# Patient Record
Sex: Female | Born: 1985 | Race: Asian | Hispanic: No | Marital: Married | Smoking: Never smoker
Health system: Southern US, Community
[De-identification: ages and names within clinical notes are randomized; demographics above are authoritative.]

## PROBLEM LIST (undated history)

## (undated) ENCOUNTER — Inpatient Hospital Stay (HOSPITAL_COMMUNITY): Payer: Self-pay

## (undated) DIAGNOSIS — H04123 Dry eye syndrome of bilateral lacrimal glands: Secondary | ICD-10-CM

## (undated) DIAGNOSIS — R002 Palpitations: Secondary | ICD-10-CM

## (undated) DIAGNOSIS — F419 Anxiety disorder, unspecified: Secondary | ICD-10-CM

## (undated) DIAGNOSIS — K824 Cholesterolosis of gallbladder: Secondary | ICD-10-CM

## (undated) DIAGNOSIS — K219 Gastro-esophageal reflux disease without esophagitis: Secondary | ICD-10-CM

## (undated) DIAGNOSIS — T7840XA Allergy, unspecified, initial encounter: Secondary | ICD-10-CM

## (undated) DIAGNOSIS — R519 Headache, unspecified: Secondary | ICD-10-CM

## (undated) DIAGNOSIS — Z889 Allergy status to unspecified drugs, medicaments and biological substances status: Secondary | ICD-10-CM

## (undated) DIAGNOSIS — R51 Headache: Secondary | ICD-10-CM

## (undated) HISTORY — DX: Dry eye syndrome of bilateral lacrimal glands: H04.123

## (undated) HISTORY — DX: Palpitations: R00.2

## (undated) HISTORY — DX: Cholesterolosis of gallbladder: K82.4

## (undated) HISTORY — DX: Allergy, unspecified, initial encounter: T78.40XA

## (undated) HISTORY — DX: Gastro-esophageal reflux disease without esophagitis: K21.9

## (undated) HISTORY — DX: Anxiety disorder, unspecified: F41.9

## (undated) HISTORY — PX: NO PAST SURGERIES: SHX2092

## (undated) HISTORY — DX: Headache, unspecified: R51.9

## (undated) HISTORY — DX: Headache: R51

## (undated) HISTORY — PX: UPPER GASTROINTESTINAL ENDOSCOPY: SHX188

---

## 2012-03-20 NOTE — L&D Delivery Note (Signed)
Delivery Note  After pt rcv'd epidural, continued to c/o some pain and pressure, FHR mild early variables noted, cat 2, cervix complete and vtx +2, pt pushed well for about 15 min  At 10:53 PM a viable female was delivered via Vaginal, Spontaneous Delivery (Presentation: ; Occiput Anterior).  Delivered through loose nuchal cord, APGAR: 8, 9; weight 7 lb 1.6 oz (3221 g).   Placenta status: Intact, Spontaneous.  Cord: 3 vessels with the following complications: None.  Cord pH: n/a   Anesthesia: Epidural  Episiotomy: None Lacerations: 2nd degree Suture Repair: 3.0 vicryl rapide 4-0 monocryl  Est. Blood Loss (mL): 300  Mom to postpartum.  Baby to Couplet care / Skin to Skin. Pt plans to breast and bottle feed Declines circumcision at this time Routine PP orders   Vallory Oetken M 02/27/2013, 1:45 AM

## 2012-07-24 ENCOUNTER — Encounter (HOSPITAL_COMMUNITY): Payer: Self-pay | Admitting: *Deleted

## 2012-07-24 ENCOUNTER — Inpatient Hospital Stay (HOSPITAL_COMMUNITY)
Admission: AD | Admit: 2012-07-24 | Discharge: 2012-07-24 | Disposition: A | Discharge status: Home / Self Care | Payer: BC Managed Care – PPO | Source: Ambulatory Visit | Attending: Obstetrics & Gynecology | Admitting: Obstetrics & Gynecology

## 2012-07-24 ENCOUNTER — Inpatient Hospital Stay (HOSPITAL_COMMUNITY): Payer: BC Managed Care – PPO

## 2012-07-24 DIAGNOSIS — R109 Unspecified abdominal pain: Secondary | ICD-10-CM | POA: Insufficient documentation

## 2012-07-24 DIAGNOSIS — O209 Hemorrhage in early pregnancy, unspecified: Secondary | ICD-10-CM | POA: Insufficient documentation

## 2012-07-24 DIAGNOSIS — O418X10 Other specified disorders of amniotic fluid and membranes, first trimester, not applicable or unspecified: Secondary | ICD-10-CM

## 2012-07-24 DIAGNOSIS — O26859 Spotting complicating pregnancy, unspecified trimester: Secondary | ICD-10-CM

## 2012-07-24 DIAGNOSIS — R1084 Generalized abdominal pain: Secondary | ICD-10-CM

## 2012-07-24 DIAGNOSIS — Z3490 Encounter for supervision of normal pregnancy, unspecified, unspecified trimester: Secondary | ICD-10-CM

## 2012-07-24 HISTORY — DX: Allergy status to unspecified drugs, medicaments and biological substances: Z88.9

## 2012-07-24 LAB — URINALYSIS, ROUTINE W REFLEX MICROSCOPIC
Bilirubin Urine: NEGATIVE
Nitrite: NEGATIVE
Protein, ur: NEGATIVE mg/dL
Specific Gravity, Urine: 1.02 (ref 1.005–1.030)
Urobilinogen, UA: 1 mg/dL (ref 0.0–1.0)

## 2012-07-24 LAB — CBC
Hemoglobin: 12.7 g/dL (ref 12.0–15.0)
MCH: 29.4 pg (ref 26.0–34.0)
Platelets: 266 10*3/uL (ref 150–400)
RBC: 4.32 MIL/uL (ref 3.87–5.11)
WBC: 9.7 10*3/uL (ref 4.0–10.5)

## 2012-07-24 LAB — WET PREP, GENITAL: Yeast Wet Prep HPF POC: NONE SEEN

## 2012-07-24 LAB — HCG, QUANTITATIVE, PREGNANCY: hCG, Beta Chain, Quant, S: 100868 m[IU]/mL — ABNORMAL HIGH (ref <5)

## 2012-07-24 LAB — ABO/RH: ABO/RH(D): AB POS

## 2012-07-24 NOTE — MAU Note (Signed)
No period for 2 months. Past 3-4 days, having lower abdominal cramping and very light bleeding.

## 2012-07-24 NOTE — MAU Provider Note (Signed)
History     CSN: [id number]  Arrival date and time: 07/24/12 7829   First Provider Initiated Contact with Patient 07/24/12 [phone]      Chief Complaint  Patient presents with  . Abdominal Cramping  . Vaginal Bleeding   HPI [name] is a 27 y.o. G2P0010 at [redacted]w[redacted]d who presents to MAU today with complaint of vaginal bleeding and lower abdominal cramping. The patient states that she has been having spotting and lower abdominal cramping x 3-4 days. She is also having a lot of thin, white discharge. She denies any other problems.   OB History   Grav Para Term Preterm Abortions TAB SAB Ect Mult Living   2    1  1    0      Past Medical History  Diagnosis Date  . H/O seasonal allergies     History reviewed. No pertinent past surgical history.  History reviewed. No pertinent family history.  History  Substance Use Topics  . Smoking status: Never Smoker   . Smokeless tobacco: Never Used  . Alcohol Use: Yes     Comment: occas    Allergies: Allergies not on file  No prescriptions prior to admission    Review of Systems  Constitutional: Negative for fever.  Gastrointestinal: Positive for abdominal pain. Negative for nausea and vomiting.  Genitourinary:       + vaginal bleeding, discharge   Physical Exam   Blood pressure 109/59, pulse 82, temperature 97.6 F (36.4 C), temperature source Oral, resp. rate 20, height 5' (1.524 m), weight 136 lb (61.689 kg), last menstrual period 05/21/2012.  Physical Exam  Constitutional: She is oriented to person, place, and time. She appears well-developed and well-nourished. No distress.  HENT:  Head: Normocephalic and atraumatic.  Cardiovascular: Normal rate.   Respiratory: Effort normal.  GI: Soft. She exhibits no distension and no mass. There is no tenderness. There is no rebound and no guarding.  Genitourinary: There is no rash or tenderness on the right labia. There is no rash or tenderness on the left labia. Uterus is  enlarged (appropriate for GA). Uterus is not tender. Cervix exhibits discharge (thin, white discharge noted at the cervical os). Cervix exhibits no motion tenderness and no friability. Right adnexum displays no mass and no tenderness. Left adnexum displays no mass and no tenderness. No bleeding around the vagina. Vaginal discharge (moderate amount of thin, white discharge noted in the vaginal vault) found.  Neurological: She is alert and oriented to person, place, and time.  Skin: Skin is warm and dry. No erythema.  Psychiatric: She has a normal mood and affect.   Results for orders placed during the hospital encounter of 07/24/12 (from the past 24 hour(s))  URINALYSIS, ROUTINE W REFLEX MICROSCOPIC     Status: None   Collection Time    07/24/12  8:46 AM      Result Value Range   Color, Urine YELLOW  YELLOW   APPearance CLEAR  CLEAR   Specific Gravity, Urine 1.020  1.005 - 1.030   pH 8.0  5.0 - 8.0   Glucose, UA NEGATIVE  NEGATIVE mg/dL   Hgb urine dipstick NEGATIVE  NEGATIVE   Bilirubin Urine NEGATIVE  NEGATIVE   Ketones, ur NEGATIVE  NEGATIVE mg/dL   Protein, ur NEGATIVE  NEGATIVE mg/dL   Urobilinogen, UA 1.0  0.0 - 1.0 mg/dL   Nitrite NEGATIVE  NEGATIVE   Leukocytes, UA NEGATIVE  NEGATIVE  POCT PREGNANCY, URINE  Status: Abnormal   Collection Time    07/24/12  8:53 AM      Result Value Range   Preg Test, Ur POSITIVE (*) NEGATIVE  WET PREP, GENITAL     Status: Abnormal   Collection Time    07/24/12  9:15 AM      Result Value Range   Yeast Wet Prep HPF POC NONE SEEN  NONE SEEN   Trich, Wet Prep NONE SEEN  NONE SEEN   Clue Cells Wet Prep HPF POC NONE SEEN  NONE SEEN   WBC, Wet Prep HPF POC FEW (*) NONE SEEN  CBC     Status: None   Collection Time    07/24/12  9:15 AM      Result Value Range   WBC 9.7  4.0 - 10.5 K/uL   RBC 4.32  3.87 - 5.11 MIL/uL   Hemoglobin 12.7  12.0 - 15.0 g/dL   HCT 45.4  09.8 - 11.9 %   MCV 86.3  78.0 - 100.0 fL   MCH 29.4  26.0 - 34.0 pg   MCHC  34.0  30.0 - 36.0 g/dL   RDW 14.7  82.9 - 56.2 %   Platelets 266  150 - 400 K/uL  ABO/RH     Status: None   Collection Time    07/24/12  9:15 AM      Result Value Range   ABO/RH(D) AB POS    HCG, QUANTITATIVE, PREGNANCY     Status: Abnormal   Collection Time    07/24/12  9:15 AM      Result Value Range   hCG, Beta Chain, Quant, S 130865 (*) <5 mIU/mL    MAU Course  Procedures None  MDM Wet prep, GC/Chlamydia, CBC, ABO/Rh, quant hCG and [place] today  Assessment and Plan  A: IUP at 9w 1 d Small subchorionic hemorrhage  P: Discharge home Advised patient to start taking prenatal vitamins Patient plans to start prenatal care with [facility] soon. Will call and make an appointment today Patient may return to MAU as needed or if her condition were to change or worsen  Freddi Starr, PA-C  07/24/2012, 10:20 AM

## 2012-07-25 LAB — GC/CHLAMYDIA PROBE AMP: CT Probe RNA: NEGATIVE

## 2012-08-05 LAB — OB RESULTS CONSOLE ABO/RH: RH Type: POSITIVE

## 2012-08-05 LAB — OB RESULTS CONSOLE RPR: RPR: NONREACTIVE

## 2012-08-05 LAB — OB RESULTS CONSOLE HIV ANTIBODY (ROUTINE TESTING): HIV: NONREACTIVE

## 2012-08-05 LAB — OB RESULTS CONSOLE GC/CHLAMYDIA
Chlamydia: NEGATIVE
Gonorrhea: NEGATIVE

## 2013-02-03 LAB — OB RESULTS CONSOLE GBS: GBS: NEGATIVE

## 2013-02-26 ENCOUNTER — Inpatient Hospital Stay (HOSPITAL_COMMUNITY)
Admission: AD | Admit: 2013-02-26 | Discharge: 2013-02-28 | DRG: 775 | Disposition: A | Discharge status: Home / Self Care | Payer: BC Managed Care – PPO | Source: Ambulatory Visit | Attending: Obstetrics and Gynecology | Admitting: Obstetrics and Gynecology

## 2013-02-26 ENCOUNTER — Inpatient Hospital Stay (HOSPITAL_COMMUNITY): Payer: BC Managed Care – PPO

## 2013-02-26 ENCOUNTER — Inpatient Hospital Stay (HOSPITAL_COMMUNITY): Payer: BC Managed Care – PPO | Admitting: Anesthesiology

## 2013-02-26 ENCOUNTER — Encounter (HOSPITAL_COMMUNITY): Payer: Self-pay | Admitting: *Deleted

## 2013-02-26 ENCOUNTER — Encounter (HOSPITAL_COMMUNITY): Payer: BC Managed Care – PPO | Admitting: Anesthesiology

## 2013-02-26 LAB — CBC
HCT: 37.2 % (ref 36.0–46.0)
Hemoglobin: 12.5 g/dL (ref 12.0–15.0)
MCV: 85.7 fL (ref 78.0–100.0)
Platelets: 242 10*3/uL (ref 150–400)
RDW: 13.4 % (ref 11.5–15.5)
WBC: 13.9 10*3/uL — ABNORMAL HIGH (ref 4.0–10.5)

## 2013-02-26 LAB — AMNISURE RUPTURE OF MEMBRANE (ROM) NOT AT ARMC: Amnisure ROM: POSITIVE

## 2013-02-26 LAB — TYPE AND SCREEN: ABO/RH(D): AB POS

## 2013-02-26 MED ORDER — OXYCODONE-ACETAMINOPHEN 5-325 MG PO TABS: 1.0000 | ORAL_TABLET | ORAL | Status: DC | PRN

## 2013-02-26 MED ORDER — EPHEDRINE 5 MG/ML INJ
10.0000 mg | INTRAVENOUS | Status: DC | PRN
  Filled 2013-02-26: qty 2

## 2013-02-26 MED ORDER — OXYTOCIN BOLUS FROM INFUSION
500.0000 mL | INTRAVENOUS | Status: DC
  Administered 2013-02-26: 500 mL via INTRAVENOUS

## 2013-02-26 MED ORDER — LACTATED RINGERS IV SOLN
INTRAVENOUS | Status: DC
  Administered 2013-02-26 (×2): via INTRAVENOUS

## 2013-02-26 MED ORDER — LACTATED RINGERS IV SOLN: 500.0000 mL | INTRAVENOUS | Status: DC | PRN

## 2013-02-26 MED ORDER — OXYTOCIN 40 UNITS IN LACTATED RINGERS INFUSION - SIMPLE MED: 62.5000 mL/h | INTRAVENOUS | Status: DC

## 2013-02-26 MED ORDER — EPHEDRINE 5 MG/ML INJ
10.0000 mg | INTRAVENOUS | Status: DC | PRN
  Filled 2013-02-26: qty 4
  Filled 2013-02-26: qty 2

## 2013-02-26 MED ORDER — OXYTOCIN 40 UNITS IN LACTATED RINGERS INFUSION - SIMPLE MED
1.0000 m[IU]/min | INTRAVENOUS | Status: DC
  Administered 2013-02-26: 9 m[IU]/min via INTRAVENOUS
  Administered 2013-02-26: 1 m[IU]/min via INTRAVENOUS
  Administered 2013-02-26: 7 m[IU]/min via INTRAVENOUS
  Filled 2013-02-26: qty 1000

## 2013-02-26 MED ORDER — PHENYLEPHRINE 40 MCG/ML (10ML) SYRINGE FOR IV PUSH (FOR BLOOD PRESSURE SUPPORT)
80.0000 ug | PREFILLED_SYRINGE | INTRAVENOUS | Status: DC | PRN
  Filled 2013-02-26: qty 10
  Filled 2013-02-26: qty 2

## 2013-02-26 MED ORDER — CITRIC ACID-SODIUM CITRATE 334-500 MG/5ML PO SOLN: 30.0000 mL | ORAL | Status: DC | PRN

## 2013-02-26 MED ORDER — BUTORPHANOL TARTRATE 1 MG/ML IJ SOLN
1.0000 mg | INTRAMUSCULAR | Status: DC | PRN
  Administered 2013-02-26: 1 mg via INTRAVENOUS
  Filled 2013-02-26: qty 1

## 2013-02-26 MED ORDER — ONDANSETRON HCL 4 MG/2ML IJ SOLN: 4.0000 mg | Freq: Four times a day (QID) | INTRAMUSCULAR | Status: DC | PRN

## 2013-02-26 MED ORDER — ACETAMINOPHEN 325 MG PO TABS: 650.0000 mg | ORAL_TABLET | ORAL | Status: DC | PRN

## 2013-02-26 MED ORDER — LIDOCAINE HCL (PF) 1 % IJ SOLN
INTRAMUSCULAR | Status: DC | PRN
  Administered 2013-02-26 (×4): 4 mL

## 2013-02-26 MED ORDER — PHENYLEPHRINE 40 MCG/ML (10ML) SYRINGE FOR IV PUSH (FOR BLOOD PRESSURE SUPPORT)
80.0000 ug | PREFILLED_SYRINGE | INTRAVENOUS | Status: DC | PRN
  Filled 2013-02-26: qty 2

## 2013-02-26 MED ORDER — LACTATED RINGERS IV SOLN
500.0000 mL | Freq: Once | INTRAVENOUS | Status: AC
  Administered 2013-02-26: 500 mL via INTRAVENOUS

## 2013-02-26 MED ORDER — LIDOCAINE HCL (PF) 1 % IJ SOLN
30.0000 mL | INTRAMUSCULAR | Status: DC | PRN
  Administered 2013-02-26: 30 mL via SUBCUTANEOUS
  Filled 2013-02-26 (×2): qty 30

## 2013-02-26 MED ORDER — IBUPROFEN 600 MG PO TABS: 600.0000 mg | ORAL_TABLET | Freq: Four times a day (QID) | ORAL | Status: DC | PRN

## 2013-02-26 MED ORDER — DIPHENHYDRAMINE HCL 50 MG/ML IJ SOLN: 12.5000 mg | INTRAMUSCULAR | Status: DC | PRN

## 2013-02-26 MED ORDER — FENTANYL 2.5 MCG/ML BUPIVACAINE 1/10 % EPIDURAL INFUSION (WH - ANES)
14.0000 mL/h | INTRAMUSCULAR | Status: DC | PRN
  Administered 2013-02-26: 14 mL/h via EPIDURAL
  Filled 2013-02-26: qty 125

## 2013-02-26 MED ORDER — TERBUTALINE SULFATE 1 MG/ML IJ SOLN: 0.2500 mg | Freq: Once | INTRAMUSCULAR | Status: AC | PRN

## 2013-02-26 NOTE — H&P (Signed)
Monica Steele is a 27 y.o. female presenting for bleeding and contractions. History   HPI Pt presents c/o bleeding and  Some contractions.  She was 1-2cm when checked by RN in MAU.  Pt was sent walking for 2 hrs and to be rechecked.  Upon her return she thought she had been leaking fluid.  Good FM reported.  OB History   Grav Para Term Preterm Abortions TAB SAB Ect Mult Living   2    1  1    0      Past Medical History  Diagnosis Date  . H/O seasonal allergies     History reviewed. No pertinent past surgical history.  History reviewed. No pertinent family history.  History  Substance Use Topics  . Smoking status: Never Smoker   . Smokeless tobacco: Never Used  . Alcohol Use: Yes     Comment: occas    Allergies: No Known Allergies  Prescriptions prior to admission  Medication Sig Dispense Refill  . Prenatal Vit-Fe Fumarate-FA (PRENATAL MULTIVITAMIN) TABS tablet Take 1 tablet by mouth daily at 12 noon.        OB History   Grav Para Term Preterm Abortions TAB SAB Ect Mult Living   2    1  1    0     Past Medical History  Diagnosis Date  . H/O seasonal allergies    History reviewed. No pertinent past surgical history. Family History: family history is not on file. Social History:  reports that she has never smoked. She has never used smokeless tobacco. She reports that she drinks alcohol. She reports that she does not use illicit drugs.   Prenatal Transfer Tool  Maternal Diabetes: No Genetic Screening: Normal Maternal Ultrasounds/Referrals: Normal Fetal Ultrasounds or other Referrals:  None Maternal Substance Abuse:  No Significant Maternal Medications:  None Significant Maternal Lab Results:  Lab values include: Group B Strep negative Other Comments:  None  ROS Non-contributory  Dilation: 1.5 Effacement (%): 60 Station: -3 Exam by:: Dr. Su Hilt Blood pressure 118/69, pulse 75, temperature 98.2 F (36.8 C), temperature source Oral, resp. rate 18, height  5' 0.5" (1.537 m), weight 72.576 kg (160 lb), last menstrual period 05/21/2012. Exam Physical Exam   Lungs CTA CV RRR Ext no calf tenderness Amnisure positive  Prenatal labs: ABO, Rh: --/--/AB POS (05/07 0915) Antibody:   Rubella:  immune RPR:   NR HBsAg:   neg HIV:   neg GBS:   neg  Assessment/Plan: P0 at 40 1/7wks with SROM and early labor.  Will admit with routine orders. Fetal status is reassuring.  U/S still pending.   Monica Steele Y 02/26/2013, 1:34 PM

## 2013-02-26 NOTE — Anesthesia Procedure Notes (Signed)
Epidural Patient location during procedure: OB Start time: 02/26/2013 9:44 PM  Staffing Performed by: anesthesiologist   Preanesthetic Checklist Completed: patient identified, site marked, surgical consent, pre-op evaluation, timeout performed, IV checked, risks and benefits discussed and monitors and equipment checked  Epidural Patient position: sitting Prep: site prepped and draped and DuraPrep Patient monitoring: continuous pulse ox and blood pressure Approach: midline Injection technique: LOR air  Needle:  Needle type: Tuohy  Needle gauge: 17 G Needle length: 9 cm and 9 Needle insertion depth: 5 cm cm Catheter type: closed end flexible Catheter size: 19 Gauge Catheter at skin depth: 10 cm Test dose: negative  Assessment Events: blood not aspirated, injection not painful, no injection resistance, negative IV test and no paresthesia  Additional Notes Discussed risk of headache, infection, bleeding, nerve injury and failed or incomplete block.  Patient voices understanding and wishes to proceed.  Epidural placed easily on first attempt.  No paresthesia.  Patient tolerated procedure well with no apparent complications.  Jasmine December, MDReason for block:procedure for pain

## 2013-02-26 NOTE — MAU Note (Addendum)
States noted some bleeding this AM.States was like a period. Denies recent exam or intercourse.

## 2013-02-26 NOTE — Progress Notes (Signed)
Monica Steele is a 27 y.o. G2P0010 at [redacted]w[redacted]d admitted for rupture of membranes in early labor  Subjective: Pt fairly comfortable in appearance.  Slight language barrier but FOB speaks english.  Objective: BP 118/69  Pulse 75  Temp(Src) 98.4 F (36.9 C) (Oral)  Resp 18  Ht 5' 0.5" (1.537 m)  Wt 72.576 kg (160 lb)  BMI 30.72 kg/m2  LMP 05/21/2012      FHT:  FHR: 130s-140s bpm, variability: moderate,  accelerations:  Present,  decelerations:  Absent UC:   regular, every 5-6 minutes SVE:   Dilation: 2 Effacement (%): 80 Station: -2 Exam by:: Dr.Maeley Matton  Labs: Lab Results  Component Value Date   WBC 13.9* 02/26/2013   HGB 12.5 02/26/2013   HCT 37.2 02/26/2013   MCV 85.7 02/26/2013   PLT 242 02/26/2013    Assessment / Plan: SROM in early labor  Labor: Augment with pitocin.  Discussed with patient and she is agreeable. Preeclampsia:  no signs or symptoms of toxicity Fetal Wellbeing:  Category I Pain Control:  Labor support without medications.  Pain med upon request. I/D:  GBS neg, no signs sxs of infection. Anticipated MOD:  NSVD  Aleighya Mcanelly Y 02/26/2013, 3:50 PM

## 2013-02-26 NOTE — MAU Provider Note (Signed)
  History     CSN: [id number]  Arrival date and time: 02/26/13 4098   None     Chief Complaint  Patient presents with  . Vaginal Bleeding  . Abdominal Pain   HPI Pt presents c/o bleeding and  Some contractions.  She was 1-2cm when checked by RN in MAU.  Pt was sent walking for 2 hrs and to be rechecked.  Upon her return she thought she had been leaking fluid.  Good FM reported.  OB History   Grav Para Term Preterm Abortions TAB SAB Ect Mult Living   2    1  1    0      Past Medical History  Diagnosis Date  . H/O seasonal allergies     History reviewed. No pertinent past surgical history.  History reviewed. No pertinent family history.  History  Substance Use Topics  . Smoking status: Never Smoker   . Smokeless tobacco: Never Used  . Alcohol Use: Yes     Comment: occas    Allergies: No Known Allergies  Prescriptions prior to admission  Medication Sig Dispense Refill  . Prenatal Vit-Fe Fumarate-FA (PRENATAL MULTIVITAMIN) TABS tablet Take 1 tablet by mouth daily at 12 noon.        ROS Physical Exam   Blood pressure 118/69, pulse 75, temperature 98.2 F (36.8 C), temperature source Oral, resp. rate 18, height 5' 0.5" (1.537 m), weight 72.576 kg (160 lb), last menstrual period 05/21/2012.  Physical Exam  ve 1-2/50/-3/vtx FHT 130s-140s cat 1 Toco q5-58min Amnisure done  MAU Course  Procedures Amnisure positive U/S pending   Assessment and Plan  P0 at 40 1/7wks with SROM and early labor.  Will admit with routine orders.   Letrell Attwood Y 02/26/2013, 1:27 PM

## 2013-02-26 NOTE — Anesthesia Preprocedure Evaluation (Signed)

## 2013-02-27 ENCOUNTER — Encounter (HOSPITAL_COMMUNITY): Payer: Self-pay | Admitting: *Deleted

## 2013-02-27 LAB — CBC
HCT: 35 % — ABNORMAL LOW (ref 36.0–46.0)
Hemoglobin: 11.7 g/dL — ABNORMAL LOW (ref 12.0–15.0)
MCHC: 33.4 g/dL (ref 30.0–36.0)
Platelets: 230 10*3/uL (ref 150–400)
RDW: 13.4 % (ref 11.5–15.5)

## 2013-02-27 MED ORDER — BISACODYL 10 MG RE SUPP: 10.0000 mg | Freq: Every day | RECTAL | Status: DC | PRN

## 2013-02-27 MED ORDER — IBUPROFEN 600 MG PO TABS
600.0000 mg | ORAL_TABLET | Freq: Four times a day (QID) | ORAL | Status: DC
  Administered 2013-02-27 – 2013-02-28 (×6): 600 mg via ORAL
  Filled 2013-02-27 (×6): qty 1

## 2013-02-27 MED ORDER — ZOLPIDEM TARTRATE 5 MG PO TABS: 5.0000 mg | ORAL_TABLET | Freq: Every evening | ORAL | Status: DC | PRN

## 2013-02-27 MED ORDER — FLEET ENEMA 7-19 GM/118ML RE ENEM: 1.0000 | ENEMA | Freq: Every day | RECTAL | Status: DC | PRN

## 2013-02-27 MED ORDER — LANOLIN HYDROUS EX OINT: TOPICAL_OINTMENT | CUTANEOUS | Status: DC | PRN

## 2013-02-27 MED ORDER — SENNOSIDES-DOCUSATE SODIUM 8.6-50 MG PO TABS
2.0000 | ORAL_TABLET | ORAL | Status: DC
  Administered 2013-02-28: 2 via ORAL
  Filled 2013-02-27: qty 2

## 2013-02-27 MED ORDER — SIMETHICONE 80 MG PO CHEW: 80.0000 mg | CHEWABLE_TABLET | ORAL | Status: DC | PRN

## 2013-02-27 MED ORDER — WITCH HAZEL-GLYCERIN EX PADS: 1.0000 "application " | MEDICATED_PAD | CUTANEOUS | Status: DC | PRN

## 2013-02-27 MED ORDER — ONDANSETRON HCL 4 MG/2ML IJ SOLN: 4.0000 mg | INTRAMUSCULAR | Status: DC | PRN

## 2013-02-27 MED ORDER — OXYCODONE-ACETAMINOPHEN 5-325 MG PO TABS: 1.0000 | ORAL_TABLET | ORAL | Status: DC | PRN

## 2013-02-27 MED ORDER — MEDROXYPROGESTERONE ACETATE 150 MG/ML IM SUSP: 150.0000 mg | INTRAMUSCULAR | Status: DC | PRN

## 2013-02-27 MED ORDER — PRENATAL MULTIVITAMIN CH
1.0000 | ORAL_TABLET | Freq: Every day | ORAL | Status: DC
  Administered 2013-02-28: 1 via ORAL

## 2013-02-27 MED ORDER — BENZOCAINE-MENTHOL 20-0.5 % EX AERO
1.0000 "application " | INHALATION_SPRAY | CUTANEOUS | Status: DC | PRN
  Administered 2013-02-27: 1 via TOPICAL
  Filled 2013-02-27: qty 56

## 2013-02-27 MED ORDER — ONDANSETRON HCL 4 MG PO TABS: 4.0000 mg | ORAL_TABLET | ORAL | Status: DC | PRN

## 2013-02-27 MED ORDER — DIBUCAINE 1 % RE OINT: 1.0000 "application " | TOPICAL_OINTMENT | RECTAL | Status: DC | PRN

## 2013-02-27 MED ORDER — DIPHENHYDRAMINE HCL 25 MG PO CAPS: 25.0000 mg | ORAL_CAPSULE | Freq: Four times a day (QID) | ORAL | Status: DC | PRN

## 2013-02-27 NOTE — Lactation Note (Signed)
This note was copied from the chart of Monica Steele. Lactation Consultation Note Initial consultation; mom's personal interpreter in room for consult. Mom states baby has had one feeding since born, for about an hour.  Reviewed br feeding basics with mom; demonstrate hand expression with mom return demo. Offered to assist with a feeding, mom accepts. Mom prefers cradle over football; demonstrated how to hold using cross cradle. Assisted mom to get baby in cross cradle on left side. Baby latched well with occasional audible swallows for about 10 minutes then was asleep.  Enc mom to continue frequent STS and cue based feeding. Mom declines STS at this time. Baby swaddled and placed back in the bassinet at mom's request. Mom made aware of lactation services and community resources.  Enc mom to call for assistance if needed.   Patient Name: Monica Merissa Renwick [id number] Date: 02/27/2013 Reason for consult: Initial assessment   Maternal Data Formula Feeding for Exclusion: Yes Reason for exclusion: Mother's choice to formula and breast feed on admission Has patient been taught Hand Expression?: Yes Does the patient have breastfeeding experience prior to this delivery?: No  Feeding Feeding Type: Breast Fed Length of feed: 10 min  LATCH Score/Interventions Latch: Grasps breast easily, tongue down, lips flanged, rhythmical sucking. Intervention(s): Skin to skin;Teach feeding cues  Audible Swallowing: A few with stimulation Intervention(s): Skin to skin Intervention(s): Skin to skin  Type of Nipple: Everted at rest and after stimulation  Comfort (Breast/Nipple): Soft / non-tender     Hold (Positioning): Assistance needed to correctly position infant at breast and maintain latch. Intervention(s): Support Pillows;Position options;Skin to skin  LATCH Score: 8  Lactation Tools Discussed/Used     Consult Status Consult Status: Follow-up Follow-up type: In-patient    Octavio Manns [facility] 02/27/2013, 12:19 PM

## 2013-02-27 NOTE — Anesthesia Postprocedure Evaluation (Signed)
  Anesthesia Post-op Note  Patient: Monica Steele  Procedure(s) Performed: * No procedures listed *  Patient Location: Mother/Baby  Anesthesia Type:Epidural  Level of Consciousness: awake, alert  and oriented  Airway and Oxygen Therapy: Patient Spontanous Breathing  Post-op Pain: none  Post-op Assessment: Post-op Vital signs reviewed and Patient's Cardiovascular Status Stable  Post-op Vital Signs: Reviewed and stable  Complications: No apparent anesthesia complications

## 2013-02-27 NOTE — Progress Notes (Signed)
Patient ID: Monica Steele, female   DOB: 1985/08/07, 27 y.o.   MRN: [id number] Monica Steele is a 27 y.o. G2P1011 at [redacted]w[redacted]d admitted for SROM  Subjective: Just rcv'd stadol IV, sleepy, helped some, family at [facility]   Objective: BP 118/65  Pulse 99  Temp(Src) 98.3 F (36.8 C) (Oral)  Resp 18  Ht 5' 0.5" (1.537 m)  Wt 160 lb (72.576 kg)  BMI 30.72 kg/m2  SpO2 98%  LMP 05/21/2012  Total I/O In: -  Out: 650 [Urine:350; Blood:300]  FHT:  Cat 1 UC:   toco 2-3, some coupling   SVE:   Deferred    Assessment / Plan:  Labor: latent labor, pitocin augmentation in progress Preeclampsia:  no s/s Fetal Wellbeing:  Category I Pain Control:  stadol Anticipated MOD:  NSVD  Continue pitocin, consider IUPC    Update physician PRN   Callaway Hailes M 02/27/2013, 27:42 AM

## 2013-02-27 NOTE — Progress Notes (Signed)
Post Partum Day day 1 Subjective: no complaints, up ad lib, voiding and tolerating PO  Objective: Blood pressure 107/71, pulse 84, temperature 98.7 F (37.1 C), temperature source Oral, resp. rate 18, height 5' 0.5" (1.537 m), weight 160 lb (72.576 kg), last menstrual period 05/21/2012, SpO2 98.00%, unknown if currently breastfeeding.  Physical Exam:  General: alert, cooperative, appears stated age, no distress and Limited English ability Lochia: appropriate Uterine Fundus: firm Incision: Not applicable DVT Evaluation: No evidence of DVT seen on physical exam.   Recent Labs  02/26/13 1450 02/27/13 0609  HGB 12.5 11.7*  HCT 37.2 35.0*    Assessment/Plan: Plan for discharge tomorrow, Breastfeeding and Contraception Undecided   LOS: 1 day   Anique Beckley P 02/27/2013, 3:41 PM   1

## 2013-02-28 MED ORDER — IBUPROFEN 600 MG PO TABS: 600.0000 mg | ORAL_TABLET | Freq: Four times a day (QID) | ORAL | Status: DC

## 2013-02-28 NOTE — Discharge Summary (Signed)
Vaginal Delivery Discharge Summary  Monica Steele  DOB:    Dec 06, 1985 MRN:    [id number] CSN:    [id number]  Date of admission:                  02/26/13  Date of discharge:                   02/28/13  Procedures this admission: SVD with a 2nd degree laceration  Date of Delivery: 02/26/13 by Sanda Klein  Newborn Data:  Live born female  Birth Weight: 7 lb 1.6 oz (3221 g) APGAR: 8, 9  Home with mother. Circumcision Plan: Declines  History of Present Illness:  Monica Steele is a 27 y.o. female, G2P1011, who presents at [redacted]w[redacted]d weeks gestation. The patient has been followed at the [facility] and Gynecology division of [facility]. She was admitted onset of labor and rupture of membranes. Her pregnancy has been complicated by:   Patient Active Problem List   Diagnosis Date Noted  . NSVD (normal spontaneous vaginal delivery) 02/28/2013  . Second-degree perineal laceration, with delivery 02/28/2013     Hospital course:  The patient was admitted for early labor and SROM.   Her labor was not complicated. She proceeded to have a vaginal delivery of a healthy infant. Her delivery was not complicated. Her postpartum course was not complicated.  She was discharged to home on postpartum day 2 doing well.  Feeding:  breast and bottle  Contraception:  no method - Husband is out of the country. Will think about it and will discuss at [facility] appointment.  Offered to answer questions, pt had no questions.  Discharge hemoglobin:  Hemoglobin  Date Value Range Status  02/27/2013 11.7* 12.0 - 15.0 g/dL Final     HCT  Date Value Range Status  02/27/2013 35.0* 36.0 - 46.0 % Final    Discharge Physical Exam:   General: alert, cooperative and no distress Lochia: appropriate Uterine Fundus: firm Incision: healing well DVT Evaluation: No evidence of DVT seen on physical exam. Negative Homan's sign.  Intrapartum Procedures: spontaneous  vaginal delivery Postpartum Procedures: none Complications-Operative and Postpartum: 2nd degree perineal laceration  Discharge Diagnoses: Term Pregnancy-delivered  Discharge Information:  Activity:           pelvic rest Diet:                routine Medications: PNV and Ibuprofen Condition:      stable Instructions:   Postpartum Care After Vaginal Delivery  After you deliver your newborn (postpartum period), the usual stay in the hospital is 24 72 hours. If there were problems with your labor or delivery, or if you have other medical problems, you might be in the hospital longer.  While you are in the hospital, you will receive help and instructions on how to care for yourself and your newborn during the postpartum period.  While you are in the hospital:  Be sure to tell your nurses if you have pain or discomfort, as well as where you feel the pain and what makes the pain worse.  If you had an incision made near your vagina (episiotomy) or if you had some tearing during delivery, the nurses may put ice packs on your episiotomy or tear. The ice packs may help to reduce the pain and swelling.  If you are breastfeeding, you may feel uncomfortable contractions of your uterus for a couple of weeks. This is normal. The  contractions help your uterus get back to normal size.  It is normal to have some bleeding after delivery.  For the first 1 3 days after delivery, the flow is red and the amount may be similar to a period.  It is common for the flow to start and stop.  In the first few days, you may pass some small clots. Let your nurses know if you begin to pass large clots or your flow increases.  Do not  flush blood clots down the toilet before having the nurse look at them.  During the next 3 10 days after delivery, your flow should become more watery and pink or brown-tinged in color.  Ten to fourteen days after delivery, your flow should be a small amount of yellowish-white  discharge.  The amount of your flow will decrease over the first few weeks after delivery. Your flow may stop in 6 8 weeks. Most women have had their flow stop by 12 weeks after delivery.  You should change your sanitary pads frequently.  Wash your hands thoroughly with soap and water for at least 20 seconds after changing pads, using the toilet, or before holding or feeding your newborn.  You should feel like you need to empty your bladder within the first 6 8 hours after delivery.  In case you become weak, lightheaded, or faint, call your nurse before you get out of bed for the first time and before you take a shower for the first time.  Within the first few days after delivery, your breasts may begin to feel tender and full. This is called engorgement. Breast tenderness usually goes away within 48 72 hours after engorgement occurs. You may also notice milk leaking from your breasts. If you are not breastfeeding, do not stimulate your breasts. Breast stimulation can make your breasts produce more milk.  Spending as much time as possible with your newborn is very important. During this time, you and your newborn can feel close and get to know each other. Having your newborn stay in your room (rooming in) will help to strengthen the bond with your newborn. It will give you time to get to know your newborn and become comfortable caring for your newborn.  Your hormones change after delivery. Sometimes the hormone changes can temporarily cause you to feel sad or tearful. These feelings should not last more than a few days. If these feelings last longer than that, you should talk to your caregiver.  If desired, talk to your caregiver about methods of family planning or contraception.  Talk to your caregiver about immunizations. Your caregiver may want you to have the following immunizations before leaving the hospital:  Tetanus, diphtheria, and pertussis (Tdap) or tetanus and diphtheria (Td)  immunization. It is very important that you and your family (including grandparents) or others caring for your newborn are up-to-date with the Tdap or Td immunizations. The Tdap or Td immunization can help protect your newborn from getting ill.  Rubella immunization.  Varicella (chickenpox) immunization.  Influenza immunization. You should receive this annual immunization if you did not receive the immunization during your pregnancy. Document Released: 01/01/2007 Document Revised: 11/29/2011 Document Reviewed: 11/01/2011 [facility] Patient Information 2014 [place], [place].   Postpartum Depression and Baby Blues  The postpartum period begins right after the birth of a baby. During this time, there is often a great amount of joy and excitement. It is also a time of considerable changes in the life of the parent(s). Regardless of how  many times a mother gives birth, each child brings new challenges and dynamics to the family. It is not unusual to have feelings of excitement accompanied by confusing shifts in moods, emotions, and thoughts. All mothers are at risk of developing postpartum depression or the "baby blues." These mood changes can occur right after giving birth, or they may occur many months after giving birth. The baby blues or postpartum depression can be mild or severe. Additionally, postpartum depression can resolve rather quickly, or it can be a long-term condition. CAUSES Elevated hormones and their rapid decline are thought to be a main cause of postpartum depression and the baby blues. There are a number of hormones that radically change during and after pregnancy. Estrogen and progesterone usually decrease immediately after delivering your baby. The level of thyroid hormone and various cortisol steroids also rapidly drop. Other factors that play a major role in these changes include major life events and genetics.  RISK FACTORS If you have any of the following risks for the baby blues  or postpartum depression, know what symptoms to watch out for during the postpartum period. Risk factors that may increase the likelihood of getting the baby blues or postpartum depression include:  Havinga personal or family history of depression.  Having depression while being pregnant.  Having premenstrual or oral contraceptive-associated mood issues.  Having exceptional life stress.  Having marital conflict.  Lacking a social support network.  Having a baby with special needs.  Having health problems such as diabetes. SYMPTOMS Baby blues symptoms include:  Brief fluctuations in mood, such as going from extreme happiness to sadness.  Decreased concentration.  Difficulty sleeping.  Crying spells, tearfulness.  Irritability.  Anxiety. Postpartum depression symptoms typically begin within the first month after giving birth. These symptoms include:  Difficulty sleeping or excessive sleepiness.  Marked weight loss.  Agitation.  Feelings of worthlessness.  Lack of interest in activity or food. Postpartum psychosis is a very concerning condition and can be dangerous. Fortunately, it is rare. Displaying any of the following symptoms is cause for immediate medical attention. Postpartum psychosis symptoms include:  Hallucinations and delusions.  Bizarre or disorganized behavior.  Confusion or disorientation. DIAGNOSIS  A diagnosis is made by an evaluation of your symptoms. There are no medical or lab tests that lead to a diagnosis, but there are various questionnaires that a caregiver may use to identify those with the baby blues, postpartum depression, or psychosis. Often times, a screening tool called the [place] Postnatal Depression Scale is used to diagnose depression in the postpartum period.  TREATMENT The baby blues usually goes away on its own in 1 to 2 weeks. Social support is often all that is needed. You should be encouraged to get adequate sleep and rest.  Occasionally, you may be given medicines to help you sleep.  Postpartum depression requires treatment as it can last several months or longer if it is not treated. Treatment may include individual or group therapy, medicine, or both to address any social, physiological, and psychological factors that may play a role in the depression. Regular exercise, a healthy diet, rest, and social support may also be strongly recommended.  Postpartum psychosis is more serious and needs treatment right away. Hospitalization is often needed. HOME CARE INSTRUCTIONS  Get as much rest as you can. Nap when the baby sleeps.  Exercise regularly. Some women find yoga and walking to be beneficial.  Eat a balanced and nourishing diet.  Do little things that you enjoy. Have  a cup of tea, take a bubble bath, read your favorite magazine, or listen to your favorite music.  Avoid alcohol.  Ask for help with household chores, cooking, grocery shopping, or running errands as needed. Do not try to do everything.  Talk to people close to you about how you are feeling. Get support from your partner, family members, friends, or other new moms.  Try to stay positive in how you think. Think about the things you are grateful for.  Do not spend a lot of time alone.  Only take medicine as directed by your caregiver.  Keep all your postpartum appointments.  Let your caregiver know if you have any concerns. SEEK MEDICAL CARE IF: You are having a reaction or problems with your medicine. SEEK IMMEDIATE MEDICAL CARE IF:  You have suicidal feelings.  You feel you may harm the baby or someone else. Document Released: 12/09/2003 Document Revised: 05/29/2011 Document Reviewed: 01/10/2011 [facility] Patient Information 2014 [place], [place].   Discharge to: home  Follow-up Information   Follow up with [facility]. Schedule an appointment as soon as possible for a visit in 6 weeks. (Call with any  questions or concerns.)    Specialty:  Obstetrics and Gynecology   Contact information:   [address] [place] [address] [phone]       Haroldine Laws 02/28/2013

## 2014-01-19 ENCOUNTER — Encounter (HOSPITAL_COMMUNITY): Payer: Self-pay | Admitting: *Deleted

## 2015-09-14 DIAGNOSIS — L299 Pruritus, unspecified: Secondary | ICD-10-CM | POA: Diagnosis not present

## 2015-09-14 DIAGNOSIS — L0390 Cellulitis, unspecified: Secondary | ICD-10-CM | POA: Diagnosis not present

## 2016-02-22 DIAGNOSIS — M722 Plantar fascial fibromatosis: Secondary | ICD-10-CM | POA: Diagnosis not present

## 2016-03-26 ENCOUNTER — Encounter (HOSPITAL_COMMUNITY): Payer: Self-pay | Admitting: Emergency Medicine

## 2016-03-26 ENCOUNTER — Emergency Department (HOSPITAL_COMMUNITY)
Admission: EM | Admit: 2016-03-26 | Discharge: 2016-03-27 | Disposition: A | Discharge status: Home / Self Care | Payer: Medicaid Other | Attending: Emergency Medicine | Admitting: Emergency Medicine

## 2016-03-26 DIAGNOSIS — Y939 Activity, unspecified: Secondary | ICD-10-CM | POA: Insufficient documentation

## 2016-03-26 DIAGNOSIS — Y929 Unspecified place or not applicable: Secondary | ICD-10-CM | POA: Diagnosis not present

## 2016-03-26 DIAGNOSIS — W19XXXA Unspecified fall, initial encounter: Secondary | ICD-10-CM

## 2016-03-26 DIAGNOSIS — S0990XA Unspecified injury of head, initial encounter: Secondary | ICD-10-CM

## 2016-03-26 DIAGNOSIS — Z79899 Other long term (current) drug therapy: Secondary | ICD-10-CM | POA: Diagnosis not present

## 2016-03-26 DIAGNOSIS — S0003XA Contusion of scalp, initial encounter: Secondary | ICD-10-CM | POA: Insufficient documentation

## 2016-03-26 DIAGNOSIS — Y999 Unspecified external cause status: Secondary | ICD-10-CM | POA: Diagnosis not present

## 2016-03-26 DIAGNOSIS — R51 Headache: Secondary | ICD-10-CM | POA: Diagnosis not present

## 2016-03-26 DIAGNOSIS — W0110XA Fall on same level from slipping, tripping and stumbling with subsequent striking against unspecified object, initial encounter: Secondary | ICD-10-CM | POA: Insufficient documentation

## 2016-03-26 NOTE — ED Provider Notes (Signed)
[place] DEPT Provider Note   CSN: [id number] Arrival date & time: 03/26/16  2040 By signing my name below, I, Monica Steele, attest that this documentation has been prepared under the direction and in the presence of non-physician practitioner, Junius Creamer, NP. Electronically Signed: Dyke Steele, Scribe. 03/26/2016. 11:37 PM.   History   Chief Complaint Chief Complaint  Patient presents with  . Fall   HPI Monica Steele is a 31 y.o. female who presents to the Emergency Department complaining of gradual onset, constant, moderate dizziness tonight at 5 pm s/p fall at 10 this AM. Pt states she slipped and fell on to her right side, hitting her head. She notes associated right sided headache, blurred vision to bilateral eyes, nausea, and right lateral eye pain. Pt took 2 Advil at 7 pm tonight with no relief of symptoms. No alleviating or modifying factors noted. Pt wears glasses and states she  has some blurred vision while wearing glasses as well. Pt denies any neck pain, LOC or vomiting. LNMP yesterday.   The history is provided by the patient. A language interpreter was used.    Past Medical History:  Diagnosis Date  . H/O seasonal allergies     Patient Active Problem List   Diagnosis Date Noted  . NSVD (normal spontaneous vaginal delivery) 02/28/2013  . Second-degree perineal laceration, with delivery 02/28/2013    Past Surgical History:  Procedure Laterality Date  . NO PAST SURGERIES      OB History    Gravida Para Term Preterm AB Living   2 1 1  0 1 1   SAB TAB Ectopic Multiple Live Births   1 0 0 0 1      Home Medications    Prior to Admission medications   Medication Sig Start Date End Date Taking? Authorizing Provider  ibuprofen (ADVIL,MOTRIN) 600 MG tablet Take 1 tablet (600 mg total) by mouth every 6 (six) hours. 03/27/16   Junius Creamer, NP    Family History No family history on file.  Social History Social History  Substance Use Topics  . Smoking  status: Never Smoker  . Smokeless tobacco: Never Used  . Alcohol use Yes     Comment: occas    Allergies   Patient has no known allergies.   Review of Systems Review of Systems  Constitutional: Negative for activity change, appetite change and fever.  HENT: Negative for congestion and rhinorrhea.   Eyes: Positive for pain and visual disturbance.  Gastrointestinal: Negative for nausea and vomiting.  Musculoskeletal: Positive for arthralgias. Negative for neck pain.  Neurological: Positive for dizziness and headaches. Negative for syncope.  Hematological: Does not bruise/bleed easily.  All other systems reviewed and are negative.  Physical Exam Updated Vital Signs BP 107/72 (BP Location: Left Arm)   Pulse 93   Temp 98.9 F (37.2 C) (Oral)   Resp 18   LMP 03/25/2016   SpO2 98%   Physical Exam  Constitutional: She is oriented to person, place, and time. She appears well-developed and well-nourished.  HENT:  Head: Normocephalic.    Right Ear: External ear normal.  Left Ear: External ear normal.  Mouth/Throat: Oropharynx is clear and moist.  Small hematoma on the occipital area about 3 cm around.   Eyes: Pupils are equal, round, and reactive to light. Right eye exhibits normal extraocular motion and no nystagmus. Left eye exhibits normal extraocular motion and no nystagmus.  Neck: Normal range of motion.  Cardiovascular: Normal rate and regular rhythm.  Pulmonary/Chest: Effort normal and breath sounds normal.  Musculoskeletal: Normal range of motion.  Neurological: She is alert and oriented to person, place, and time.  Skin: Skin is warm and dry.  Nursing note and vitals reviewed.   ED Treatments / Results  DIAGNOSTIC STUDIES:  Oxygen Saturation is 99% on RA, normal by my interpretation.    COORDINATION OF CARE:  11:34 PM Discussed treatment plan with pt at bedside and pt agreed to plan.   Labs (all labs ordered are listed, but only abnormal results are  displayed) Labs Reviewed - No data to display  EKG  EKG Interpretation None       Radiology Ct Head Wo Contrast  Result Date: 03/27/2016 CLINICAL DATA:  Patient fell, striking right side of head on 03/26/2016. Subsequent dizziness with right eye pain and blurry vision. Right-sided headache. No loss of consciousness. EXAM: CT HEAD WITHOUT CONTRAST TECHNIQUE: Contiguous axial images were obtained from the base of the skull through the vertex without intravenous contrast. COMPARISON:  None. FINDINGS: Brain: No evidence of acute infarction, hemorrhage, hydrocephalus, extra-axial collection or mass lesion/mass effect. Vascular: No hyperdense vessel or unexpected calcification. Skull: Normal. Negative for fracture or focal lesion. Sinuses/Orbits: No acute finding. Other: None. IMPRESSION: No acute intracranial abnormalities. Electronically Signed   By: Lucienne Capers M.D.   On: 03/27/2016 01:06    Procedures Procedures (including critical care time)  Medications Ordered in ED Medications - No data to display   Initial Impression / Assessment and Plan / ED Course  I have reviewed the triage vital signs and the nursing notes.  Pertinent labs & imaging results that were available during my care of the patient were reviewed by me and considered in my medical decision making (see chart for details).  Clinical Course     Will obtain visual acuity and head CT Scan   Final Clinical Impressions(s) / ED Diagnoses   Final diagnoses:  Fall, initial encounter  Minor head injury, initial encounter    New Prescriptions Current Discharge Medication List    I personally performed the services described in this documentation, which was scribed in my presence. The recorded information has been reviewed and is accurate.   Junius Creamer, NP 03/27/16 0010    Junius Creamer, NP 03/27/16 0134    Orpah Greek, MD 03/28/16 [phone]

## 2016-03-26 NOTE — ED Triage Notes (Signed)
Pt reports having a fall at 10 am today due to a slippery fall and fell onto right side and hit her head.  No blood thinners and no LOC. Around 5 today had dizziness. At 7 tonight took 2 advils.

## 2016-03-27 ENCOUNTER — Emergency Department (HOSPITAL_COMMUNITY): Payer: Medicaid Other

## 2016-03-27 DIAGNOSIS — R51 Headache: Secondary | ICD-10-CM | POA: Diagnosis not present

## 2016-03-27 MED ORDER — IBUPROFEN 600 MG PO TABS: 600.0000 mg | ORAL_TABLET | Freq: Four times a day (QID) | ORAL | 0 refills | Status: DC

## 2016-04-10 DIAGNOSIS — N898 Other specified noninflammatory disorders of vagina: Secondary | ICD-10-CM | POA: Diagnosis not present

## 2016-04-10 DIAGNOSIS — Z124 Encounter for screening for malignant neoplasm of cervix: Secondary | ICD-10-CM | POA: Diagnosis not present

## 2016-04-10 DIAGNOSIS — Z01419 Encounter for gynecological examination (general) (routine) without abnormal findings: Secondary | ICD-10-CM | POA: Diagnosis not present

## 2016-05-14 DIAGNOSIS — S0921XA Traumatic rupture of right ear drum, initial encounter: Secondary | ICD-10-CM | POA: Diagnosis not present

## 2016-05-14 DIAGNOSIS — H9311 Tinnitus, right ear: Secondary | ICD-10-CM | POA: Diagnosis not present

## 2016-06-05 DIAGNOSIS — H903 Sensorineural hearing loss, bilateral: Secondary | ICD-10-CM | POA: Diagnosis not present

## 2016-06-05 DIAGNOSIS — H9311 Tinnitus, right ear: Secondary | ICD-10-CM | POA: Diagnosis not present

## 2016-12-18 ENCOUNTER — Ambulatory Visit (INDEPENDENT_AMBULATORY_CARE_PROVIDER_SITE_OTHER): Payer: BLUE CROSS/BLUE SHIELD | Admitting: Family Medicine

## 2016-12-18 ENCOUNTER — Encounter: Payer: Self-pay | Admitting: Family Medicine

## 2016-12-18 VITALS — BP 100/64 | HR 82 | Temp 98.4°F | Resp 16 | Ht 61.0 in | Wt 158.8 lb

## 2016-12-18 DIAGNOSIS — G518 Other disorders of facial nerve: Secondary | ICD-10-CM | POA: Diagnosis not present

## 2016-12-18 DIAGNOSIS — W19XXXA Unspecified fall, initial encounter: Secondary | ICD-10-CM

## 2016-12-18 DIAGNOSIS — Y92009 Unspecified place in unspecified non-institutional (private) residence as the place of occurrence of the external cause: Secondary | ICD-10-CM | POA: Diagnosis not present

## 2016-12-18 MED ORDER — GABAPENTIN 100 MG PO CAPS: 100.0000 mg | ORAL_CAPSULE | Freq: Every day | ORAL | 0 refills | Status: DC

## 2016-12-18 NOTE — Progress Notes (Signed)
Chief Complaint  Patient presents with  . numbness on right side of head     x 1 week, pt fell and hit right side of head in January.  Pain level 7-8/10, tylenol for pain with no relief    HPI  Translator present: Kelly   Pt fell in January 2018 and hit the right side of her head  She reports that she fell and hit her head on the floor No LOC No vision changes No nausea or vomiting She describes that numbness as pins and needles  It does not radiate to her neck, shoulder or hands Her pain there is about 7/10 She states that the numbness causes throbbing at times She takes tylenol for the pain    Past Medical History:  Diagnosis Date  . H/O seasonal allergies     Current Outpatient Prescriptions  Medication Sig Dispense Refill  . ibuprofen (ADVIL,MOTRIN) 600 MG tablet Take 1 tablet (600 mg total) by mouth every 6 (six) hours. 30 tablet 0  . gabapentin (NEURONTIN) 100 MG capsule Take 1 capsule (100 mg total) by mouth at bedtime. Increase to 2 capsule at bedtime in one week. 60 capsule 0   No current facility-administered medications for this visit.     Allergies: No Known Allergies  Past Surgical History:  Procedure Laterality Date  . NO PAST SURGERIES      Social History   Social History  . Marital status: Single    Spouse name: N/A  . Number of children: N/A  . Years of education: N/A   Social History Main Topics  . Smoking status: Never Smoker  . Smokeless tobacco: Never Used  . Alcohol use Yes     Comment: occas  . Drug use: No  . Sexual activity: Not Currently   Other Topics Concern  . None   Social History Narrative  . None    Review of Systems  Constitutional: Negative for chills, diaphoresis, fever, malaise/fatigue and weight loss.  HENT: Negative for ear discharge, ear pain, hearing loss and tinnitus.   Eyes: Negative for blurred vision, double vision, photophobia, pain, discharge and redness.  Respiratory: Negative for cough and shortness  of breath.   Cardiovascular: Negative for chest pain and palpitations.  Gastrointestinal: Negative for nausea and vomiting.  Genitourinary: Negative for dysuria and frequency.  Musculoskeletal: Negative for back pain, falls, myalgias and neck pain.       No recent falls  Skin: Negative for itching and rash.  Neurological: Positive for tingling. Negative for dizziness, tremors, sensory change, speech change, focal weakness, seizures, loss of consciousness, weakness and headaches.  Psychiatric/Behavioral: Negative for depression. The patient is not nervous/anxious and does not have insomnia.     Objective: Vitals:   12/18/16 0848  BP: 100/64  Pulse: 82  Resp: 16  Temp: 98.4 F (36.9 C)  TempSrc: Oral  SpO2: 98%  Weight: 158 lb 12.8 oz (72 kg)  Height: 5\' 1"  (1.549 m)    Physical Exam  Constitutional: She is oriented to person, place, and time. She appears well-developed and well-nourished.  HENT:  Head: Normocephalic and atraumatic.  Right Ear: External ear normal.  Left Ear: External ear normal.  Mouth/Throat: Oropharynx is clear and moist.  Eyes: Pupils are equal, round, and reactive to light. Conjunctivae and EOM are normal. Right eye exhibits no discharge. Left eye exhibits no discharge. No scleral icterus.  Normal fundoscopic exam  Neck: Normal range of motion. Neck supple.  Cardiovascular: Normal rate, regular rhythm  and normal heart sounds.   No murmur heard. Pulmonary/Chest: Effort normal and breath sounds normal. No respiratory distress. She has no wheezes. She has no rales.  Musculoskeletal: Normal range of motion. She exhibits no edema or deformity.  Neurological: She is alert and oriented to person, place, and time.  Skin: Skin is warm. Capillary refill takes less than 2 seconds. No erythema.  Psychiatric: She has a normal mood and affect. Her behavior is normal. Judgment and thought content normal.   CRANIAL NERVES: CN II: Visual fields are full to  confrontation. Fundoscopic exam is normal with sharp discs and no vascular changes. Pupils are round equal and briskly reactive to light. CN III, IV, VI: extraocular movement are normal. No ptosis. CN V: Facial sensation is intact to pinprick in all 3 divisions bilaterally. Corneal responses are intact.  CN VII: Face is symmetric with normal eye closure and smile. CN VIII: Hearing is normal to rubbing fingers CN IX, X: Palate elevates symmetrically. Phonation is normal. CN XI: Head turning and shoulder shrug are intact CN XII: Tongue is midline with normal movements and no atrophy.  Assessment and Plan Monica Steele was seen today for numbness on right side of head.  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 Increase to 2 capsule at bedtime in one week.  Fall in home, initial encounter -  No neuro deficits -   This is not a recurrent issue  A total of 25 minutes were spent face-to-face with the patient during this encounter and over half of that time was spent on counseling and coordination of care.   [place]

## 2016-12-18 NOTE — Patient Instructions (Addendum)
1. Take Gabapentin at bedtime one capsule then increase to 2 capsules at bedtime after one week 2. Call for more refills if this helps 3. If symptoms resolve can take gabapentin as needed 4. If there is no improvement in 2 months return to clinic     IF you received an x-ray today, you will receive an invoice from [facility]. Please contact [facility] at [phone] with questions or concerns regarding your invoice.   IF you received labwork today, you will receive an invoice from [place]. Please contact [facility] at [phone] with questions or concerns regarding your invoice.   Our billing staff will not be able to assist you with questions regarding bills from these companies.  You will be contacted with the lab results as soon as they are available. The fastest way to get your results is to activate your My Chart account. Instructions are located on the last page of this paperwork. If you have not heard from [place] regarding the results in 2 weeks, please contact this office.     Neuropathic Pain Neuropathic pain is pain caused by damage to the nerves that are responsible for certain sensations in your body (sensory nerves). The pain can be caused by damage to:  The sensory nerves that send signals to your spinal cord and brain (peripheral nervous system).  The sensory nerves in your brain or spinal cord (central nervous system).  Neuropathic pain can make you more sensitive to pain. What would be a minor sensation for most people may feel very painful if you have neuropathic pain. This is usually a long-term condition that can be difficult to treat. The type of pain can differ from person to person. It may start suddenly (acute), or it may develop slowly and last for a long time (chronic). Neuropathic pain may come and go as damaged nerves heal or may stay at the same level for years. It often causes emotional distress, loss of sleep, and a lower quality of life. What  are the causes? The most common cause of damage to a sensory nerve is diabetes. Many other diseases and conditions can also cause neuropathic pain. Causes of neuropathic pain can be classified as:  Toxic. Many drugs and chemicals can cause toxic damage. The most common cause of toxic neuropathic pain is damage from drug treatment for cancer (chemotherapy).  Metabolic. This type of pain can happen when a disease causes imbalances that damage nerves. Diabetes is the most common of these diseases. Vitamin B deficiency caused by long-term alcohol abuse is another common cause.  Traumatic. Any injury that cuts, crushes, or stretches a nerve can cause damage and pain. A common example is feeling pain after losing an arm or leg (phantom limb pain).  Compression-related. If a sensory nerve gets trapped or compressed for a long period of time, the blood supply to the nerve can be cut off.  Vascular. Many blood vessel diseases can cause neuropathic pain by decreasing blood supply and oxygen to nerves.  Autoimmune. This type of pain results from diseases in which the body's defense system mistakenly attacks sensory nerves. Examples of autoimmune diseases that can cause neuropathic pain include lupus and multiple sclerosis.  Infectious. Many types of viral infections can damage sensory nerves and cause pain. Shingles infection is a common cause of this type of pain.  Inherited. Neuropathic pain can be a symptom of many diseases that are passed down through families (genetic).  What are the signs or symptoms? The main symptom  is pain. Neuropathic pain is often described as:  Burning.  Shock-like.  Stinging.  Hot or cold.  Itching.  How is this diagnosed? No single test can diagnose neuropathic pain. Your health care provider will do a physical exam and ask you about your pain. You may use a pain scale to describe how bad your pain is. You may also have tests to see if you have a high sensitivity  to pain and to help find the cause and location of any sensory nerve damage. These tests may include:  Imaging studies, such as: ? X-rays. ? CT scan. ? MRI.  Nerve conduction studies to test how well nerve signals travel through your sensory nerves (electrodiagnostic testing).  Stimulating your sensory nerves through electrodes on your skin and measuring the response in your spinal cord and brain (somatosensory evoked potentials).  How is this treated? Treatment for neuropathic pain may change over time. You may need to try different treatment options or a combination of treatments. Some options include:  Over-the-counter pain relievers.  Prescription medicines. Some medicines used to treat other conditions may also help neuropathic pain. These include medicines to: ? Control seizures (anticonvulsants). ? Relieve depression (antidepressants).  Prescription-strength pain relievers (narcotics). These are usually used when other pain relievers do not help.  Transcutaneous nerve stimulation (TENS). This uses electrical currents to block painful nerve signals. The treatment is painless.  Topical and local anesthetics. These are medicines that numb the nerves. They can be injected as a nerve block or applied to the skin.  Alternative treatments, such as: ? Acupuncture. ? Meditation. ? Massage. ? Physical therapy. ? Pain management programs. ? Counseling.  Follow these instructions at home:  Learn as much as you can about your condition.  Take medicines only as directed by your health care provider.  Work closely with all your health care providers to find what works best for you.  Have a good support system at home.  Consider joining a chronic pain support group. Contact a health care provider if:  Your pain treatments are not helping.  You are having side effects from your medicines.  You are struggling with fatigue, mood changes, depression, or anxiety. This  information is not intended to replace advice given to you by your health care provider. Make sure you discuss any questions you have with your health care provider. Document Released: 12/02/2003 Document Revised: 09/24/2015 Document Reviewed: 08/14/2013 Elsevier Interactive Patient Education  [employer].

## 2016-12-19 LAB — CBC
HEMATOCRIT: 41.5 % (ref 34.0–46.6)
Hemoglobin: 14.2 g/dL (ref 11.1–15.9)
MCH: 28.9 pg (ref 26.6–33.0)
MCHC: 34.2 g/dL (ref 31.5–35.7)
MCV: 84 fL (ref 79–97)
Platelets: 360 10*3/uL (ref 150–379)
RBC: 4.92 x10E6/uL (ref 3.77–5.28)
RDW: 12.5 % (ref 12.3–15.4)
WBC: 8.9 10*3/uL (ref 3.4–10.8)

## 2016-12-19 LAB — VARICELLA ZOSTER ABS, IGG/IGM
Varicella IgM: <0.91 index (ref 0.00–0.90)
Varicella zoster IgG: <135 index — ABNORMAL LOW (ref >165)

## 2016-12-19 LAB — SEDIMENTATION RATE: SED RATE: 33 mm/h — AB (ref 0–32)

## 2016-12-25 ENCOUNTER — Encounter: Payer: Self-pay | Admitting: Family Medicine

## 2016-12-25 ENCOUNTER — Ambulatory Visit (INDEPENDENT_AMBULATORY_CARE_PROVIDER_SITE_OTHER): Payer: BLUE CROSS/BLUE SHIELD | Admitting: Family Medicine

## 2016-12-25 VITALS — BP 104/64 | HR 87 | Temp 99.0°F | Resp 18 | Ht 61.1 in | Wt 162.4 lb

## 2016-12-25 DIAGNOSIS — Z23 Encounter for immunization: Secondary | ICD-10-CM

## 2016-12-25 DIAGNOSIS — Z0000 Encounter for general adult medical examination without abnormal findings: Secondary | ICD-10-CM

## 2016-12-25 NOTE — Patient Instructions (Signed)
     IF you received an x-ray today, you will receive an invoice from [facility]. Please contact [facility] at [phone] with questions or concerns regarding your invoice.   IF you received labwork today, you will receive an invoice from [facility]. Please contact [facility] at [phone] with questions or concerns regarding your invoice.   Our billing staff will not be able to assist you with questions regarding bills from these companies.  You will be contacted with the lab results as soon as they are available. The fastest way to get your results is to activate your My Chart account. Instructions are located on the last page of this paperwork. If you have not heard from us regarding the results in 2 weeks, please contact this office.     

## 2016-12-25 NOTE — Progress Notes (Signed)
10/8/20182:09 PM  Ngoc Gronau 22-Jan-1986, 31 y.o. female [id number]  Chief Complaint  Patient presents with  . Annual Exam    HPI:   Patient is a 31 y.o. female who presents today for her annual exam  G1P1 Menses are regular Not on [facility] Last pap 2017  H/o headaches, takes ibuprofen, wears eyeglasses only for driving, last eye exam 2017  Works as Scientist, forensic Denies t/e/d Does not exercise  Depression screen [facility] 2/9 12/25/2016 12/18/2016  Decreased Interest 0 0  Down, Depressed, Hopeless 0 0  PHQ - 2 Score 0 0    No Known Allergies  Prior to Admission medications   Medication Sig Start Date End Date Taking? Authorizing Provider  gabapentin (NEURONTIN) 100 MG capsule Take 1 capsule (100 mg total) by mouth at bedtime. Increase to 2 capsule at bedtime in one week. 12/18/16  Yes Delia Chimes A, MD  ibuprofen (ADVIL,MOTRIN) 600 MG tablet Take 1 tablet (600 mg total) by mouth every 6 (six) hours. 03/27/16  Yes Junius Creamer, NP    Past Medical History:  Diagnosis Date  . H/O seasonal allergies     Past Surgical History:  Procedure Laterality Date  . NO PAST SURGERIES      Social History  Substance Use Topics  . Smoking status: Never Smoker  . Smokeless tobacco: Never Used  . Alcohol use No     Comment: occas    History reviewed. No pertinent family history.  Review of Systems  Constitutional: Negative for chills and fever.  HENT: Negative for congestion, ear pain and sore throat.   Eyes: Positive for blurred vision. Negative for pain, discharge and redness.  Respiratory: Negative for cough and shortness of breath.   Cardiovascular: Positive for chest pain (occ, when she wakes up, with breathing, lasts 15-20 mins, self-resolves, none with activity). Negative for palpitations and leg swelling.  Gastrointestinal: Positive for constipation. Negative for abdominal pain, blood in stool, diarrhea, nausea and vomiting.  Genitourinary: Negative for dysuria and  hematuria.  Neurological: Positive for tingling and headaches.  Psychiatric/Behavioral: Negative for depression and substance abuse. The patient is not nervous/anxious.      OBJECTIVE:  Blood pressure 104/64, pulse 87, temperature 99 F (37.2 C), temperature source Oral, resp. rate 18, height 5' 1.1" (1.552 m), weight 162 lb 6.4 oz (73.7 kg), last menstrual period 12/08/2016, SpO2 99 %, currently breastfeeding.   Visual Acuity Screening   Right eye Left eye Both eyes  Without correction: 20/100 20/100 20/50  With correction:       Physical Exam  Constitutional: She is oriented to person, place, and time and well-developed, well-nourished, and in no distress.  HENT:  Head: Normocephalic and atraumatic.  Right Ear: Hearing, tympanic membrane, external ear and ear canal normal.  Left Ear: Hearing, tympanic membrane, external ear and ear canal normal.  Mouth/Throat: Oropharynx is clear and moist.  Eyes: Pupils are equal, round, and reactive to light. EOM are normal.  Neck: Neck supple. No thyromegaly present.  Cardiovascular: Normal rate, regular rhythm, normal heart sounds and intact distal pulses.  Exam reveals no gallop and no friction rub.   No murmur heard. Pulmonary/Chest: Effort normal and breath sounds normal. She has no wheezes. She has no rales.  Abdominal: Soft. Bowel sounds are normal. She exhibits no distension and no mass. There is no tenderness.  Musculoskeletal: Normal range of motion. She exhibits no edema.  Lymphadenopathy:    She has no cervical adenopathy.  Neurological: She is alert  and oriented to person, place, and time. She has normal reflexes. Gait normal.  Skin: Skin is warm and dry.  Psychiatric: Mood and affect normal.    ASSESSMENT and PLAN 1. Annual physical exam No concerns per history or exam. Routine HCM labs ordered. HCM reviewed/discussed. Anticipatory guidance regarding healthy weight, lifestyle and choices given. Strongly encouraged to be seen  by her eye doctor.  - TSH - Lipid panel  2. Need for influenza vaccination - Flu Vaccine QUAD 36+ mos IM  Return in about 1 year (around 12/25/2017).    Rutherford Guys, MD [facility] [place], [place] [address] Ph.  [phone] Fax [phone]

## 2016-12-26 ENCOUNTER — Encounter: Payer: Self-pay | Admitting: Family Medicine

## 2016-12-26 LAB — LIPID PANEL
Chol/HDL Ratio: 3.9 ratio (ref 0.0–4.4)
Cholesterol, Total: 139 mg/dL (ref 100–199)
HDL: 36 mg/dL — ABNORMAL LOW (ref >39)
LDL Calculated: 73 mg/dL (ref 0–99)
Triglycerides: 151 mg/dL — ABNORMAL HIGH (ref 0–149)
VLDL Cholesterol Cal: 30 mg/dL (ref 5–40)

## 2016-12-26 LAB — TSH: TSH: 1.8 u[IU]/mL (ref 0.450–4.500)

## 2017-01-22 ENCOUNTER — Ambulatory Visit: Payer: BLUE CROSS/BLUE SHIELD | Admitting: Family Medicine

## 2017-01-22 ENCOUNTER — Encounter: Payer: Self-pay | Admitting: Family Medicine

## 2017-01-22 VITALS — BP 102/60 | HR 97 | Temp 98.8°F | Resp 17 | Ht 61.5 in | Wt 161.4 lb

## 2017-01-22 DIAGNOSIS — Z23 Encounter for immunization: Secondary | ICD-10-CM

## 2017-01-22 DIAGNOSIS — G518 Other disorders of facial nerve: Secondary | ICD-10-CM

## 2017-01-22 MED ORDER — GABAPENTIN 300 MG PO CAPS: 300.0000 mg | ORAL_CAPSULE | Freq: Every day | ORAL | 3 refills | Status: DC

## 2017-01-22 NOTE — Progress Notes (Signed)
Chief Complaint  Patient presents with  . Follow-up    recheck headaches, per pt headaches better with medication    HPI  [employer] interpreter ID [id number]  Pt reports that her symptoms are still present with numbness of  She reports that the headaches have lessened She gets the headaches comes back once in a while however if she takes her hand rubs her hand on both sides of her hand the right side feels numb When she rubs her head there is no pain but numbness only She is taking 2 capsules of gabapentin at bedtime She denies any side effects    Past Medical History:  Diagnosis Date  . H/O seasonal allergies     Current Outpatient Medications  Medication Sig Dispense Refill  . gabapentin (NEURONTIN) 300 MG capsule Take 1 capsule (300 mg total) at bedtime by mouth. Increase to 2 capsule at bedtime in one week. 90 capsule 3  . ibuprofen (ADVIL,MOTRIN) 600 MG tablet Take 1 tablet (600 mg total) by mouth every 6 (six) hours. 30 tablet 0   No current facility-administered medications for this visit.     Allergies: No Known Allergies  Past Surgical History:  Procedure Laterality Date  . NO PAST SURGERIES      Social History   Socioeconomic History  . Marital status: Single    Spouse name: None  . Number of children: None  . Years of education: None  . Highest education level: None  Social Needs  . Financial resource strain: None  . Food insecurity - worry: None  . Food insecurity - inability: None  . Transportation needs - medical: None  . Transportation needs - non-medical: None  Occupational History  . None  Tobacco Use  . Smoking status: Never Smoker  . Smokeless tobacco: Never Used  Substance and Sexual Activity  . Alcohol use: No    Comment: occas  . Drug use: No  . Sexual activity: Not Currently  Other Topics Concern  . None  Social History Narrative  . None    History reviewed. No pertinent family history.   Review of Systems  Constitutional:  Negative for chills, fever and weight loss.  HENT: Positive for hearing loss and tinnitus. Negative for congestion, ear discharge, ear pain and nosebleeds.   Respiratory: Negative for cough and hemoptysis.   Cardiovascular: Negative for chest pain, palpitations and orthopnea.  Genitourinary: Negative for dysuria and urgency.  Musculoskeletal: Negative for myalgias and neck pain.  Skin: Negative for itching and rash.  Neurological: Positive for tingling. Negative for tremors, sensory change and speech change.      Objective: Vitals:   01/22/17 0922  BP: 102/60  Pulse: 97  Resp: 17  Temp: 98.8 F (37.1 C)  TempSrc: Oral  SpO2: 97%  Weight: 161 lb 6.4 oz (73.2 kg)  Height: 5' 1.5" (1.562 m)    Physical Exam  Constitutional: She is oriented to person, place, and time. She appears well-developed and well-nourished.  HENT:  Head: Normocephalic and atraumatic.  Eyes: Conjunctivae and EOM are normal.  Cardiovascular: Normal rate, regular rhythm and normal heart sounds.  Pulmonary/Chest: Effort normal and breath sounds normal. No stridor. No respiratory distress.  Neurological: She is alert and oriented to person, place, and time.  Psychiatric: She has a normal mood and affect. Her behavior is normal. Judgment and thought content normal.    CRANIAL NERVES: CN II: Visual fields are full to confrontation. Fundoscopic exam is normal with sharp discs and no vascular  changes. Pupils are round equal and briskly reactive to light. CN III, IV, VI: extraocular movement are normal. No ptosis. CN V: Facial sensation is intact to pinprick in all 3 divisions but increased on right on the cheek and forehead. Corneal responses are intact.  CN VII: Face is symmetric with normal eye closure and smile. CN VIII: Hearing is increased to rubbing fingers  CN IX, X: Palate elevates symmetrically. Phonation is normal. CN XI: Head turning and shoulder shrug are intact CN XII: Tongue is midline with normal  movements and no atrophy.  Assessment and Plan Ngoc was seen today for follow-up.  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 Increase to 2 capsule at bedtime in one week. 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 Pain improved with gabapentin but numbness remains.  She was advised to continue Gabapentin but is worried about long term effects. Referred to Neurology. Advised about pregnancy risks. Pt aware and will stop taking if she plans pregnancy.       Relevant Medications   gabapentin (NEURONTIN) 300 MG capsule   Other Relevant Orders   Ambulatory referral to Neurology    Other Visit Diagnoses    Need for Tdap vaccination    -  Primary       Maki Hege A Nolon Rod

## 2017-01-22 NOTE — Patient Instructions (Addendum)
IF you received an x-ray today, you will receive an invoice from [facility]. Please contact [facility] at [phone] with questions or concerns regarding your invoice.   IF you received labwork today, you will receive an invoice from [place]. Please contact [facility] at [phone] with questions or concerns regarding your invoice.   Our billing staff will not be able to assist you with questions regarding bills from these companies.  You will be contacted with the lab results as soon as they are available. The fastest way to get your results is to activate your My Chart account. Instructions are located on the last page of this paperwork. If you have not heard from [place] regarding the results in 2 weeks, please contact this office.    Electromyoneurogram Electromyoneurogram is a test to check how well your muscles and nerves are working. This procedure includes the combined use of electromyogram (EMG) and nerve conduction study (NCS). EMG is used to look for muscular disorders. NCS, which is also called electroneurogram, measures how well your nerves are controlling your muscles. The procedures are usually performed together to check if your muscles and nerves are healthy. If the reaction to testing is abnormal, this can indicate disease or injury, such as peripheral nerve damage. Tell a health care provider about:  Any allergies you have.  All medicines you are taking, including vitamins, herbs, eye drops, creams, and over-the-counter medicines.  Any problems you or family members have had with anesthetic medicines.  Any blood disorders you have.  Any surgeries you have had.  Any medical conditions you have.  Any pacemaker you have. What are the risks? Generally, this is a safe procedure. However, problems may occur, including:  Infection where the electrodes were inserted.  Bleeding.  What happens before the procedure?  Ask your health care provider  about: ? Changing or stopping your regular medicines. This is especially important if you are taking diabetes medicines or blood thinners. ? Taking medicines such as aspirin and ibuprofen. These medicines can thin your blood. Do not take these medicines before your procedure if your health care provider instructs you not to.  Your health care provider may ask you to avoid: ? Caffeine, such as coffee and tea. ? Nicotine. This includes cigarettes and anything with tobacco.  Do not use lotions or creams on the same day that you will be having the procedure. What happens during the procedure? For EMG:  Your health care provider will ask you to stay in a position so that he or she can access the muscle that will be studied. You may be standing, sitting down, or lying down.  You may be given a medicine that numbs the area (local anesthetic).  A very thin needle that has an electrode on it will be inserted into your muscle.  Another small electrode will be placed on your skin near the muscle.  Your health care provider will ask you to continue to remain still.  The electrodes will send a signal that tells about the electrical activity of your muscles. You may see this on a monitor or hear it in the room.  After your muscles have been studied at rest, your health care provider will ask you to contract or flex your muscles. The electrodes will send a signal that tells about the electrical activity of your muscles.  Your health care provider will remove the electrodes and the electrode needles when the procedure is finished. The procedure may vary among  health care providers and hospitals. For NCS:  An electrode that records your nerve activity (recording electrode) will be placed on your skin by the muscle that is being studied.  An electrode that is used as a reference (reference electrode) will be placed near the recording electrode.  A paste or gel will be applied to your skin between the  recording electrode and the reference electrode.  Your nerve will be stimulated with a mild shock. Your health care provider will measure how much time it takes for your muscle to react.  Your health care provider will remove the electrodes and the gel when the procedure is finished. The procedure may vary among health care providers and hospitals. What happens after the procedure?  It is your responsibility to obtain your test results. Ask your health care provider or the department performing the test when and how you will get your results.  Your health care provider may: ? Give you medicines for any pain. ? Monitor the insertion sites to make sure that they stop bleeding. This information is not intended to replace advice given to you by your health care provider. Make sure you discuss any questions you have with your health care provider. Document Released: 07/07/2004 Document Revised: 08/12/2015 Document Reviewed: 04/27/2014 Elsevier Interactive Patient Education  2018 [employer].

## 2017-01-22 NOTE — Assessment & Plan Note (Addendum)
Persistent paresthesia in the facial nerve distribution on right s/p Fall January 2018. Pain improved with gabapentin but numbness remains.  She was advised to continue Gabapentin but is worried about long term effects. Referred to Neurology. Advised about pregnancy risks. Pt aware and will stop taking if she plans pregnancy.

## 2017-04-17 DIAGNOSIS — Z01419 Encounter for gynecological examination (general) (routine) without abnormal findings: Secondary | ICD-10-CM | POA: Diagnosis not present

## 2017-04-17 DIAGNOSIS — Z3040 Encounter for surveillance of contraceptives, unspecified: Secondary | ICD-10-CM | POA: Diagnosis not present

## 2017-05-21 ENCOUNTER — Ambulatory Visit: Payer: BLUE CROSS/BLUE SHIELD | Admitting: Family Medicine

## 2017-05-21 ENCOUNTER — Other Ambulatory Visit: Payer: Self-pay

## 2017-05-21 ENCOUNTER — Encounter: Payer: Self-pay | Admitting: Family Medicine

## 2017-05-21 VITALS — BP 90/60 | HR 91 | Temp 98.4°F | Resp 16 | Ht 61.0 in | Wt 159.2 lb

## 2017-05-21 DIAGNOSIS — Z23 Encounter for immunization: Secondary | ICD-10-CM

## 2017-05-21 DIAGNOSIS — G518 Other disorders of facial nerve: Secondary | ICD-10-CM

## 2017-05-21 NOTE — Progress Notes (Signed)
Chief Complaint  Patient presents with  . Numbness    follow up 4 months    HPI    She reports that she continues to have numbness of the face She is still taking gabapentin and is not seeing any changes No pain  Just numbness and headaches  The headache is on the same side as the numbness  She states that she has been having fatigue around the eyes but that her vision is normal.  No numbness in her mouth or teeth No perioral tingling No slurred speech No throbbing pain at the temple No drooping of face or eyelid A referral was placed for Neurology but there was no voicemail set up     Past Medical History:  Diagnosis Date  . H/O seasonal allergies     Current Outpatient Medications  Medication Sig Dispense Refill  . gabapentin (NEURONTIN) 300 MG capsule Take 1 capsule (300 mg total) at bedtime by mouth. Increase to 2 capsule at bedtime in one week. 90 capsule 3  . ibuprofen (ADVIL,MOTRIN) 600 MG tablet Take 1 tablet (600 mg total) by mouth every 6 (six) hours. 30 tablet 0   No current facility-administered medications for this visit.     Allergies: No Known Allergies  Past Surgical History:  Procedure Laterality Date  . NO PAST SURGERIES      Social History   Socioeconomic History  . Marital status: Single    Spouse name: None  . Number of children: None  . Years of education: None  . Highest education level: None  Social Needs  . Financial resource strain: None  . Food insecurity - worry: None  . Food insecurity - inability: None  . Transportation needs - medical: None  . Transportation needs - non-medical: None  Occupational History  . None  Tobacco Use  . Smoking status: Never Smoker  . Smokeless tobacco: Never Used  Substance and Sexual Activity  . Alcohol use: No    Comment: occas  . Drug use: No  . Sexual activity: Not Currently  Other Topics Concern  . None  Social History Narrative  . None    No family history on  file.   ROS Review of Systems See HPI Constitution: No fevers or chills No malaise No diaphoresis Skin: No rash or itching Eyes: no blurry vision, no double vision GU: no dysuria or hematuria Neuro: no dizziness or headaches all others reviewed and negative   Objective: Vitals:   05/21/17 0855  BP: 90/60  Pulse: 91  Resp: 16  Temp: 98.4 F (36.9 C)  TempSrc: Oral  SpO2: 96%  Weight: 159 lb 3.2 oz (72.2 kg)  Height: 5\' 1"  (1.549 m)    Physical Exam  Constitutional: She is oriented to person, place, and time. She appears well-developed and well-nourished.  HENT:  Head: Normocephalic and atraumatic.  Right Ear: External ear normal.  Left Ear: External ear normal.  Mouth/Throat: Oropharynx is clear and moist.  Eyes: Conjunctivae and EOM are normal. Pupils are equal, round, and reactive to light.  Cardiovascular: Normal rate, regular rhythm and normal heart sounds.  No murmur heard. Pulmonary/Chest: Effort normal and breath sounds normal. No stridor. No respiratory distress.  Musculoskeletal: Normal range of motion. She exhibits no edema.  Neurological: She is alert and oriented to person, place, and time. She has normal strength. No cranial nerve deficit.    Sensory exam - increase pinprick on the right forehead compared to the left forehead  Component  Latest Ref Rng & Units 12/18/2016 12/25/2016  WBC     3.4 - 10.8 x10E3/uL 8.9   RBC     3.77 - 5.28 x10E6/uL 4.92   Hemoglobin     11.1 - 15.9 g/dL 14.2   HCT     34.0 - 46.6 % 41.5   MCV     79 - 97 fL 84   MCH     26.6 - 33.0 pg 28.9   MCHC     31.5 - 35.7 g/dL 34.2   RDW     12.3 - 15.4 % 12.5   Platelets     150 - 379 x10E3/uL 360   Cholesterol, Total     100 - 199 mg/dL  139  Triglycerides     0 - 149 mg/dL  151 (H)  HDL Cholesterol     >39 mg/dL  36 (L)  VLDL Cholesterol Cal     5 - 40 mg/dL  30  LDL (calc)     0 - 99 mg/dL  73  Total CHOL/HDL Ratio     0.0 - 4.4 ratio  3.9  Varicella  zoster IgG     Immune >165 index <135 (L)   Varicella IgM     0.00 - 0.90 index <0.91   Sed Rate     0 - 32 mm/hr 33 (H)   TSH     0.450 - 4.500 uIU/mL  1.800   Assessment and Plan Ngoc was seen today for numbness.  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

## 2017-05-21 NOTE — Patient Instructions (Signed)
     IF you received an x-ray today, you will receive an invoice from [facility]. Please contact [facility] at [phone] with questions or concerns regarding your invoice.   IF you received labwork today, you will receive an invoice from [facility]. Please contact [facility] at [phone] with questions or concerns regarding your invoice.   Our billing staff will not be able to assist you with questions regarding bills from these companies.  You will be contacted with the lab results as soon as they are available. The fastest way to get your results is to activate your My Chart account. Instructions are located on the last page of this paperwork. If you have not heard from us regarding the results in 2 weeks, please contact this office.     

## 2017-05-22 LAB — LYME AB/WESTERN BLOT REFLEX
LYME DISEASE AB, QUANT, IGM: <0.8 index (ref 0.00–0.79)
Lyme IgG/IgM Ab: <0.91 {ISR} (ref 0.00–0.90)

## 2017-05-28 ENCOUNTER — Encounter: Payer: Self-pay | Admitting: Family Medicine

## 2017-05-29 ENCOUNTER — Telehealth: Payer: Self-pay | Admitting: Family Medicine

## 2017-05-29 NOTE — Telephone Encounter (Signed)
She called in and was given her results from 05/21/17 by Dr. Nolon Rod.  Her Lyme disease labs are normal.  I put a letter in the mail.  She was asking about the status of the neurology referral.   I let her know it takes 2-3 weeks for the neurology office to get in  Touch with her.   The referral is from 05/21/17.   I instructed her to call us back if she had not heard anything from the neurologist by next Monday 06/04/17.    She agreed with this plan and had no further questions.  (These results were not in the result notes que)

## 2017-06-11 ENCOUNTER — Other Ambulatory Visit: Payer: Self-pay | Admitting: Family Medicine

## 2017-06-11 DIAGNOSIS — H93291 Other abnormal auditory perceptions, right ear: Secondary | ICD-10-CM | POA: Insufficient documentation

## 2017-06-11 DIAGNOSIS — G518 Other disorders of facial nerve: Secondary | ICD-10-CM

## 2017-06-11 DIAGNOSIS — H9201 Otalgia, right ear: Secondary | ICD-10-CM | POA: Diagnosis not present

## 2017-06-11 DIAGNOSIS — M2669 Other specified disorders of temporomandibular joint: Secondary | ICD-10-CM | POA: Diagnosis not present

## 2017-06-19 DIAGNOSIS — M791 Myalgia, unspecified site: Secondary | ICD-10-CM | POA: Diagnosis not present

## 2017-07-10 DIAGNOSIS — M791 Myalgia, unspecified site: Secondary | ICD-10-CM | POA: Diagnosis not present

## 2017-07-17 DIAGNOSIS — H04209 Unspecified epiphora, unspecified lacrimal gland: Secondary | ICD-10-CM | POA: Diagnosis not present

## 2017-07-17 DIAGNOSIS — H5213 Myopia, bilateral: Secondary | ICD-10-CM | POA: Diagnosis not present

## 2017-07-17 DIAGNOSIS — H538 Other visual disturbances: Secondary | ICD-10-CM | POA: Diagnosis not present

## 2017-07-24 DIAGNOSIS — M791 Myalgia, unspecified site: Secondary | ICD-10-CM | POA: Diagnosis not present

## 2017-08-01 ENCOUNTER — Ambulatory Visit: Payer: BLUE CROSS/BLUE SHIELD | Admitting: Diagnostic Neuroimaging

## 2017-08-01 ENCOUNTER — Encounter: Payer: Self-pay | Admitting: Diagnostic Neuroimaging

## 2017-08-01 ENCOUNTER — Telehealth: Payer: Self-pay | Admitting: Diagnostic Neuroimaging

## 2017-08-01 DIAGNOSIS — G4489 Other headache syndrome: Secondary | ICD-10-CM

## 2017-08-01 MED ORDER — TOPIRAMATE 50 MG PO TABS: 50.0000 mg | ORAL_TABLET | Freq: Two times a day (BID) | ORAL | 12 refills | Status: DC

## 2017-08-01 NOTE — Telephone Encounter (Signed)
[employer]: NPR via their [employer] website order sent to GI. They will reach out to the pt to schedule.

## 2017-08-01 NOTE — Progress Notes (Signed)
[facility]  PATIENT: Monica Steele [name] DOB: Mar 18, 1986  REFERRING CLINICIAN: Burnett Harry, MD HISTORY FROM: patient (via interpreter) REASON FOR VISIT: new consult    HISTORICAL  CHIEF COMPLAINT:  Chief Complaint  Patient presents with  . NP [place]  Neuropathy of facial nerve    hit head last yr.  Has had intermittent headache, pain in that R pariteal area.  Daily to every 2 days. Takes gabapentin and ibuprofen prn    HISTORY OF PRESENT ILLNESS:   32 year old female here for evaluation of post traumatic headaches.  January 2018 patient was at home, wearing slippers, when she slipped and fell down.  She landed on the right side of her head.  She was dizzy and had blurred vision.  Patient went to the emergency room for evaluation, had CT scan of the head which was negative.  Ever since that time she has had intermittent right-sided headaches with right scalp and facial numbness.  Symptoms are intermittent lasting 30 minutes at a time.  Symptoms can occur every other day, sometimes up to 5 times per week.  Patient was started on gabapentin 300 mg, 2 tablets at bedtime, which has provided some mild relief.  Sometimes she takes ibuprofen during the daytime when she has severe symptoms.  Since patient's injury, she also noted clicking in her right temporomandibular joint, saw a chiropractor, and was prescribed mouthguard, and received electrical stimulation treatments.  This is not helped.  She has not seen a dentist or oral surgeon.  Patient denies any problem on the left side of her face, arms, legs, balance or walking.  No history of headaches or migraine.   REVIEW OF SYSTEMS: Full 14 system review of systems performed and negative with exception of: Runny nose.  ALLERGIES: No Known Allergies  HOME MEDICATIONS: Outpatient Medications Prior to Visit  Medication Sig Dispense Refill  . gabapentin (NEURONTIN) 300 MG capsule Take 1 capsule (300 mg total)  by mouth at bedtime. Increase to 2 capsule at bedtime in one week. 90 capsule 1  . ibuprofen (ADVIL,MOTRIN) 600 MG tablet Take 1 tablet (600 mg total) by mouth every 6 (six) hours. 30 tablet 0   No facility-administered medications prior to visit.     PAST MEDICAL HISTORY: Past Medical History:  Diagnosis Date  . Dry eyes   . H/O seasonal allergies   . Headache     PAST SURGICAL HISTORY: Past Surgical History:  Procedure Laterality Date  . NO PAST SURGERIES      FAMILY HISTORY: No family history on file.  SOCIAL HISTORY:  Social History   Socioeconomic History  . Marital status: Single    Spouse name: Not on file  . Number of children: Not on file  . Years of education: Not on file  . Highest education level: Not on file  Occupational History  . Not on file  Social Needs  . Financial resource strain: Not on file  . Food insecurity:    Worry: Not on file    Inability: Not on file  . Transportation needs:    Medical: Not on file    Non-medical: Not on file  Tobacco Use  . Smoking status: Never Smoker  . Smokeless tobacco: Never Used  Substance and Sexual Activity  . Alcohol use: No    Comment: occas  . Drug use: No  . Sexual activity: Not Currently  Lifestyle  . Physical activity:    Days per week: Not on file  Minutes per session: Not on file  . Stress: Not on file  Relationships  . Social connections:    Talks on phone: Not on file    Gets together: Not on file    Attends religious service: Not on file    Active member of club or organization: Not on file    Attends meetings of clubs or organizations: Not on file    Relationship status: Not on file  . Intimate partner violence:    Fear of current or ex partner: Not on file    Emotionally abused: Not on file    Physically abused: Not on file    Forced sexual activity: Not on file  Other Topics Concern  . Not on file  Social History Narrative  . Not on file     PHYSICAL EXAM  GENERAL  EXAM/CONSTITUTIONAL: Vitals:  Vitals:   08/01/17 0801  BP: 115/66  Pulse: 77  Weight: 160 lb (72.6 kg)  Height: 5\' 1"  (1.549 m)     Body mass index is 30.23 kg/m.  No exam data present  Patient is in no distress; well developed, nourished and groomed; neck is supple  SLIGHT POP AND CONDYLE DISPLACEMENT ON RIGHT SIDE WITH JAW OPEN AND CLOSE  CARDIOVASCULAR:  Examination of carotid arteries is normal; no carotid bruits  Regular rate and rhythm, no murmurs  Examination of peripheral vascular system by observation and palpation is normal  EYES:  Ophthalmoscopic exam of optic discs and posterior segments is normal; no papilledema or hemorrhages  MUSCULOSKELETAL:  Gait, strength, tone, movements noted in Neurologic exam below  NEUROLOGIC: MENTAL STATUS:  No flowsheet data found.  awake, alert, oriented to person, place and time  recent and remote memory intact  normal attention and concentration  language fluent, comprehension intact, naming intact,   fund of knowledge appropriate  CRANIAL NERVE:   2nd - no papilledema on fundoscopic exam  2nd, 3rd, 4th, 6th - pupils equal and reactive to light, visual fields full to confrontation, extraocular muscles intact, no nystagmus  5th - facial sensation symmetric  7th - facial strength symmetric  8th - hearing intact  9th - palate elevates symmetrically, uvula midline  11th - shoulder shrug symmetric  12th - tongue protrusion midline  MOTOR:   normal bulk and tone, full strength in the BUE, BLE  SENSORY:   normal and symmetric to light touch, temperature, vibration  COORDINATION:   finger-nose-finger, fine finger movements normal  REFLEXES:   deep tendon reflexes present and symmetric  GAIT/STATION:   narrow based gait    DIAGNOSTIC DATA (LABS, IMAGING, TESTING) - I reviewed patient records, labs, notes, testing and imaging myself where available.  Lab Results  Component Value Date    WBC 8.9 12/18/2016   HGB 14.2 12/18/2016   HCT 41.5 12/18/2016   MCV 84 12/18/2016   PLT 360 12/18/2016   No results found for: NA, K, CL, CO2, GLUCOSE, BUN, CREATININE, CALCIUM, PROT, ALBUMIN, AST, ALT, ALKPHOS, BILITOT, GFRNONAA, GFRAA Lab Results  Component Value Date   CHOL 139 12/25/2016   HDL 36 (L) 12/25/2016   LDLCALC 73 12/25/2016   TRIG 151 (H) 12/25/2016   CHOLHDL 3.9 12/25/2016   No results found for: HGBA1C No results found for: VITAMINB12 Lab Results  Component Value Date   TSH 1.800 12/25/2016    03/27/16 CT head - No acute intracranial abnormalities.    ASSESSMENT AND PLAN  32 y.o. year old female here with intermittent right-sided headaches and  right-sided facial and scalp numbness since trauma and closed head injury in January 2018.   Dx: post-traumatic headache  1. Other headache syndrome      PLAN:  POST-TRAUMATIC HEADACHES + INTERMITTENT FACIAL NUMBNESS - check MRI brain / IAC (with and without) --> rule out other causes of facial numbness - trial of topiramate 50mg  at bedtime; after 1-2 weeks increase to twice a day - stop gabapentin (not effective) - may consider amitriptyline or duloxetine in future  RIGHT TMJ DYSFUNCTION - follow up with dentist / oral surgeon  Orders Placed This Encounter  Procedures  . MR BRAIN/IAC W WO CONTRAST   Meds ordered this encounter  Medications  . topiramate (TOPAMAX) 50 MG tablet    Sig: Take 1 tablet (50 mg total) by mouth 2 (two) times daily.    Dispense:  60 tablet    Refill:  12   Return in about 4 months (around 12/02/2017) for with NP Clabe Seal).    Penni Bombard, MD 05/05/2444, 9:50 AM Certified in Neurology, Neurophysiology and Neuroimaging  [facility] [address] [place], [place] [address] [phone]

## 2017-08-01 NOTE — Patient Instructions (Signed)
POST-TRAUMATIC HEADACHES + INTERMITTENT FACIAL NUMBNESS - check MRI brain / IAC (with and without) - trial of topiramate 50mg  at bedtime; after 1-2 weeks increase to twice a day - stop gabapentin (not effective)  RIGHT TMJ DYSFUNCTION - follow up with dentist / oral surgeon

## 2017-08-14 ENCOUNTER — Inpatient Hospital Stay: Admission: RE | Admit: 2017-08-14 | Payer: Self-pay | Source: Ambulatory Visit

## 2017-08-27 ENCOUNTER — Ambulatory Visit
Admission: RE | Admit: 2017-08-27 | Discharge: 2017-08-27 | Disposition: A | Discharge status: Home / Self Care | Payer: BLUE CROSS/BLUE SHIELD | Source: Ambulatory Visit | Attending: Diagnostic Neuroimaging | Admitting: Diagnostic Neuroimaging

## 2017-08-27 ENCOUNTER — Ambulatory Visit: Payer: BLUE CROSS/BLUE SHIELD | Admitting: Family Medicine

## 2017-08-27 DIAGNOSIS — G4489 Other headache syndrome: Secondary | ICD-10-CM

## 2017-08-27 DIAGNOSIS — R51 Headache: Secondary | ICD-10-CM | POA: Diagnosis not present

## 2017-08-27 MED ORDER — GADOBENATE DIMEGLUMINE 529 MG/ML IV SOLN
15.0000 mL | Freq: Once | INTRAVENOUS | Status: AC | PRN
  Administered 2017-08-27: 15 mL via INTRAVENOUS

## 2017-08-28 ENCOUNTER — Telehealth: Payer: Self-pay | Admitting: *Deleted

## 2017-08-28 NOTE — Telephone Encounter (Signed)
[employer], reached [place] interpreter [phone], [place]. She reached patient by phone, and this RN informed patient that her MRI Brain and IAC result was unremarkable. Advised she continue with Dr Gladstone Lighter plan to see a dentist, continue taking topiramate. Through interpreter the patient stated she is seeing dentist, taking topiramate which is helping her headaches. Patient stated she thinks her issue is with her jaw; this RN advised she continue to FU with dentist. Gave her [facility] FU date/time. The patient had no questions, verbalized understanding.

## 2018-01-01 ENCOUNTER — Ambulatory Visit: Payer: BLUE CROSS/BLUE SHIELD | Admitting: Adult Health

## 2018-03-10 ENCOUNTER — Encounter (HOSPITAL_COMMUNITY): Payer: Self-pay | Admitting: Emergency Medicine

## 2018-03-10 ENCOUNTER — Ambulatory Visit (HOSPITAL_COMMUNITY)
Admission: EM | Admit: 2018-03-10 | Discharge: 2018-03-10 | Disposition: A | Discharge status: Home / Self Care | Payer: BLUE CROSS/BLUE SHIELD | Attending: Internal Medicine | Admitting: Internal Medicine

## 2018-03-10 DIAGNOSIS — J9801 Acute bronchospasm: Secondary | ICD-10-CM | POA: Diagnosis not present

## 2018-03-10 DIAGNOSIS — J3089 Other allergic rhinitis: Secondary | ICD-10-CM | POA: Insufficient documentation

## 2018-03-10 MED ORDER — IPRATROPIUM-ALBUTEROL 0.5-2.5 (3) MG/3ML IN SOLN
RESPIRATORY_TRACT | Status: AC
  Filled 2018-03-10: qty 3

## 2018-03-10 MED ORDER — IPRATROPIUM-ALBUTEROL 0.5-2.5 (3) MG/3ML IN SOLN
3.0000 mL | Freq: Once | RESPIRATORY_TRACT | Status: AC
  Administered 2018-03-10: 3 mL via RESPIRATORY_TRACT

## 2018-03-10 MED ORDER — METHYLPREDNISOLONE 4 MG PO TBPK: ORAL_TABLET | ORAL | 0 refills | Status: DC

## 2018-03-10 MED ORDER — FEXOFENADINE HCL 180 MG PO TABS: 180.0000 mg | ORAL_TABLET | Freq: Every day | ORAL | 0 refills | Status: DC

## 2018-03-10 MED ORDER — BENZONATATE 200 MG PO CAPS: 200.0000 mg | ORAL_CAPSULE | Freq: Three times a day (TID) | ORAL | 0 refills | Status: DC | PRN

## 2018-03-10 NOTE — ED Triage Notes (Signed)
Pt sts cough and itchy throat

## 2018-03-10 NOTE — ED Provider Notes (Signed)
[facility]    CSN: [id number] Arrival date & time: 03/10/18  1150     History   Chief Complaint Chief Complaint  Patient presents with  . Cough    HPI Monica Steele is a 32 y.o. female.   Who present with rhinitis, cough, itchy throat, stuffy nose x 3 weeks. Denies fever, chills or sweats. No hx of asthma. 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 Her son who goes to daycare started with URI and cough and he has been to his Dr x 2 due to the cough. She denies having hx of asthma.     Past Medical History:  Diagnosis Date  . Dry eyes   . H/O seasonal allergies   . Headache     Patient Active Problem List   Diagnosis Date Noted  . 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 NSVD (normal spontaneous vaginal delivery) 02/28/2013  . Second-degree perineal laceration, with delivery 02/28/2013    Past Surgical History:  Procedure Laterality Date  . NO PAST SURGERIES      OB History    Gravida  2   Para  1   Term  1   Preterm  0   AB  1   Living  1     SAB  1   TAB  0   Ectopic  0   Multiple  0   Live Births  1            Home Medications    Prior to Admission medications   Medication Sig Start Date End Date Taking? Authorizing Provider  gabapentin (NEURONTIN) 300 MG capsule Take 1 capsule (300 mg total) by mouth at bedtime. Increase to 2 capsule at bedtime in one week. 06/12/17   Forrest Moron, MD  ibuprofen (ADVIL,MOTRIN) 600 MG tablet Take 1 tablet (600 mg total) by mouth every 6 (six) hours. 03/27/16   Junius Creamer, NP  topiramate (TOPAMAX) 50 MG tablet Take 1 tablet (50 mg total) by mouth 2 (two) times daily. 08/01/17   Penumalli, Earlean Polka, MD    Family History History reviewed. No pertinent family history.  Social History Social History   Tobacco Use  . Smoking status: Never Smoker  . Smokeless tobacco: Never Used  Substance Use Topics  . Alcohol use: No    Comment: occas  .  Drug use: No     Allergies   Gadolinium derivatives   Review of Systems Review of Systems  Constitutional: Negative for chills, diaphoresis and fever.  HENT: Positive for postnasal drip, rhinorrhea and sneezing. Negative for congestion, ear discharge, ear pain, sore throat and trouble swallowing.   Eyes: Positive for discharge and itching.       Watery  Respiratory: Positive for cough. Negative for chest tightness, shortness of breath, wheezing and stridor.   Cardiovascular: Negative for chest pain.  Gastrointestinal: Negative for nausea and vomiting.  Skin: Negative for rash.  Hematological: Negative for adenopathy.   Physical Exam Triage Vital Signs ED Triage Vitals  Enc Vitals Group     BP 03/10/18 1316 128/62     Pulse Rate 03/10/18 1316 78     Resp 03/10/18 1316 18     Temp 03/10/18 1316 98.2 F (36.8 C)     Temp Source 03/10/18 1316 Oral     SpO2 03/10/18 1316 98 %     Weight --  Height --      Head Circumference --      Peak Flow --      Pain Score 03/10/18 1317 5     Pain Loc --      Pain Edu? --      Excl. in [place]? --    No data found.  Updated Vital Signs BP 128/62 (BP Location: Left Arm)   Pulse 78   Temp 98.2 F (36.8 C) (Oral)   Resp 18   SpO2 98%   Visual Acuity Right Eye Distance:   Left Eye Distance:   Bilateral Distance:    Right Eye Near:   Left Eye Near:    Bilateral Near:     Physical Exam Vitals signs and nursing note reviewed.  Constitutional:      General: She is not in acute distress.    Appearance: Normal appearance. She is not toxic-appearing.  HENT:     Right Ear: Tympanic membrane, ear canal and external ear normal.     Left Ear: Tympanic membrane, ear canal and external ear normal.     Nose: Rhinorrhea present. No congestion.     Mouth/Throat:     Mouth: Mucous membranes are moist.  Eyes:     General: No scleral icterus.       Right eye: No discharge.        Left eye: No discharge.     Conjunctiva/sclera:  Conjunctivae normal.  Neck:     Musculoskeletal: Neck supple.  Cardiovascular:     Rate and Rhythm: Normal rate. Rhythm irregular.     Heart sounds: No murmur.  Pulmonary:     Effort: No respiratory distress.     Breath sounds: No rhonchi or rales.     Comments: Deep breaths provoked her cough attacks while in the room. With forced exhalation I could hear faint wheezing on bases.  Musculoskeletal: Normal range of motion.  Lymphadenopathy:     Cervical: No cervical adenopathy.  Skin:    General: Skin is warm and dry.  Neurological:     Mental Status: She is alert and oriented to person, place, and time.     Gait: Gait normal.  Psychiatric:        Mood and Affect: Mood normal.        Behavior: Behavior normal.        Thought Content: Thought content normal.        Judgment: Judgment normal.    UC Treatments / Results  Labs (all labs ordered are listed, but only abnormal results are displayed) Labs Reviewed - No data to display  EKG None  Radiology No results found.  Procedures Procedures   Medications Ordered in UC Medications  ipratropium-albuterol (DUONEB) 0.5-2.5 (3) MG/3ML nebulizer solution 3 mL (has no administration in time range)   Initial Impression / Assessment and Plan / UC Course  I have reviewed the triage vital signs and the nursing notes. He cough attacks calmed down after the duo neb. Her lung exam post neb was normal. I believe she is having reactive airway from allergies. I placed her on Medrol dose pack, Tessalon perless prn cough,  And Allegra to take qd.  She was explained if she gets a fever and cough continues, she needs to be seen again.  Final Clinical Impressions(s) / UC Diagnoses   Final diagnoses:  None   Discharge Instructions   None    ED Prescriptions    None     Controlled Substance  Prescriptions [place] Controlled Substance Registry consulted?    Shelby Mattocks, PA-C 03/10/18 1616

## 2018-03-10 NOTE — Discharge Instructions (Addendum)
I dont find an infection in your lungs, I believe your cough is from some kind of allergy and is causing the cough( bronchospasms).   The Allegra will help the sneezing, itchy throat and eyes.  The Medrol dose pack will help with the inflammation in your breathing passages called bronchi.  The benzonate yellow capsules is for the cough.   Please have a follow up with your family Dr next week if you get worse in 68- 62h

## 2018-03-20 ENCOUNTER — Other Ambulatory Visit (HOSPITAL_COMMUNITY): Payer: Self-pay | Admitting: Internal Medicine

## 2018-03-21 NOTE — Telephone Encounter (Signed)
Benzonate capsules refill

## 2018-04-25 ENCOUNTER — Other Ambulatory Visit (HOSPITAL_COMMUNITY): Payer: Self-pay | Admitting: Internal Medicine

## 2018-05-04 ENCOUNTER — Other Ambulatory Visit (HOSPITAL_COMMUNITY): Payer: Self-pay | Admitting: Internal Medicine

## 2018-05-14 DIAGNOSIS — L918 Other hypertrophic disorders of the skin: Secondary | ICD-10-CM | POA: Diagnosis not present

## 2018-05-14 DIAGNOSIS — Z113 Encounter for screening for infections with a predominantly sexual mode of transmission: Secondary | ICD-10-CM | POA: Diagnosis not present

## 2018-05-14 DIAGNOSIS — N915 Oligomenorrhea, unspecified: Secondary | ICD-10-CM | POA: Diagnosis not present

## 2018-05-14 DIAGNOSIS — Z6831 Body mass index (BMI) 31.0-31.9, adult: Secondary | ICD-10-CM | POA: Diagnosis not present

## 2018-05-14 DIAGNOSIS — N926 Irregular menstruation, unspecified: Secondary | ICD-10-CM | POA: Diagnosis not present

## 2018-05-14 DIAGNOSIS — Z01411 Encounter for gynecological examination (general) (routine) with abnormal findings: Secondary | ICD-10-CM | POA: Diagnosis not present

## 2018-06-04 DIAGNOSIS — N840 Polyp of corpus uteri: Secondary | ICD-10-CM | POA: Diagnosis not present

## 2018-06-04 DIAGNOSIS — N915 Oligomenorrhea, unspecified: Secondary | ICD-10-CM | POA: Diagnosis not present

## 2018-06-04 DIAGNOSIS — E282 Polycystic ovarian syndrome: Secondary | ICD-10-CM | POA: Diagnosis not present

## 2018-08-10 ENCOUNTER — Other Ambulatory Visit: Payer: Self-pay | Admitting: Internal Medicine

## 2018-09-01 ENCOUNTER — Encounter (HOSPITAL_COMMUNITY): Payer: Self-pay

## 2018-09-01 ENCOUNTER — Other Ambulatory Visit: Payer: Self-pay

## 2018-09-01 ENCOUNTER — Ambulatory Visit (HOSPITAL_COMMUNITY)
Admission: EM | Admit: 2018-09-01 | Discharge: 2018-09-01 | Disposition: A | Discharge status: Home / Self Care | Payer: BLUE CROSS/BLUE SHIELD | Attending: Emergency Medicine | Admitting: Emergency Medicine

## 2018-09-01 DIAGNOSIS — R142 Eructation: Secondary | ICD-10-CM

## 2018-09-01 DIAGNOSIS — M542 Cervicalgia: Secondary | ICD-10-CM

## 2018-09-01 DIAGNOSIS — K5900 Constipation, unspecified: Secondary | ICD-10-CM

## 2018-09-01 MED ORDER — OMEPRAZOLE 20 MG PO CPDR: 20.0000 mg | DELAYED_RELEASE_CAPSULE | Freq: Every day | ORAL | 0 refills | Status: DC

## 2018-09-01 MED ORDER — ACETAMINOPHEN 500 MG PO TABS: 500.0000 mg | ORAL_TABLET | Freq: Four times a day (QID) | ORAL | 0 refills | Status: DC | PRN

## 2018-09-01 MED ORDER — POLYETHYLENE GLYCOL 3350 17 GM/SCOOP PO POWD: 17.0000 g | Freq: Every day | ORAL | 0 refills | Status: DC

## 2018-09-01 NOTE — ED Provider Notes (Signed)
[place]    CSN: [id number] Arrival date & time: 09/01/18  1224     History   Chief Complaint Chief Complaint  Patient presents with  . Appointment    1:00  . Abdominal Pain    HPI [facility] Monica Steele is a 33 y.o. female.   [facility] Monica Steele presents with multiple complaints.  Complains of months of sensation of bad digestion with eating. Worse after she lays down. Burps a lot. Feels likes she has difficulty breathing even with her symptoms. Feels fatigued. Symptoms primarily 1-2 hours after eating. Currently doesn't have any symptoms. Denies any nausea or vomiting. No chest pain. Normal BM's, last was today. Has to strain some to pass bowel movements. Has a Bm every 1-2 days. Takes stool softeners as needed for constipation which do help. No medications for belching. No urinary symptoms. States also has left posterior shoulder and neck pain at times. Intermittent. No numbness tingling or weakness. No injury. Hasn't take any medications for symptoms. Sensation of tingling to right upper back. No pain, no injury. No rash. Doesn't follow with a PCP.   [place]namese video interpreter used to collect history and physical exam.    ROS per HPI, negative if not otherwise mentioned.      Past Medical History:  Diagnosis Date  . Dry eyes   . H/O seasonal allergies   . Headache     Patient Active Problem List   Diagnosis Date Noted  . 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 NSVD (normal spontaneous vaginal delivery) 02/28/2013  . Second-degree perineal laceration, with delivery 02/28/2013    Past Surgical History:  Procedure Laterality Date  . NO PAST SURGERIES      OB History    Gravida  2   Para  1   Term  1   Preterm  0   AB  1   Living  1     SAB  1   TAB  0   Ectopic  0   Multiple  0   Live Births  1            Home Medications    Prior to Admission medications   Medication Sig Start Date End Date Taking?  Authorizing Provider  acetaminophen (TYLENOL) 500 MG tablet Take 1 tablet (500 mg total) by mouth every 6 (six) hours as needed. 09/01/18   Zigmund Gottron, NP  ibuprofen (ADVIL,MOTRIN) 600 MG tablet Take 1 tablet (600 mg total) by mouth every 6 (six) hours. 03/27/16   Junius Creamer, NP  omeprazole (PRILOSEC) 20 MG capsule Take 1 capsule (20 mg total) by mouth daily. 09/01/18   Augusto Gamble B, NP  polyethylene glycol powder (GLYCOLAX/MIRALAX) 17 GM/SCOOP powder Take 17 g by mouth daily. 09/01/18   Zigmund Gottron, NP  topiramate (TOPAMAX) 50 MG tablet Take 1 tablet (50 mg total) by mouth 2 (two) times daily. 08/01/17   Penumalli, Earlean Polka, MD  fexofenadine (ALLEGRA ALLERGY) 180 MG tablet Take 1 tablet (180 mg total) by mouth daily. 03/10/18 09/01/18  Rodriguez-Southworth, Sunday Spillers, PA-C  gabapentin (NEURONTIN) 300 MG capsule Take 1 capsule (300 mg total) by mouth at bedtime. Increase to 2 capsule at bedtime in one week. 06/12/17 09/01/18  Forrest Moron, MD    Family History Family History  Problem Relation Age of Onset  . Healthy Mother   . Healthy Father     Social History Social History   Tobacco Use  .  Smoking status: Never Smoker  . Smokeless tobacco: Never Used  Substance Use Topics  . Alcohol use: No    Comment: occas  . Drug use: No     Allergies   Gadolinium derivatives   Review of Systems Review of Systems   Physical Exam Triage Vital Signs ED Triage Vitals  Enc Vitals Group     BP 09/01/18 1256 119/69     Pulse Rate 09/01/18 1256 93     Resp 09/01/18 1256 17     Temp 09/01/18 1256 98.3 F (36.8 C)     Temp Source 09/01/18 1256 Oral     SpO2 09/01/18 1256 100 %     Weight --      Height --      Head Circumference --      Peak Flow --      Pain Score 09/01/18 1250 4     Pain Loc --      Pain Edu? --      Excl. in [place]? --    No data found.  Updated Vital Signs BP 119/69 (BP Location: Left Arm)   Pulse 93   Temp 98.3 F (36.8 C) (Oral)   Resp 17   LMP  08/11/2018 (Exact Date)   SpO2 100%    Physical Exam Constitutional:      General: She is not in acute distress.    Appearance: She is well-developed.  Cardiovascular:     Rate and Rhythm: Normal rate and regular rhythm.     Heart sounds: Normal heart sounds.  Pulmonary:     Effort: Pulmonary effort is normal.     Breath sounds: Normal breath sounds.  Abdominal:     General: Abdomen is flat.     Palpations: Abdomen is soft.     Tenderness: There is no abdominal tenderness.  Musculoskeletal:     Cervical back: Normal.     Comments: No visible rash to area of tingling  Skin:    General: Skin is warm and dry.  Neurological:     Mental Status: She is alert and oriented to person, place, and time.      UC Treatments / Results  Labs (all labs ordered are listed, but only abnormal results are displayed) Labs Reviewed - No data to display  EKG None  Radiology No results found.  Procedures Procedures (including critical care time)  Medications Ordered in UC Medications - No data to display  Initial Impression / Assessment and Plan / UC Course  I have reviewed the triage vital signs and the nursing notes.  Pertinent labs & imaging results that were available during my care of the patient were reviewed by me and considered in my medical decision making (see chart for details).     Months of symptoms without any acute findings or red flag findings here today. Vitals stable. Will start with miralax and omeprazole. Encouraged follow up with PCP. Return precautions provided. Patient verbalized understanding and agreeable to plan.     Final Clinical Impressions(s) / UC Diagnoses   Final diagnoses:  Constipation, unspecified constipation type  Belching  Neck pain on left side   Discharge Instructions   None    ED Prescriptions    Medication Sig Dispense Auth. Provider   acetaminophen (TYLENOL) 500 MG tablet Take 1 tablet (500 mg total) by mouth every 6 (six) hours  as needed. 30 tablet Augusto Gamble B, NP   polyethylene glycol powder (GLYCOLAX/MIRALAX) 17 GM/SCOOP powder Take 17 g  by mouth daily. 255 g Augusto Gamble B, NP   omeprazole (PRILOSEC) 20 MG capsule Take 1 capsule (20 mg total) by mouth daily. 30 capsule Zigmund Gottron, NP     Controlled Substance Prescriptions [place] Controlled Substance Registry consulted? Not Applicable   Zigmund Gottron, NP 09/01/18 1617

## 2018-09-01 NOTE — ED Triage Notes (Signed)
Patient presents to Urgent Care with complaints of abdominal painn intermittently since about a month ago. Patient reports she feels like her food is not digesting and causing bloating. Pt also endorses back pain for a month. [place] interpreter used for triage translation.

## 2018-09-18 ENCOUNTER — Ambulatory Visit: Payer: BLUE CROSS/BLUE SHIELD | Admitting: Adult Health

## 2018-10-01 ENCOUNTER — Ambulatory Visit: Payer: BLUE CROSS/BLUE SHIELD | Admitting: Family Medicine

## 2018-10-01 ENCOUNTER — Encounter: Payer: Self-pay | Admitting: Gastroenterology

## 2018-10-01 ENCOUNTER — Encounter: Payer: Self-pay | Admitting: Family Medicine

## 2018-10-01 ENCOUNTER — Other Ambulatory Visit: Payer: Self-pay

## 2018-10-01 VITALS — BP 105/66 | HR 78 | Temp 98.8°F | Resp 16 | Wt 157.0 lb

## 2018-10-01 DIAGNOSIS — R1312 Dysphagia, oropharyngeal phase: Secondary | ICD-10-CM

## 2018-10-01 DIAGNOSIS — K219 Gastro-esophageal reflux disease without esophagitis: Secondary | ICD-10-CM

## 2018-10-01 DIAGNOSIS — K5909 Other constipation: Secondary | ICD-10-CM

## 2018-10-01 DIAGNOSIS — Z131 Encounter for screening for diabetes mellitus: Secondary | ICD-10-CM

## 2018-10-01 DIAGNOSIS — E781 Pure hyperglyceridemia: Secondary | ICD-10-CM

## 2018-10-01 MED ORDER — OMEPRAZOLE 20 MG PO CPDR: 20.0000 mg | DELAYED_RELEASE_CAPSULE | Freq: Every day | ORAL | 11 refills | Status: DC

## 2018-10-01 MED ORDER — POLYETHYLENE GLYCOL 3350 17 GM/SCOOP PO POWD: 17.0000 g | Freq: Every day | ORAL | 11 refills | Status: AC

## 2018-10-01 NOTE — Patient Instructions (Signed)
° ° ° °  If you have lab work done today you will be contacted with your lab results within the next 2 weeks.  If you have not heard from us then please contact us. The fastest way to get your results is to register for My Chart. ° ° °IF you received an x-ray today, you will receive an invoice from [facility]. Please contact  Radiology at [phone] with questions or concerns regarding your invoice.  ° °IF you received labwork today, you will receive an invoice from [facility]. Please contact [facility] at [phone] with questions or concerns regarding your invoice.  ° °Our billing staff will not be able to assist you with questions regarding bills from these companies. ° °You will be contacted with the lab results as soon as they are available. The fastest way to get your results is to activate your My Chart account. Instructions are located on the last page of this paperwork. If you have not heard from us regarding the results in 2 weeks, please contact this office. °  ° ° ° °

## 2018-10-01 NOTE — Progress Notes (Signed)
 Established Patient Office Visit  Subjective:  Patient ID: Monica Steele, female    DOB: 06/19/1985  Age: 33 y.o. MRN: [id number]  CC:  Chief Complaint  Patient presents with  . Gastroesophageal Reflux    pt states when she eats she feels gas in her stomach and chest. Pt states it feels like acid in the throat also 3 hours after she eats. x 1 month     HPI [name] presents for stomach problems  Using pacific translator ID [id number] able to communicate  GERD/DYSPHAGIA Patient stats that she has GERD  When she eats she feels gas in her stomach and chest She feels like she has burning sensation in he rthroat and it lasts about 3 hours Onset of symptoms a months ago She eats a diet of fried foods and spicy foods every day.  She does not drink soda or tea and does not smoke She does not wear tight clothing  She took prilosec for a month without much improvement She takes ibuprofen occasionally   She also has difficulty with swallowing and feels like something is stuck in her throat She has a family history of reflux disease No family history of stomach cancer  The acid feeling in her stomach and throat does not cause her to cough or wheeze    Constipation She also has constipation and has to take miralax to have a BM She drinks adequate water She has no blood per rectum No lower abdominal pain    Past Medical History:  Diagnosis Date  . Dry eyes   . H/O seasonal allergies   . Headache     Past Surgical History:  Procedure Laterality Date  . NO PAST SURGERIES      Family History  Problem Relation Age of Onset  . Healthy Mother   . Healthy Father     Social History   Socioeconomic History  . Marital status: Single    Spouse name: Not on file  . Number of children: Not on file  . Years of education: Not on file  . Highest education level: Not on file  Occupational History  . Not on file  Social Needs  . Financial resource strain: Not on  file  . Food insecurity    Worry: Not on file    Inability: Not on file  . Transportation needs    Medical: Not on file    Non-medical: Not on file  Tobacco Use  . Smoking status: Never Smoker  . Smokeless tobacco: Never Used  Substance and Sexual Activity  . Alcohol use: No    Comment: occas  . Drug use: No  . Sexual activity: Not Currently  Lifestyle  . Physical activity    Days per week: Not on file    Minutes per session: Not on file  . Stress: Not on file  Relationships  . Social connections    Talks on phone: Not on file    Gets together: Not on file    Attends religious service: Not on file    Active member of club or organization: Not on file    Attends meetings of clubs or organizations: Not on file    Relationship status: Not on file  . Intimate partner violence    Fear of current or ex partner: Not on file    Emotionally abused: Not on file    Physically abused: Not on file    Forced sexual activity: Not on   file  Other Topics Concern  . Not on file  Social History Narrative   Lives home with husband, Hai Le, and one child.  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  Education 8th grade.  Caffeine one [employer]bucks daily.    Outpatient Medications Prior to Visit  Medication Sig Dispense Refill  . acetaminophen (TYLENOL) 500 MG tablet Take 1 tablet (500 mg total) by mouth every 6 (six) hours as needed. 30 tablet 0  . ibuprofen (ADVIL,MOTRIN) 600 MG tablet Take 1 tablet (600 mg total) by mouth every 6 (six) hours. 30 tablet 0  . topiramate (TOPAMAX) 50 MG tablet Take 1 tablet (50 mg total) by mouth 2 (two) times daily. 60 tablet 12  . omeprazole (PRILOSEC) 20 MG capsule Take 1 capsule (20 mg total) by mouth daily. 30 capsule 0  . polyethylene glycol powder (GLYCOLAX/MIRALAX) 17 GM/SCOOP powder Take 17 g by mouth daily. 255 g 0   No facility-administered medications prior to visit.     Allergies  Allergen Reactions  . Gadolinium Derivatives Nausea And Vomiting     Pt vomited after multihance injection.     ROS Review of Systems Review of Systems  Constitutional: Negative for activity change, appetite change, chills and fever.  HENT: Negative for congestion, nosebleeds, trouble swallowing and voice change.   Respiratory: Negative for cough, shortness of breath and wheezing.   Gastrointestinal: see hpi Genitourinary: Negative for difficulty urinating, dysuria, flank pain and hematuria.  Musculoskeletal: Negative for back pain, joint swelling and neck pain.  Neurological: Negative for dizziness, speech difficulty, light-headedness and numbness.  See HPI. All other review of systems negative.     Objective:    Physical Exam  BP 105/66   Pulse 78   Temp 98.8 F (37.1 C) (Oral)   Resp 16   Wt 157 lb (71.2 kg)   LMP 09/15/2018 (Approximate)   SpO2 95%   BMI 29.66 kg/m  Wt Readings from Last 3 Encounters:  10/01/18 157 lb (71.2 kg)  08/01/17 160 lb (72.6 kg)  05/21/17 159 lb 3.2 oz (72.2 kg)   Physical Exam  Constitutional: Oriented to person, place, and time. Appears well-developed and well-nourished.  HENT:  Head: Normocephalic and atraumatic.  Mouth: no pharyngeal erythema Eyes: Conjunctivae and EOM are normal.  Cardiovascular: Normal rate, regular rhythm, normal heart sounds and intact distal pulses.  No murmur heard. Pulmonary/Chest: Effort normal and breath sounds normal. No stridor. No respiratory distress. Has no wheezes.  Abdomen: nondistended, normoactive bs, soft, mild tenderness in the epigastric, no rebound, no guarding, no masses Neurological: Is alert and oriented to person, place, and time.  Skin: Skin is warm. Capillary refill takes less than 2 seconds.  Psychiatric: Has a normal mood and affect. Behavior is normal. Judgment and thought content normal.    There are no preventive care reminders to display for this patient.  There are no preventive care reminders to display for this patient.  Lab Results  Component  Value Date   TSH 1.800 12/25/2016   Lab Results  Component Value Date   WBC 8.9 12/18/2016   HGB 14.2 12/18/2016   HCT 41.5 12/18/2016   MCV 84 12/18/2016   PLT 360 12/18/2016   No results found for: NA, K, CHLORIDE, CO2, GLUCOSE, BUN, CREATININE, BILITOT, ALKPHOS, AST, ALT, PROT, ALBUMIN, CALCIUM, ANIONGAP, EGFR, GFR Lab Results  Component Value Date   CHOL 139 12/25/2016   Lab Results  Component Value Date   HDL 36 (L)   12/25/2016   Lab Results  Component Value Date   LDLCALC 73 12/25/2016   Lab Results  Component Value Date   TRIG 151 (H) 12/25/2016   Lab Results  Component Value Date   CHOLHDL 3.9 12/25/2016   No results found for: HGBA1C    Assessment & Plan:   Problem List Items Addressed This Visit    None    Visit Diagnoses    Oropharyngeal dysphagia    -  Primary Patient will need EGD Advised to cut back on fried foods    Relevant Orders   H. pylori breath test   Ambulatory referral to Gastroenterology   Gastroesophageal reflux disease, esophagitis presence not specified    - h. Pylori breath test today, continue PPI daily until evaluated for EGD   Relevant Medications   polyethylene glycol powder (GLYCOLAX/MIRALAX) 17 GM/SCOOP powder   omeprazole (PRILOSEC) 20 MG capsule   Other Relevant Orders   H. pylori breath test   Ambulatory referral to Gastroenterology   Hypertriglyceridemia       Relevant Orders   CMP14+EGFR   Lipid panel   Screening for diabetes mellitus       Relevant Orders   Hemoglobin A1c   Other constipation    -  Advised pt to follow up with GI but it seems like she needs more fiber   Relevant Orders   Ambulatory referral to Gastroenterology      Meds ordered this encounter  Medications  . polyethylene glycol powder (GLYCOLAX/MIRALAX) 17 GM/SCOOP powder    Sig: Take 17 g by mouth daily.    Dispense:  510 g    Refill:  11  . omeprazole (PRILOSEC) 20 MG capsule    Sig: Take 1 capsule (20 mg total) by mouth daily.     Dispense:  30 capsule    Refill:  11    Follow-up: No follow-ups on file.    Zoe A Stallings, MD 

## 2018-10-02 LAB — CMP14+EGFR
ALT: 17 IU/L (ref 0–32)
AST: 14 IU/L (ref 0–40)
Albumin/Globulin Ratio: 1.5 (ref 1.2–2.2)
Albumin: 4.7 g/dL (ref 3.8–4.8)
Alkaline Phosphatase: 83 IU/L (ref 39–117)
BUN/Creatinine Ratio: 15 (ref 9–23)
BUN: 10 mg/dL (ref 6–20)
Bilirubin Total: 0.3 mg/dL (ref 0.0–1.2)
CO2: 20 mmol/L (ref 20–29)
Calcium: 10 mg/dL (ref 8.7–10.2)
Chloride: 105 mmol/L (ref 96–106)
Creatinine, Ser: 0.67 mg/dL (ref 0.57–1.00)
GFR calc Af Amer: 134 mL/min/{1.73_m2} (ref >59)
GFR calc non Af Amer: 116 mL/min/{1.73_m2} (ref >59)
Globulin, Total: 3.1 g/dL (ref 1.5–4.5)
Glucose: 89 mg/dL (ref 65–99)
Potassium: 4.2 mmol/L (ref 3.5–5.2)
Sodium: 140 mmol/L (ref 134–144)
Total Protein: 7.8 g/dL (ref 6.0–8.5)

## 2018-10-02 LAB — LIPID PANEL
Chol/HDL Ratio: 3.4 ratio (ref 0.0–4.4)
Cholesterol, Total: 148 mg/dL (ref 100–199)
HDL: 43 mg/dL (ref >39)
LDL Calculated: 89 mg/dL (ref 0–99)
Triglycerides: 81 mg/dL (ref 0–149)
VLDL Cholesterol Cal: 16 mg/dL (ref 5–40)

## 2018-10-02 LAB — HEMOGLOBIN A1C
Est. average glucose Bld gHb Est-mCnc: 100 mg/dL
Hgb A1c MFr Bld: 5.1 % (ref 4.8–5.6)

## 2018-10-02 LAB — H. PYLORI BREATH TEST: H pylori Breath Test: NEGATIVE

## 2018-10-02 LAB — H. PYLORI BREATH COLLECTION

## 2018-10-09 ENCOUNTER — Encounter: Payer: Self-pay | Admitting: Emergency Medicine

## 2018-10-09 NOTE — Progress Notes (Signed)
Unable to reach patient by phone. Sent a lab letter via mail.

## 2018-10-09 NOTE — Progress Notes (Signed)
Unable to reach patient by phone lab letter sent via mail

## 2018-11-03 NOTE — Progress Notes (Addendum)
Referring Provider: Forrest Moron, MD Primary Care Physician:  Monica Moron, MD  Reason for Consultation: Constipation, GERD, dysphasia   IMPRESSION:  Left sided neck pain with associated dysphagia Reflux not resolved on omeprazole 20 mg daily H. pylori breath test 10/01/2018 Chronic constipation with recent change No family history of esophageal cancer, stomach cancer, or colon cancer or polyps  Given her left-sided neck pain I am concerned for oral pharyngeal dysphasia.  I recommended a barium esophagram for further evaluation of her dysphasia and reflux.  However, evaluation by ENT is of high priority.  In the event that this is LPR I have increased her omeprazole to 40 mg twice daily for 8 weeks.  Reviewed treatment of chronic constipation.  Calcium level in July was normal.  Agree with Dr. Nolon Steele recommendation for MiraLAX.  I have recommended a high-fiber diet and increasing water intake to at least 64 ounces daily.  I have also encouraged her to use a stool bulking agent such as Metamucil.     PLAN: Increase omeprazole to 40 mg twice daily x 8 weeks Barium esophagram Referral to ENT given the left-sided neck pain TSH with Dr. Nolon Steele High fiber diet, increase daily water intake Continue Miralax, consider addition of daily stool bulking agent such as Metamucil Follow-up as needed after the ENT evaluation  Please see the "Patient Instructions" section for addition details about the plan.  HPI: Monica Steele is a 33 y.o. female referred by Dr. Nolon Steele. The history is obtained through the patient using a [place] interpreter. I also reviewed her electronic health record. She has chronic headaches.   One month history of the feeling of a lump on the left side of the neck with associated left sided neck pain. Food lodges in the neck when she eats. No odynophagia. No dysphonia. No cough. No regurgitation. Appetite is good. Weight is stable.  Poor energy.    She doesn't feel like she is digesting her food over the last month. She denies heartburn. Frequent sour burps. Taking prilosec 20 mg daily without a change in her symptoms.    Uses ibuprofen occasionally.   She also has years of constipation and has to take miralax to have a BM. Associated straining. No blood or mucous. No associated abdominal pain. She will have a bowel movement every 2-3 days. No recent change in bowel habits.   H pylori breath test negative 10/01/18 Normal CMP including calcium of 10 10/01/18 Last CBC 2018  No abdominal imaging. No prior endoscopic evaluation.   No known family history of colon cancer or polyps. No family history of uterine/endometrial cancer, pancreatic cancer or gastric/stomach cancer.   Past Medical History:  Diagnosis Date  . Dry eyes   . H/O seasonal allergies   . Headache     Past Surgical History:  Procedure Laterality Date  . NO PAST SURGERIES       Allergies as of 11/04/2018 - Review Complete 10/01/2018  Allergen Reaction Noted  . Gadolinium derivatives Nausea And Vomiting 08/27/2017    Family History  Problem Relation Age of Onset  . Healthy Mother   . Healthy Father     Social History   Socioeconomic History  . Marital status: Single    Spouse name: Not on file  . Number of children: Not on file  . Years of education: Not on file  . Highest education level: Not on file  Occupational History  . Not on file  Social Needs  .  Financial resource strain: Not on file  . Food insecurity    Worry: Not on file    Inability: Not on file  . Transportation needs    Medical: Not on file    Non-medical: Not on file  Tobacco Use  . Smoking status: Never Smoker  . Smokeless tobacco: Never Used  Substance and Sexual Activity  . Alcohol use: No    Comment: occas  . Drug use: No  . Sexual activity: Not Currently  Lifestyle  . Physical activity    Days per week: Not on file    Minutes per session: Not on file  .  Stress: Not on file  Relationships  . Social Herbalist on phone: Not on file    Gets together: Not on file    Attends religious service: Not on file    Active member of club or organization: Not on file    Attends meetings of clubs or organizations: Not on file    Relationship status: Not on file  . Intimate partner violence    Fear of current or ex partner: Not on file    Emotionally abused: Not on file    Physically abused: Not on file    Forced sexual activity: Not on file  Other Topics Concern  . Not on file  Social History Narrative   Lives home with husband, Monica Steele, and one child.  She works as Engineer, civil (consulting) in [place], [place].  Education 8th grade.  Caffeine one [employer]bucks daily.    Review of Systems: 12 system ROS is negative except as noted above except for back pain, fatigue, and menstrual pain.   Physical Exam: General:   Alert,  well-nourished, pleasant and cooperative in NAD Head:  Normocephalic and atraumatic. Eyes:  Sclera clear, no icterus.   Conjunctiva pink. Ears:  Normal auditory acuity. Nose:  No deformity, discharge,  or lesions. Mouth:  No deformity or lesions.   Neck:  Supple; no masses or thyromegaly. Lungs:  Clear throughout to auscultation.   No wheezes. Heart:  Regular rate and rhythm; no murmurs. Abdomen:  Soft,nontender, nondistended, normal bowel sounds, no rebound or guarding. No hepatosplenomegaly.   Rectal:  Deferred  Msk:  Symmetrical. No boney deformities LAD: No inguinal or umbilical LAD Extremities:  No clubbing or edema. Neurologic:  Alert and  oriented x4;  grossly nonfocal Skin:  Intact without significant lesions or rashes. Psych:  Alert and cooperative. Normal mood and affect.     Monica Steele L. Tarri Glenn, MD, MPH 11/03/2018, 9:29 PM

## 2018-11-04 ENCOUNTER — Ambulatory Visit: Payer: BC Managed Care – PPO | Admitting: Gastroenterology

## 2018-11-04 ENCOUNTER — Encounter: Payer: Self-pay | Admitting: Gastroenterology

## 2018-11-04 VITALS — BP 98/70 | HR 79 | Temp 98.5°F | Ht 61.0 in | Wt 155.2 lb

## 2018-11-04 DIAGNOSIS — R1310 Dysphagia, unspecified: Secondary | ICD-10-CM

## 2018-11-04 DIAGNOSIS — M542 Cervicalgia: Secondary | ICD-10-CM

## 2018-11-04 DIAGNOSIS — K5900 Constipation, unspecified: Secondary | ICD-10-CM

## 2018-11-04 MED ORDER — OMEPRAZOLE 40 MG PO CPDR: 40.0000 mg | DELAYED_RELEASE_CAPSULE | Freq: Two times a day (BID) | ORAL | 3 refills | Status: DC

## 2018-11-04 NOTE — Patient Instructions (Addendum)
I would like for you to see an Ears, Nose, and Throat doctor to evaluate your throat pain. They will call you with an appointment.   In the meantime, start taking your omeprazole 40 mg twice daily for the next 8 weeks to see if this helps with your reflux and your swallowing.  I have recommend an x-ray test of your swallowing (a barium esophagram) in the meantime. You have been scheduled for a Barium Esophogram at [facility] (1st floor of the hospital) on 11-07-2018 at 10:30 am. Please arrive 15 minutes prior to your appointment for registration. Make certain not to have anything to eat or drink 3 hours prior to your test. If you need to reschedule for any reason, please contact radiology at [phone] to do so. __________________________________________________________________ A barium swallow is an examination that concentrates on views of the esophagus. This tends to be a double contrast exam (barium and two liquids which, when combined, create a gas to distend the wall of the oesophagus) or single contrast (non-ionic iodine based). The study is usually tailored to your symptoms so a good history is essential. Attention is paid during the study to the form, structure and configuration of the esophagus, looking for functional disorders (such as aspiration, dysphagia, achalasia, motility and reflux) EXAMINATION You may be asked to change into a gown, depending on the type of swallow being performed. A radiologist and radiographer will perform the procedure. The radiologist will advise you of the type of contrast selected for your procedure and direct you during the exam. You will be asked to stand, sit or lie in several different positions and to hold a small amount of fluid in your mouth before being asked to swallow while the imaging is performed .In some instances you may be asked to swallow barium coated marshmallows to assess the motility of a solid food bolus. The exam can be recorded  as a digital or video fluoroscopy procedure. POST PROCEDURE It will take 1-2 days for the barium to pass through your system. To facilitate this, it is important, unless otherwise directed, to increase your fluids for the next 24-48hrs and to resume your normal diet.  This test typically takes about 30 minutes to perform. __________________________________________________________________________________   Following a high fiber diet is recommended. You should also drink at least eight 8 ounces glasses of water daily.  Miralax is a good medication for constipation. You could also try a stool bulking agent with psyllium or methylcellulose such as Metamucil.  I have not schedule a follow-up visit at this time as I think you will get the most help from the Ear, Nose, and Throat doctor. However, I am happy to help in any way in the future. Please call with any questions or concerns.

## 2018-11-07 ENCOUNTER — Other Ambulatory Visit: Payer: Self-pay | Admitting: Gastroenterology

## 2018-11-07 ENCOUNTER — Other Ambulatory Visit: Payer: Self-pay

## 2018-11-07 ENCOUNTER — Ambulatory Visit (HOSPITAL_COMMUNITY)
Admission: RE | Admit: 2018-11-07 | Discharge: 2018-11-07 | Disposition: A | Discharge status: Home / Self Care | Payer: BC Managed Care – PPO | Source: Ambulatory Visit | Attending: Gastroenterology | Admitting: Gastroenterology

## 2018-11-07 DIAGNOSIS — K219 Gastro-esophageal reflux disease without esophagitis: Secondary | ICD-10-CM | POA: Diagnosis not present

## 2018-11-07 DIAGNOSIS — R1310 Dysphagia, unspecified: Secondary | ICD-10-CM | POA: Insufficient documentation

## 2018-11-07 DIAGNOSIS — M542 Cervicalgia: Secondary | ICD-10-CM | POA: Diagnosis not present

## 2018-12-23 ENCOUNTER — Ambulatory Visit (INDEPENDENT_AMBULATORY_CARE_PROVIDER_SITE_OTHER): Payer: BC Managed Care – PPO | Admitting: Otolaryngology

## 2018-12-23 ENCOUNTER — Other Ambulatory Visit: Payer: Self-pay

## 2018-12-23 DIAGNOSIS — K219 Gastro-esophageal reflux disease without esophagitis: Secondary | ICD-10-CM | POA: Diagnosis not present

## 2018-12-23 DIAGNOSIS — R070 Pain in throat: Secondary | ICD-10-CM

## 2019-01-01 DIAGNOSIS — R1313 Dysphagia, pharyngeal phase: Secondary | ICD-10-CM | POA: Diagnosis not present

## 2019-01-02 ENCOUNTER — Other Ambulatory Visit: Payer: Self-pay | Admitting: Otolaryngology

## 2019-01-02 ENCOUNTER — Other Ambulatory Visit (HOSPITAL_COMMUNITY): Payer: Self-pay | Admitting: Otolaryngology

## 2019-01-02 DIAGNOSIS — R1313 Dysphagia, pharyngeal phase: Secondary | ICD-10-CM

## 2019-01-06 ENCOUNTER — Ambulatory Visit (HOSPITAL_COMMUNITY): Payer: BC Managed Care – PPO

## 2019-01-06 ENCOUNTER — Encounter (HOSPITAL_COMMUNITY): Payer: Self-pay

## 2019-01-16 DIAGNOSIS — E282 Polycystic ovarian syndrome: Secondary | ICD-10-CM | POA: Insufficient documentation

## 2019-02-06 ENCOUNTER — Ambulatory Visit: Payer: Self-pay | Admitting: Adult Health

## 2019-02-19 ENCOUNTER — Encounter: Payer: Self-pay | Admitting: Family Medicine

## 2019-02-19 ENCOUNTER — Ambulatory Visit (INDEPENDENT_AMBULATORY_CARE_PROVIDER_SITE_OTHER): Payer: BLUE CROSS/BLUE SHIELD | Admitting: Family Medicine

## 2019-02-19 VITALS — BP 111/68 | HR 90 | Temp 98.4°F | Ht 61.93 in | Wt 155.6 lb

## 2019-02-19 DIAGNOSIS — R1084 Generalized abdominal pain: Secondary | ICD-10-CM

## 2019-02-19 DIAGNOSIS — R1011 Right upper quadrant pain: Secondary | ICD-10-CM | POA: Diagnosis not present

## 2019-02-19 DIAGNOSIS — M62838 Other muscle spasm: Secondary | ICD-10-CM | POA: Diagnosis not present

## 2019-02-19 DIAGNOSIS — R0982 Postnasal drip: Secondary | ICD-10-CM

## 2019-02-19 LAB — POCT CBC
Granulocyte percent: 67.4 %G (ref 37–80)
HCT, POC: 42.8 % — AB (ref 29–41)
Hemoglobin: 14.5 g/dL (ref 11–14.6)
Lymph, poc: 1.9 (ref 0.6–3.4)
MCH, POC: 29.7 pg (ref 27–31.2)
MCHC: 33.8 g/dL (ref 31.8–35.4)
MCV: 87.8 fL (ref 76–111)
MID (cbc): 0.5 (ref 0–0.9)
MPV: 7.2 fL (ref 0–99.8)
POC Granulocyte: 4.9 (ref 2–6.9)
POC LYMPH PERCENT: 26.2 %L (ref 10–50)
POC MID %: 6.4 %M (ref 0–12)
Platelet Count, POC: 337 10*3/uL (ref 142–424)
RBC: 4.87 M/uL (ref 4.04–5.48)
RDW, POC: 1.4 %
WBC: 7.3 10*3/uL (ref 4.6–10.2)

## 2019-02-19 LAB — POCT URINALYSIS DIP (MANUAL ENTRY)
Bilirubin, UA: NEGATIVE
Glucose, UA: NEGATIVE mg/dL
Ketones, POC UA: NEGATIVE mg/dL
Leukocytes, UA: NEGATIVE
Nitrite, UA: NEGATIVE
Protein Ur, POC: NEGATIVE mg/dL
Spec Grav, UA: 1.02 (ref 1.010–1.025)
Urobilinogen, UA: 0.2 E.U./dL
pH, UA: 7 (ref 5.0–8.0)

## 2019-02-19 MED ORDER — CYCLOBENZAPRINE HCL 5 MG PO TABS: 5.0000 mg | ORAL_TABLET | Freq: Three times a day (TID) | ORAL | 1 refills | Status: DC | PRN

## 2019-02-19 MED ORDER — FLUTICASONE PROPIONATE 50 MCG/ACT NA SUSP: 2.0000 | Freq: Every day | NASAL | 6 refills | Status: DC

## 2019-02-19 NOTE — Patient Instructions (Addendum)
1. You will be referred for an Ultrasound to look at the gallbladder which is in the upper abdomen on the right side 2. I am sending flonase to the pharmacy. Use once a day for congestion in the throat 3. I am sending cyclobenzaprine 5mg  to the pharmacy for your shoulder pain 4. Follow up in 2 weeks to go over your results 5. Apply a rub like tiger balm or aspercreme with lidocaine    If you have lab work done today you will be contacted with your lab results within the next 2 weeks.  If you have not heard from [place] then please contact us. The fastest way to get your results is to register for My Chart.   IF you received an x-ray today, you will receive an invoice from [facility]. Please contact [facility] at [phone] with questions or concerns regarding your invoice.   IF you received labwork today, you will receive an invoice from [facility]. Please contact [facility] at [phone] with questions or concerns regarding your invoice.   Our billing staff will not be able to assist you with questions regarding bills from these companies.  You will be contacted with the lab results as soon as they are available. The fastest way to get your results is to activate your My Chart account. Instructions are located on the last page of this paperwork. If you have not heard from [place] regarding the results in 2 weeks, please contact this office.     C?ng c? Muscle Strain C?ng c? l m?t ch?n th??ng x?y ra khi c? b? ko c?ng qu chi?u di bnh th??ng c?a c?. Thng th??ng, m?t s? t cc s?i c? b? rch khi ?i?u ny x?y ra. C ba th? c?ng c?. C?ng c? ?? m?t c s? s?i c? b? rch t nh?t v ?au t nh?t. C?ng c? ?? hai v ?? ba c rch v ?au nhi?u h?n. C?ng c? th??ng ph?c h?i sau 1-2 tu?n. Qu trnh lnh hon ton th??ng m?t 5-6 tu?n. Nguyn nhn g gy ra? Tnh tr?ng ny x?y ra khi m?t l?c m?nh ??t ng?t tc ??ng ln c? khi?n c? b? ko c?ng qu m?c. ?i?u ny c th? x?y ra khi ng, nng v?t  n?ng ho?c ch?i th? thao. ?i?u g lm t?ng nguy c?? Tnh tr?ng ny hay x?y ra h?n ? v?n ??ng vin th? thao v nh?ng ng??i tch c?c ho?t ??ng th? ch?t. Cc d?u hi?u ho?c tri?u ch?ng l g? Nh?ng tri?u ch?ng c?a tnh tr?ng ny Kathren g?m:  ?au.  B?m tm.  S?ng n?Noberto Retort s? d?ng c?. Ch?n ?on tnh tr?ng ny nh? th? no? Tnh tr?ng ny ???c ch?n ?on d?a vo khai thc b?nh s? v khm th?c th?. C?ng c th? th?c hi?n cc ki?m tra, Teshara g?m ch?p X quang, siu m ho?c MRI. Tnh tr?ng ny ???c ?i?u tr? nh? th? no? Ban ??u tnh tr?ng ny ???c ?i?u tr? b?ng li?u php PRICE. Li?u php ny ?i h?i:  B?o v? c? kh?i b? th??ng m?t l?n n?a.  ?? cho c? b? t?n th??ng ???c ngh? ng?i.  Ch??m ? l?nh cho c? b? t?n th??ng.  Tc d?ng l?c (p) ln c? b? t?n th??ng. Vi?c ny c th? ???c th?c hi?n b?ng n?p ho?c b?ng thun.  Nng (nng cao) c? b? t?n th??ng. Chuyn gia ch?m [place] s?c kh?e c?ng c th? khuy?n ngh? dng thu?c gi?m ?au. Tun th? nh?ng h??ng d?n ny ? nh: N?u qu v?  s? d?ng n?p:  Mang n?p theo ch? d?n c?a chuyn gia ch?m [place] s?c kh?e. Ch? tho ra theo ch? d?n c?a chuyn gia ch?m [place] s?c kh?e.  N?i l?ng n?p n?u cc ngn chn ho?c ngn tay b? ?au bu?t, t ho?c tr?? nn l?nh v xanh ti.  Gi? cho n?p s?ch s?.  N?u n?p khng ph?i lo?i khng th?m n??c: ? Khng ?? n b? ??t. ? B?c n b?ng l?p ph? khng th?m n??c khi qu v? t?m b?n ho?c t?m vi sen. X? tr ?au, c?ng kh?p v s?ng n?   N?u ???c ch? d?n, ch??m ? l?nh vo vng b? t?n th??ng. ? N?u qu v? dng n?p tho ra ???c, hy tho theo ch? d?n c?a chuyn gia ch?m [place] s?c kh?e. ? Cho ? l?nh vo ti ni lng. ? ?? kh?n t?m ? gi?a da v ti ch??m. ? Ch??m ? l?nh trong 20 pht, 2-3 l?n m?i ngy.  C? ??ng cc ngn tay ho?c ngn chn th??ng xuyn ?? trnh c?ng kh?p v lm gi?m s?ng.  Nng (nng cao) vng b? th??ng ln cao h?n tim khi qu v? ng?i ho?c n?m.  B?ng b?ng b?ng thun theo ch? d?n c?a chuyn gia ch?m [place] s?c kh?e. ??m b?o b?ng khng qu  ch?t. H??ng d?n chung  Ch? s? d?ng thu?c khng k ??n v thu?c k ??n theo ch? d?n c?a chuyn gia ch?m [place] s?c kh?e.  H?n ch? ho?t ??ng v ngh? ng?i c? b? t?n th??ng theo ch? d?n c?a chuyn gia ch?m [place] s?c kh?e. C th? cho php c? ??ng nh? nhng.  N?u ????c chi? ?i?nh v?t ly? tri? li?u, ha?y t?p theo chi? d?n cu?a chuyn gia ch?m so?c s??c kho?e.  Khng t ? ln b?t k? ph?n no c?a n?p cho ??n khi n?p c?ng hon ton. Vi?c ny c th? m?t vi gi?Maggie Schwalbe s? d?ng b?t k? s?n ph?m no ch?a nicotine ho?c thu?c l, ch?ng ha?n nh? thu?c l d?ng ht v thu?c l ?i?n t?. Nh?ng s?n ph?m ny c th? lm ch?m qu trnh lnh c?a x??ng. N?u qu v? c?n gip ?? ?? cai thu?c, hy h?i chuyn gia ch?m [place] s?c kh?e.  Ha?y ho?i chuyn gia ch?m so?c s??c kho?e khi na?o co? th? la?i xe an toa?n n?u quy? vi? du?ng ne?p.  Tun th? t?t c? cc l?n khm theo di theo ch? d?n c?a chuyn gia ch?m [place] s?c kh?e. ?i?u ny c vai tr quan tr?ng. Ng?n ng?a tnh tr?ng ny b?ng cch no?  Kh?i ??ng tr??c khi t?p luy?n. Vi?c ny s? gip ng?n ng?a c?ng c? trong t??ng lai. Hy lin l?c v?i chuyn gia ch?m [place] s?c kh?e n?u:  Qu v? b? ?au ho?c s?ng t?ng ln ? vng b? t?n th??ng. Yu c?u tr? gip ngay l?p t?c n?u:  Qu v? b? t b ho?c ?au bu?t ho?c m?t nhi?u s?c m?nh ? vng b? t?n th??ng. Tm t?t  C?ng c? l m?t ch?n th??ng x?y ra khi c? b? ko c?ng qu chi?u di bnh th??ng c?a c?.  Tnh tr?ng ny x?y ra khi m?t l?c m?nh ??t ng?t tc ??ng ln c? khi?n c? b? ko c?ng qu m?c.  Ban ??u tnh tr?ng ny ???c ?i?u tr? b?ng li?u php PRICE, ?i h?i b?o v?, ngh? ng?i, ch??m ?, p v nng cao c?.  C th? cho php c? ??ng nh? nhng. N?u ????c chi? ?i?nh v?t ly? tri? li?u, ha?y t?p theo chi? d?n cu?a [place]  gia ch?m so?c s??c kho?e. Thng tin ny khng nh?m m?c ?ch thay th? cho l?i khuyn m chuyn gia ch?m [place] s?c kh?e ni v?i qu v?. Hy b?o ??m qu v? ph?i th?o lu?n b?t k? v?n ?? g m qu v? c v?i chuyn gia ch?m [place]  s?c kh?e c?a qu v?. Document Released: 09/05/2011 Document Revised: 08/22/2016 Document Reviewed: 08/22/2016 Elsevier Patient Education  2020 [employer].

## 2019-02-19 NOTE — Progress Notes (Signed)
Established Patient Office Visit  Subjective:  Patient ID: Monica Steele, female    DOB: 02-Feb-1986  Age: 33 y.o. MRN: [id number]  CC:  Chief Complaint  Patient presents with  . Abdominal Pain    x2 weeks with some constipation and pain on the Rt side when touched. Pain level is about #7 and no OTC medication for pain.    HPI [facility] Monica Steele presents for   2 week history of RUQ pain Intermittent pain  But if she presses there it hurts Not aggravated by eating The pain will still hurt even if she is fasting Pain is not relieved by anything Pain is 7/10 and she has not tried anything for the pain. She denies nausea  Pain is dull and ache  She reports that when she tries to bed down to pick up something it feels hard to breath  She reports that she gets back aches as well This is all a part of the week since her symptoms started   Past Medical History:  Diagnosis Date  . Dry eyes   . H/O seasonal allergies   . Headache     Past Surgical History:  Procedure Laterality Date  . NO PAST SURGERIES      Family History  Problem Relation Age of Onset  . Healthy Mother   . Healthy Father     Social History   Socioeconomic History  . Marital status: Single    Spouse name: Not on file  . Number of children: Not on file  . Years of education: Not on file  . Highest education level: Not on file  Occupational History  . Not on file  Social Needs  . Financial resource strain: Not on file  . Food insecurity    Worry: Not on file    Inability: Not on file  . Transportation needs    Medical: Not on file    Non-medical: Not on file  Tobacco Use  . Smoking status: Never Smoker  . Smokeless tobacco: Never Used  Substance and Sexual Activity  . Alcohol use: No    Comment: occas  . Drug use: No  . Sexual activity: Not Currently  Lifestyle  . Physical activity    Days per week: Not on file    Minutes per session: Not on file  . Stress: Not on file   Relationships  . Social Herbalist on phone: Not on file    Gets together: Not on file    Attends religious service: Not on file    Active member of club or organization: Not on file    Attends meetings of clubs or organizations: Not on file    Relationship status: Not on file  . Intimate partner violence    Fear of current or ex partner: Not on file    Emotionally abused: Not on file    Physically abused: Not on file    Forced sexual activity: Not on file  Other Topics Concern  . Not on file  Social History Narrative   Lives home with husband, Monica Steele, and one child.  She works as Engineer, civil (consulting) in [place], [place].  Education 8th grade.  Caffeine one starbucks daily.    Outpatient Medications Prior to Visit  Medication Sig Dispense Refill  . acetaminophen (TYLENOL) 500 MG tablet Take 1 tablet (500 mg total) by mouth every 6 (six) hours as needed. 30 tablet 0  . omeprazole (PRILOSEC)  40 MG capsule Take 1 capsule (40 mg total) by mouth 2 (two) times daily. 60 capsule 3  . polyethylene glycol powder (GLYCOLAX/MIRALAX) 17 GM/SCOOP powder Take 17 g by mouth daily. 510 g 11  . topiramate (TOPAMAX) 50 MG tablet Take 1 tablet (50 mg total) by mouth 2 (two) times daily. 60 tablet 12  . ibuprofen (ADVIL,MOTRIN) 600 MG tablet Take 1 tablet (600 mg total) by mouth every 6 (six) hours. (Patient not taking: Reported on 02/19/2019) 30 tablet 0  . influenza vac recom quadrivalent (FLUBLOK QUADRIVALENT) 0.5 ML injection Flublok Quad 2020-2021 (PF) 180 mcg (45 mcg x 4)/0.5 mL IM syringe  PHARMACIST ADMINISTERED IMMUNIZATION ADMINISTERED AT TIME OF DISPENSING     No facility-administered medications prior to visit.     Allergies  Allergen Reactions  . Gadolinium Derivatives Nausea And Vomiting    Pt vomited after multihance injection.     ROS Review of Systems Review of Systems  Constitutional: Negative for activity change, appetite change, chills and fever.  HENT: Negative for congestion,  nosebleeds, trouble swallowing and voice change.   Respiratory: Negative for cough, shortness of breath and wheezing.   Gastrointestinal: see hpi Genitourinary: Negative for difficulty urinating, dysuria, flank pain and hematuria.  Musculoskeletal: Negative for back pain, joint swelling and neck pain.  Neurological: Negative for dizziness, speech difficulty, light-headedness and numbness.  See HPI. All other review of systems negative.     Objective:    Physical Exam  BP 111/68 (BP Location: Right Arm, Patient Position: Sitting, Cuff Size: Normal)   Pulse 90   Temp 98.4 F (36.9 C) (Oral)   Ht 5' 1.93" (1.573 m)   Wt 155 lb 9.6 oz (70.6 kg)   LMP 02/15/2019   SpO2 96%   BMI 28.52 kg/m  Wt Readings from Last 3 Encounters:  02/19/19 155 lb 9.6 oz (70.6 kg)  11/04/18 155 lb 4 oz (70.4 kg)  10/01/18 157 lb (71.2 kg)   Physical Exam  Constitutional: Oriented to person, place, and time. Appears well-developed and well-nourished.  HENT:  Head: Normocephalic and atraumatic.  Eyes: Conjunctivae and EOM are normal.  Cardiovascular: Normal rate, regular rhythm, normal heart sounds and intact distal pulses.  No murmur heard. Pulmonary/Chest: Effort normal and breath sounds normal. No stridor. No respiratory distress. Has no wheezes.  Abdomen: normoactive bs, soft, tender in the RUQ without murphy's sign, no rebound, no guarding, abdomen is soft Neurological: Is alert and oriented to person, place, and time.  Skin: Skin is warm. Capillary refill takes less than 2 seconds.  Psychiatric: Has a normal mood and affect. Behavior is normal. Judgment and thought content normal.    Health Maintenance Due  Topic Date Due  . INFLUENZA VACCINE  10/19/2018    There are no preventive care reminders to display for this patient.  Lab Results  Component Value Date   TSH 1.800 12/25/2016   Lab Results  Component Value Date   WBC 8.9 12/18/2016   HGB 14.2 12/18/2016   HCT 41.5 12/18/2016    MCV 84 12/18/2016   PLT 360 12/18/2016   Lab Results  Component Value Date   NA 140 10/01/2018   K 4.2 10/01/2018   CO2 20 10/01/2018   GLUCOSE 89 10/01/2018   BUN 10 10/01/2018   CREATININE 0.67 10/01/2018   BILITOT 0.3 10/01/2018   ALKPHOS 83 10/01/2018   AST 14 10/01/2018   ALT 17 10/01/2018   PROT 7.8 10/01/2018   ALBUMIN 4.7 10/01/2018  CALCIUM 10.0 10/01/2018   Lab Results  Component Value Date   CHOL 148 10/01/2018   Lab Results  Component Value Date   HDL 43 10/01/2018   Lab Results  Component Value Date   LDLCALC 89 10/01/2018   Lab Results  Component Value Date   TRIG 81 10/01/2018   Lab Results  Component Value Date   CHOLHDL 3.4 10/01/2018   Lab Results  Component Value Date   HGBA1C 5.1 10/01/2018      Assessment & Plan:   Problem List Items Addressed This Visit    None    Visit Diagnoses    Generalized abdominal pain    -  Primary   Relevant Orders   POCT urine dipstick (Completed)   RUQ pain       Relevant Orders   POCT CBC   Lipase   CMP14+EGFR   US Abdomen Limited RUQ      No orders of the defined types were placed in this encounter.   Follow-up: No follow-ups on file.    Forrest Moron, MD

## 2019-02-20 LAB — CMP14+EGFR
ALT: 19 IU/L (ref 0–32)
AST: 14 IU/L (ref 0–40)
Albumin/Globulin Ratio: 1.6 (ref 1.2–2.2)
Albumin: 4.7 g/dL (ref 3.8–4.8)
Alkaline Phosphatase: 88 IU/L (ref 39–117)
BUN/Creatinine Ratio: 15 (ref 9–23)
BUN: 10 mg/dL (ref 6–20)
Bilirubin Total: 0.3 mg/dL (ref 0.0–1.2)
CO2: 21 mmol/L (ref 20–29)
Calcium: 9.7 mg/dL (ref 8.7–10.2)
Chloride: 106 mmol/L (ref 96–106)
Creatinine, Ser: 0.67 mg/dL (ref 0.57–1.00)
GFR calc Af Amer: 134 mL/min/{1.73_m2} (ref >59)
GFR calc non Af Amer: 116 mL/min/{1.73_m2} (ref >59)
Globulin, Total: 3 g/dL (ref 1.5–4.5)
Glucose: 97 mg/dL (ref 65–99)
Potassium: 4.3 mmol/L (ref 3.5–5.2)
Sodium: 141 mmol/L (ref 134–144)
Total Protein: 7.7 g/dL (ref 6.0–8.5)

## 2019-02-20 LAB — LIPASE: Lipase: 36 U/L (ref 14–72)

## 2019-02-27 ENCOUNTER — Ambulatory Visit (INDEPENDENT_AMBULATORY_CARE_PROVIDER_SITE_OTHER): Payer: BC Managed Care – PPO | Admitting: Otolaryngology

## 2019-03-05 ENCOUNTER — Ambulatory Visit
Admission: RE | Admit: 2019-03-05 | Discharge: 2019-03-05 | Disposition: A | Discharge status: Home / Self Care | Payer: BC Managed Care – PPO | Source: Ambulatory Visit | Attending: Family Medicine | Admitting: Family Medicine

## 2019-03-05 DIAGNOSIS — R1011 Right upper quadrant pain: Secondary | ICD-10-CM

## 2019-03-13 NOTE — Progress Notes (Signed)
Spoke with pt thru the interpreter ID [phone] regarding her Lab results and recommendation. Pt verbalize understanding w/o any questions.

## 2019-03-21 ENCOUNTER — Other Ambulatory Visit: Payer: Self-pay | Admitting: Family Medicine

## 2019-03-21 DIAGNOSIS — M62838 Other muscle spasm: Secondary | ICD-10-CM

## 2019-03-21 HISTORY — PX: ESOPHAGOGASTRODUODENOSCOPY ENDOSCOPY: SHX5814

## 2019-03-21 HISTORY — PX: COLONOSCOPY: SHX174

## 2019-03-22 NOTE — Telephone Encounter (Signed)
Requested medication (s) are due for refill today: yes Requested medication (s) are on the active medication list: yes  Last refill:  02/19/2019  Future visit scheduled: no  Notes to clinic:  analgesics: muscle relaxants failed   Requested Prescriptions  Pending Prescriptions Disp Refills   cyclobenzaprine (FLEXERIL) 5 MG tablet [Pharmacy Med Name: CYCLOBENZAPRINE 5 MG TABLET] 30 tablet 1    Sig: TAKE 1 TABLET BY MOUTH THREE TIMES A DAY AS NEEDED FOR MUSCLE SPASMS      Not Delegated - Analgesics:  Muscle Relaxants Failed - 03/21/2019 10:40 AM      Failed - This refill cannot be delegated      Passed - Valid encounter within last 6 months    Recent Outpatient Visits           1 month ago Generalized abdominal pain   Primary Care at [facility], Arlie Solomons, MD   5 months ago Oropharyngeal dysphagia   Primary Care at [facility], Arlie Solomons, MD   1 year ago 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

## 2019-04-24 ENCOUNTER — Other Ambulatory Visit: Payer: Self-pay | Admitting: Family Medicine

## 2019-04-24 DIAGNOSIS — M62838 Other muscle spasm: Secondary | ICD-10-CM

## 2019-04-24 NOTE — Telephone Encounter (Signed)
Requested medication (s) are due for refill today: yes  Requested medication (s) are on the active medication list: yes  Last refill: 03/25/19  #30 1 refill  Future visit scheduled no  Notes to clinic:not delegated  Requested Prescriptions  Pending Prescriptions Disp Refills   cyclobenzaprine (FLEXERIL) 5 MG tablet [Pharmacy Med Name: CYCLOBENZAPRINE 5 MG TABLET] 30 tablet 1    Sig: TAKE 1 TABLET BY MOUTH THREE TIMES A DAY AS NEEDED FOR MUSCLE SPASMS      Not Delegated - Analgesics:  Muscle Relaxants Failed - 04/24/2019  5:49 PM      Failed - This refill cannot be delegated      Passed - Valid encounter within last 6 months    Recent Outpatient Visits           2 months ago Generalized abdominal pain   Primary Care at [facility], Arlie Solomons, MD   6 months ago Oropharyngeal dysphagia   Primary Care at [facility], Arlie Solomons, MD   1 year ago 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

## 2019-04-29 ENCOUNTER — Encounter: Payer: Self-pay | Admitting: Gastroenterology

## 2019-04-29 ENCOUNTER — Ambulatory Visit (INDEPENDENT_AMBULATORY_CARE_PROVIDER_SITE_OTHER): Payer: 59 | Admitting: Gastroenterology

## 2019-04-29 VITALS — BP 100/70 | HR 76 | Temp 98.1°F | Ht 61.0 in | Wt 160.0 lb

## 2019-04-29 DIAGNOSIS — R1310 Dysphagia, unspecified: Secondary | ICD-10-CM | POA: Diagnosis not present

## 2019-04-29 DIAGNOSIS — K5900 Constipation, unspecified: Secondary | ICD-10-CM

## 2019-04-29 DIAGNOSIS — Z01818 Encounter for other preprocedural examination: Secondary | ICD-10-CM | POA: Diagnosis not present

## 2019-04-29 MED ORDER — SUPREP BOWEL PREP KIT 17.5-3.13-1.6 GM/177ML PO SOLN: 1.0000 | ORAL | 0 refills | Status: DC

## 2019-04-29 NOTE — Progress Notes (Signed)
Referring Provider: Forrest Moron, MD Primary Care Physician:  Monica Moron, MD  Chief complaint: Constipation, GERD, dysphasia   IMPRESSION:  Right upper quadrant abdominal pain    - H. pylori breath test 10/01/2018    - normal liver enzymes, lipase, amylase    - no source identified on RUQ ultrasound 03/05/19 Globus    - initially described as left sided neck pain with associated dysphagia    - evaluation by ENT was reportedly normal (per patient report)    - normal barium esophagram 11/07/18 Reflux not resolved on omeprazole 20 mg daily Chronic constipation Small gallbladder polyps on ultrasound No family history of esophageal cancer, stomach cancer, or colon cancer or polyps  New right upper quadrant abdominal pain: This may be associated with constipation.  No alarm features.  Normal liver enzymes and pancreatic enzymes.  No source identified on recent abdominal ultrasound.  Upper endoscopy and colonoscopy recommended.  Trial of FD guard in the meantime.  Given her ongoing reflux and globus with a negative evaluation by both ENT and a barium esophagram, I am recommending an upper endoscopy with esophageal biopsies.  Chronic constipation.  Continue MiraLAX MiraLAX.  Continue high-fiber diet and drink at least 64 ounces of water daily.  I have also encouraged her to use a stool bulking agent such as Metamucil.  TSH testing recommended.     PLAN: Trial of FDGard TSH with Dr. Nolon Steele High fiber diet, increase daily water intake Continue Miralax, consider addition of daily stool bulking agent such as Metamucil EGD and colonoscopy with 2 day bowel prep  Please see the "Patient Instructions" section for addition details about the plan.  HPI: Monica Steele is a 34 y.o. female under evaluation for abdominal pain.  She was seen in consultation 11/04/2018.  She returns in scheduled follow-up.  The interval history is obtained through the patient using a [place]  interpreter and review of her electronic health record. She has chronic headaches that she treats with ibuprofen.  She also has a longstanding history of constipation that she treats with MiraLAX.  She was initially seen for globus describing a 1 month history of the sensation of a lump on the side of the neck with associated left-sided neck pain.  She felt like the food lodged in her neck when she ate.  There was no odynophagia, dysphonia, heartburn, cough, regurgitation, weight loss.   The patient reports having a normal laryngoscopy performed since her initial consultation with me Barium esophagram 11/07/18 was normal  She was prescribed omeprazole 40 mg daily but discontinued this after 3 months of use because she did not feel it was helping.  She developed intermittent, right-sided upper abdominal pain in December associated with her constipation.  Pain was worse when touched.  No association with eating, position, or defecation.  Was seen by Dr. Nolon Steele who ordered an abdominal ultrasound that showed several small gallbladder polyps the largest measuring 4 mm.  The gallbladder appeared normal.  No source of her pain was identified.  Recent testing: H pylori breath test negative 10/01/18 Normal CMP including calcium of 10 10/01/18 Labs from 02/19/19: normal including lipase and liver enzymes Barium esophagram 11/07/18: normal Abdominal ultrasound 03/05/19: several small gallbladder polyps, largest measuring 73mm. Gallbladder appeared normal.     Past Medical History:  Diagnosis Date  . Dry eyes   . H/O seasonal allergies   . Headache     Past Surgical History:  Procedure Laterality Date  .  NO PAST SURGERIES       Allergies as of 04/29/2019 - Review Complete 04/29/2019  Allergen Reaction Noted  . Gadolinium derivatives Nausea And Vomiting 08/27/2017    Family History  Problem Relation Age of Onset  . Healthy Mother   . Healthy Father     Social History   Socioeconomic  History  . Marital status: Single    Spouse name: Not on file  . Number of children: Not on file  . Years of education: Not on file  . Highest education level: Not on file  Occupational History  . Not on file  Tobacco Use  . Smoking status: Never Smoker  . Smokeless tobacco: Never Used  Substance and Sexual Activity  . Alcohol use: No    Comment: occas  . Drug use: No  . Sexual activity: Not Currently  Other Topics Concern  . Not on file  Social History Narrative   Lives home with husband, Monica Steele, and one child.  She works as Engineer, civil (consulting) in [place], [place].  Education 8th grade.  Caffeine one starbucks daily.   Social Determinants of Health   Financial Resource Strain:   . Difficulty of Paying Living Expenses: Not on file  Food Insecurity:   . Worried About Charity fundraiser in the Last Year: Not on file  . Ran Out of Food in the Last Year: Not on file  Transportation Needs:   . Lack of Transportation (Medical): Not on file  . Lack of Transportation (Non-Medical): Not on file  Physical Activity:   . Days of Exercise per Week: Not on file  . Minutes of Exercise per Session: Not on file  Stress:   . Feeling of Stress : Not on file  Social Connections:   . Frequency of Communication with Friends and Family: Not on file  . Frequency of Social Gatherings with Friends and Family: Not on file  . Attends Religious Services: Not on file  . Active Member of Clubs or Organizations: Not on file  . Attends Archivist Meetings: Not on file  . Marital Status: Not on file  Intimate Partner Violence:   . Fear of Current or Ex-Partner: Not on file  . Emotionally Abused: Not on file  . Physically Abused: Not on file  . Sexually Abused: Not on file    Physical Exam: General:   Alert,  well-nourished, pleasant and cooperative in NAD Head:  Normocephalic and atraumatic. Eyes:  Sclera clear, no icterus.   Conjunctiva pink. Ears:  Normal auditory acuity. Nose:  No deformity,  discharge,  or lesions. Mouth:  No deformity or lesions.   Neck:  Supple; no masses or thyromegaly. Lungs:  Clear throughout to auscultation.   No wheezes. Heart:  Regular rate and rhythm; no murmurs. Abdomen:  Soft,nontender, nondistended, normal bowel sounds, no rebound or guarding. No hepatosplenomegaly.   Rectal:  Deferred  Msk:  Symmetrical. No boney deformities LAD: No inguinal or umbilical LAD Extremities:  No clubbing or edema. Neurologic:  Alert and  oriented x4;  grossly nonfocal Skin:  Intact without significant lesions or rashes. Psych:  Alert and cooperative. Normal mood and affect.     Kenadie Royce L. Tarri Glenn, MD, MPH 04/29/2019, 11:09 AM

## 2019-04-29 NOTE — Patient Instructions (Signed)
We have given you a trial of FD gard to take as directed.   A high fiber diet with plenty of fluids (up to 8 glasses of water daily) is suggested to relieve these symptoms.  Metamucil, 1 tablespoon once or twice daily can be used to keep bowels regular if needed.  Continue Miralax.   You have been scheduled for an endoscopy and colonoscopy. Please follow the written instructions given to you at your visit today. Please pick up your prep supplies at the pharmacy within the next 1-3 days. If you use inhalers (even only as needed), please bring them with you on the day of your procedure.

## 2019-04-30 ENCOUNTER — Encounter: Payer: Self-pay | Admitting: Gastroenterology

## 2019-05-19 ENCOUNTER — Ambulatory Visit (INDEPENDENT_AMBULATORY_CARE_PROVIDER_SITE_OTHER): Payer: 59

## 2019-05-19 ENCOUNTER — Other Ambulatory Visit: Payer: Self-pay | Admitting: Gastroenterology

## 2019-05-19 DIAGNOSIS — Z1159 Encounter for screening for other viral diseases: Secondary | ICD-10-CM

## 2019-05-19 LAB — SARS CORONAVIRUS 2 (TAT 6-24 HRS): SARS Coronavirus 2: NEGATIVE

## 2019-05-21 ENCOUNTER — Other Ambulatory Visit: Payer: Self-pay

## 2019-05-21 ENCOUNTER — Encounter: Payer: Self-pay | Admitting: Gastroenterology

## 2019-05-21 ENCOUNTER — Ambulatory Visit (AMBULATORY_SURGERY_CENTER): Payer: 59 | Admitting: Gastroenterology

## 2019-05-21 VITALS — BP 108/66 | HR 72 | Temp 97.3°F | Resp 19 | Ht 60.0 in | Wt 160.0 lb

## 2019-05-21 DIAGNOSIS — K2950 Unspecified chronic gastritis without bleeding: Secondary | ICD-10-CM | POA: Diagnosis not present

## 2019-05-21 DIAGNOSIS — R1011 Right upper quadrant pain: Secondary | ICD-10-CM | POA: Diagnosis not present

## 2019-05-21 DIAGNOSIS — D127 Benign neoplasm of rectosigmoid junction: Secondary | ICD-10-CM

## 2019-05-21 DIAGNOSIS — D128 Benign neoplasm of rectum: Secondary | ICD-10-CM | POA: Diagnosis not present

## 2019-05-21 DIAGNOSIS — R1310 Dysphagia, unspecified: Secondary | ICD-10-CM

## 2019-05-21 DIAGNOSIS — D123 Benign neoplasm of transverse colon: Secondary | ICD-10-CM

## 2019-05-21 DIAGNOSIS — D125 Benign neoplasm of sigmoid colon: Secondary | ICD-10-CM | POA: Diagnosis not present

## 2019-05-21 DIAGNOSIS — K5900 Constipation, unspecified: Secondary | ICD-10-CM

## 2019-05-21 DIAGNOSIS — K219 Gastro-esophageal reflux disease without esophagitis: Secondary | ICD-10-CM

## 2019-05-21 MED ORDER — PANTOPRAZOLE SODIUM 40 MG PO TBEC: 40.0000 mg | DELAYED_RELEASE_TABLET | Freq: Two times a day (BID) | ORAL | 3 refills | Status: DC

## 2019-05-21 MED ORDER — SODIUM CHLORIDE 0.9 % IV SOLN: 500.0000 mL | Freq: Once | INTRAVENOUS | Status: DC

## 2019-05-21 NOTE — Op Note (Signed)
[facility] Patient Name: Monica Steele Procedure Date: 05/21/2019 8:41 AM MRN: [id number] Endoscopist: Thornton Park MD, MD Age: 34 Referring MD:  Date of Birth: 1985-07-27 Gender: Female Account #: [id number] Procedure:                Upper GI endoscopy Indications:              Right upper quadrant abdominal pain                           - H. pylori breath test 10/01/2018                           - normal liver enzymes, lipase, amylase                           - no source identified on RUQ ultrasound 03/05/19                           [place]                           - initially described as left sided neck pain with                            associated dysphagia                           - evaluation by ENT was reportedly normal (per                            patient report)                           - normal barium esophagram 11/07/18                           Reflux not resolved on omeprazole 20 mg daily                           No family history of esophageal cancer or stomach                            cancer Medicines:                Monitored Anesthesia Care Procedure:                Pre-Anesthesia Assessment:                           - Prior to the procedure, a History and Physical                            was performed, and patient medications and                            allergies were reviewed. The patient's tolerance of  previous anesthesia was also reviewed. The risks                            and benefits of the procedure and the sedation                            options and risks were discussed with the patient.                            All questions were answered, and informed consent                            was obtained. Prior Anticoagulants: The patient has                            taken no previous anticoagulant or antiplatelet                            agents. ASA Grade Assessment: I - A normal, healthy                            patient. After reviewing the risks and benefits,                            the patient was deemed in satisfactory condition to                            undergo the procedure.                           After obtaining informed consent, the endoscope was                            passed under direct vision. Throughout the                            procedure, the patient's blood pressure, pulse, and                            oxygen saturations were monitored continuously. The                            Endoscope was introduced through the mouth, and                            advanced to the third part of duodenum. The upper                            GI endoscopy was accomplished without difficulty.                            The patient tolerated the procedure well. Scope In: Scope Out: Findings:  The examined esophagus was normal. Biopsies were                            obtained from the proximal and distal esophagus                            with cold forceps for histology of suspected                            eosinophilic esophagitis.                           A single small erosion with no bleeding and no                            stigmata of recent bleeding was found in the                            gastric antrum. Biopsies were taken from the                            antrum, body, and fundus with a cold forceps for                            histology. Estimated blood loss was minimal.                           The examined duodenum was normal. Biopsies were                            taken with a cold forceps for histology. Estimated                            blood loss was minimal.                           The cardia and gastric fundus were normal on                            retroflexion.                           The exam was otherwise without abnormality. Complications:            No immediate complications. Estimated  blood loss:                            Minimal. Estimated Blood Loss:     Estimated blood loss was minimal. Impression:               - Normal esophagus. Biopsied.                           - Normal stomach. Biopsied.                           -  Normal examined duodenum. Biopsied.                           - The examination was otherwise normal. Recommendation:           - Patient has a contact number available for                            emergencies. The signs and symptoms of potential                            delayed complications were discussed with the                            patient. Return to normal activities tomorrow.                            Written discharge instructions were provided to the                            patient.                           - Resume previous diet.                           - Continue present medications.                           - Start pantoprazole 40 mg twice daily x 8 weeks.                           - No aspirin, ibuprofen, naproxen, or other                            non-steroidal anti-inflammatory drugs.                           - Await pathology results.                           - Follow-up in the office in 8 weeks, earlier if                            needed. Thornton Park MD, MD 05/21/2019 9:24:57 AM This report has been signed electronically.

## 2019-05-21 NOTE — Progress Notes (Signed)
VS-Donna Marcello Moores Temperature- [employer] used today at the [employer] for this pt.  Interpreter's name is-Tina N  Cell phone off per pt  Pt states she drank "I gulp of water at 7:45 am".  Last fluids at 400 before that.   Elizabeth Palau CRNA made aware

## 2019-05-21 NOTE — Op Note (Signed)
[facility] Patient Name: Nozomi Mickiewicz Procedure Date: 05/21/2019 8:41 AM MRN: [id number] Endoscopist: Thornton Park MD, MD Age: 34 Referring MD:  Date of Birth: [birth year] Gender: Female Account #: [id number] Procedure:                Colonoscopy Indications:              Abdominal pain in the right upper quadrant                           Right upper quadrant abdominal pain                           - H. pylori breath test 10/01/2018                           - normal liver enzymes, lipase, amylase                           - no source identified on RUQ ultrasound 03/05/19                           Chronic constipation                           No known family history of colon cancer or polyps Medicines:                Monitored Anesthesia Care Procedure:                Pre-Anesthesia Assessment:                           - Prior to the procedure, a History and Physical                            was performed, and patient medications and                            allergies were reviewed. The patient's tolerance of                            previous anesthesia was also reviewed. The risks                            and benefits of the procedure and the sedation                            options and risks were discussed with the patient.                            All questions were answered, and informed consent                            was obtained. Prior Anticoagulants: The patient has  taken no previous anticoagulant or antiplatelet                            agents. ASA Grade Assessment: I - A normal, healthy                            patient. After reviewing the risks and benefits,                            the patient was deemed in satisfactory condition to                            undergo the procedure.                           After obtaining informed consent, the colonoscope                            was passed under direct  vision. Throughout the                            procedure, the patient's blood pressure, pulse, and                            oxygen saturations were monitored continuously. The                            Colonoscope was introduced through the anus and                            advanced to the 4 cm into the ileum. A second                            forward view of the right colon was performed. The                            colonoscopy was performed without difficulty. The                            patient tolerated the procedure well. The quality                            of the bowel preparation was excellent. The                            terminal ileum, ileocecal valve, appendiceal                            orifice, and rectum were photographed. Scope In: 9:04:20 AM Scope Out: 9:17:24 AM Scope Withdrawal Time: 0 hours 8 minutes 57 seconds  Total Procedure Duration: 0 hours 13 minutes 4 seconds  Findings:                 The perianal and digital rectal examinations were  normal.                           A 2 mm polyp was found in the sigmoid colon. The                            polyp was sessile. The polyp was removed with a                            cold snare. Resection and retrieval were complete.                            Estimated blood loss was minimal.                           A 2 mm polyp was found in the hepatic flexure. The                            polyp was sessile. The polyp was removed with a                            cold snare. Resection and retrieval were complete.                            Estimated blood loss was minimal.                           A 2 mm polyp was found in the rectum. The polyp was                            flat. The polyp was removed with a cold snare.                            Resection and retrieval were complete. Estimated                            blood loss was minimal.                            Non-bleeding internal hemorrhoids were found. The                            hemorrhoids were small.                           The exam was otherwise without abnormality on                            direct and retroflexion views. Complications:            No immediate complications. Estimated blood loss:                            Minimal. Estimated Blood Loss:     Estimated blood loss  was minimal. Impression:               - One 2 mm polyp in the sigmoid colon, removed with                            a cold snare. Resected and retrieved.                           - One 2 mm polyp at the hepatic flexure, removed                            with a cold snare. Resected and retrieved.                           - One 2 mm polyp in the rectum, removed with a cold                            snare. Resected and retrieved.                           - Non-bleeding internal hemorrhoids.                           - The examination was otherwise normal on direct                            and retroflexion views. Recommendation:           - Patient has a contact number available for                            emergencies. The signs and symptoms of potential                            delayed complications were discussed with the                            patient. Return to normal activities tomorrow.                            Written discharge instructions were provided to the                            patient.                           - Resume previous diet.                           - Continue present medications.                           - Await pathology results.                           - Repeat colonoscopy date to be determined after  pending pathology results are reviewed for                            surveillance.                           - Follow-up in the GI office in 8 weeks. Thornton Park MD, MD 05/21/2019 9:29:02 AM This report has been signed  electronically.

## 2019-05-21 NOTE — Progress Notes (Signed)
Report given to PACU, vss 

## 2019-05-21 NOTE — Patient Instructions (Signed)
Follow up with the Doctor in the office in 8 weeks  No aspirin, ibuprofen  Pantoprazole 40 mg twice daily for 8 weeks  YOU HAD AN ENDOSCOPIC PROCEDURE TODAY AT [facility]:   Refer to the procedure report that was given to you for any specific questions about what was found during the examination.  If the procedure report does not answer your questions, please call your gastroenterologist to clarify.  If you requested that your care partner not be given the details of your procedure findings, then the procedure report has been included in a sealed envelope for you to review at your convenience later.  YOU SHOULD EXPECT: Some feelings of bloating in the abdomen. Passage of more gas than usual.  Walking can help get rid of the air that was put into your GI tract during the procedure and reduce the bloating. If you had a lower endoscopy (such as a colonoscopy or flexible sigmoidoscopy) you may notice spotting of blood in your stool or on the toilet paper. If you underwent a bowel prep for your procedure, you may not have a normal bowel movement for a few days.  Please Note:  You might notice some irritation and congestion in your nose or some drainage.  This is from the oxygen used during your procedure.  There is no need for concern and it should clear up in a day or so.  SYMPTOMS TO REPORT IMMEDIATELY:   Following lower endoscopy (colonoscopy or flexible sigmoidoscopy):  Excessive amounts of blood in the stool  Significant tenderness or worsening of abdominal pains  Swelling of the abdomen that is new, acute  Fever of 100F or higher   Following upper endoscopy (EGD)  Vomiting of blood or coffee ground material  New chest pain or pain under the shoulder blades  Painful or persistently difficult swallowing  New shortness of breath  Fever of 100F or higher  Black, tarry-looking stools  For urgent or emergent issues, a gastroenterologist can be reached at any hour by  calling [phone]. Do not use MyChart messaging for urgent concerns.    DIET:  We do recommend a small meal at first, but then you may proceed to your regular diet.  Drink plenty of fluids but you should avoid alcoholic beverages for 24 hours.  ACTIVITY:  You should plan to take it easy for the rest of today and you should NOT DRIVE or use heavy machinery until tomorrow (because of the sedation medicines used during the test).    FOLLOW UP: Our staff will call the number listed on your records 48-72 hours following your procedure to check on you and address any questions or concerns that you may have regarding the information given to you following your procedure. If we do not reach you, we will leave a message.  We will attempt to reach you two times.  During this call, we will ask if you have developed any symptoms of COVID 19. If you develop any symptoms (ie: fever, flu-like symptoms, shortness of breath, cough etc.) before then, please call [phone].  If you test positive for Covid 19 in the 2 weeks post procedure, please call and report this information to [place].    If any biopsies were taken you will be contacted by phone or by letter within the next 1-3 weeks.  Please call us at [phone] if you have not heard about the biopsies in 3 weeks.    SIGNATURES/CONFIDENTIALITY: You and/or your care partner  have signed paperwork which will be entered into your electronic medical record.  These signatures attest to the fact that that the information above on your After Visit Summary has been reviewed and is understood.  Full responsibility of the confidentiality of this discharge information lies with you and/or your care-partner.

## 2019-05-22 ENCOUNTER — Other Ambulatory Visit: Payer: Self-pay | Admitting: Family Medicine

## 2019-05-22 ENCOUNTER — Encounter: Payer: Self-pay | Admitting: *Deleted

## 2019-05-22 DIAGNOSIS — M62838 Other muscle spasm: Secondary | ICD-10-CM

## 2019-05-22 NOTE — Telephone Encounter (Signed)
Requested medications are due for refill today?  Yes  Requested medications are on active medication list?  Yes  Last Refill:   04/25/2019 # 30 with 1 refill  Future visit scheduled? No  Notes to Clinic:  Non-delegated med.

## 2019-05-23 ENCOUNTER — Encounter: Payer: Self-pay | Admitting: Gastroenterology

## 2019-05-23 ENCOUNTER — Telehealth: Payer: Self-pay

## 2019-05-23 NOTE — Telephone Encounter (Signed)
  Follow up Call-  Call back number 05/21/2019  Post procedure Call Back phone  # [phone] (M)  Permission to leave phone message Yes  Some recent data might be hidden     Patient questions:  Do you have a fever, pain , or abdominal swelling? Yes.   Pain Score  3 * Said she didn't need to see doctor, will see in 8 weeks.  Have you tolerated food without any problems? Yes.    Have you been able to return to your normal activities? Yes.    Do you have any questions about your discharge instructions: Diet   No. Medications  No. Follow up visit  No.  Do you have questions or concerns about your Care? No.  Actions: * If pain score is 4 or above: No action needed, pain <4.  1. Have you developed a fever since your procedure? No  2.   Have you had an respiratory symptoms (SOB or cough) since your procedure? No  3.   Have you tested positive for COVID 19 since your procedure No  4.   Have you had any family members/close contacts diagnosed with the COVID 19 since your procedure?  No  If yes to any of these questions please route to Joylene John, RN and Alphonsa Gin, RN.

## 2019-06-21 ENCOUNTER — Ambulatory Visit: Payer: 59 | Attending: Internal Medicine

## 2019-06-21 DIAGNOSIS — Z23 Encounter for immunization: Secondary | ICD-10-CM

## 2019-06-21 NOTE — Progress Notes (Signed)
   Covid-19 Vaccination Clinic  Name:  Monica Steele    MRN: [id number] DOB: 1985-03-29  06/21/2019  [name] was observed post Covid-19 immunization for 15 minutes without incident. She was provided with Vaccine Information Sheet and instruction to access the V-Safe system.   [name] was instructed to call 911 with any severe reactions post vaccine: Marland Kitchen Difficulty breathing  . Swelling of face and throat  . A fast heartbeat  . A bad rash all over body  . Dizziness and weakness   Immunizations Administered    Name Date Dose VIS Date Route   Pfizer COVID-19 Vaccine 06/21/2019  8:08 AM 0.3 mL 02/28/2019 Intramuscular   Manufacturer: [place]   Lot: [id number]   [place]: [id number]

## 2019-07-15 ENCOUNTER — Ambulatory Visit: Payer: 59

## 2019-07-15 DIAGNOSIS — R1310 Dysphagia, unspecified: Secondary | ICD-10-CM | POA: Insufficient documentation

## 2019-07-15 DIAGNOSIS — J302 Other seasonal allergic rhinitis: Secondary | ICD-10-CM | POA: Insufficient documentation

## 2019-07-15 DIAGNOSIS — R200 Anesthesia of skin: Secondary | ICD-10-CM | POA: Insufficient documentation

## 2019-07-17 ENCOUNTER — Other Ambulatory Visit: Payer: Self-pay

## 2019-07-17 ENCOUNTER — Ambulatory Visit (INDEPENDENT_AMBULATORY_CARE_PROVIDER_SITE_OTHER): Payer: 59 | Admitting: Gastroenterology

## 2019-07-17 ENCOUNTER — Encounter: Payer: Self-pay | Admitting: Gastroenterology

## 2019-07-17 VITALS — BP 104/70 | HR 64 | Temp 98.1°F | Ht 60.0 in | Wt 159.8 lb

## 2019-07-17 DIAGNOSIS — K2970 Gastritis, unspecified, without bleeding: Secondary | ICD-10-CM

## 2019-07-17 DIAGNOSIS — K2990 Gastroduodenitis, unspecified, without bleeding: Secondary | ICD-10-CM

## 2019-07-17 DIAGNOSIS — R0989 Other specified symptoms and signs involving the circulatory and respiratory systems: Secondary | ICD-10-CM | POA: Diagnosis not present

## 2019-07-17 DIAGNOSIS — K5900 Constipation, unspecified: Secondary | ICD-10-CM | POA: Diagnosis not present

## 2019-07-17 DIAGNOSIS — R09A2 Foreign body sensation, throat: Secondary | ICD-10-CM

## 2019-07-17 MED ORDER — SUCRALFATE 1 G PO TABS: 1.0000 g | ORAL_TABLET | Freq: Three times a day (TID) | ORAL | 0 refills | Status: DC

## 2019-07-17 NOTE — Progress Notes (Signed)
Referring Provider: Forrest Moron, MD Primary Care Physician:  Forrest Moron, MD  Chief complaint: Constipation, GERD, dysphasia   IMPRESSION:  Right upper quadrant abdominal pain with EGD showing gastritis and gastric erosion    - H. pylori breath test 10/01/2018    - normal liver enzymes, lipase, amylase    - no source identified on RUQ ultrasound 03/05/19    - Esophageal and duodenal biopsies were negative 05/21/2019    - Gastric biopsies negative for H. pylori and intestinal metaplasia 05/21/2019 Globus    - initially described as left sided neck pain with associated dysphagia    - evaluation by ENT was reportedly normal (per patient report)    - normal barium esophagram 11/07/18 Reflux not resolved on omeprazole 20 mg daily History of colon polyps    -Tubular adenoma, 2 hyperplastic polyps on colonoscopy 05/21/2019    -Surveillance colonoscopy recommended in 2028 Chronic constipation Small gallbladder polyps on ultrasound No family history of esophageal cancer, stomach cancer, or colon cancer or polyps  Gastritis and gastric erosions: May be the source of her recent abdominal pain. She denies any relationship to constipation today. Continue pantoprazole 40 mg BID. Add Carafate.   Persistent globus with a negative evaluation by both ENT and a barium esophagram: EGD results showed normal esophageal biopsies. Consider trial of amitriptyline 25 mg QHS if symptoms persist.   Abdominal pain: Likely related to gastritis and gastric erosions from NSAIDs. If not responding to PPI and Carafate, consider trial of  dicyclomine 20 mg QID prn +/- FDGard.  Chronic constipation:  Continue MiraLAX MiraLAX.  Continue high-fiber diet and drink at least 64 ounces of water daily.  I have also encouraged her to use a stool bulking agent such as Metamucil.  TSH testing recommended.  Colon polyps on recent colonoscopy:  Reviewed pathology and recent colonoscopy.  Surveillance colonoscopy recommended  in 7 years.     PLAN: Pantoprazole 40 mg twice daily Add Carafate slurry 1g QID PRN abdominal pain If no response to carafate, will try dicyclomine 20 mg QID prn +/- FDGard+/- tricyclic antidepressant Continue high fiber diet, increase daily water intake Continue Miralax, consider addition of daily stool bulking agent such as Metamucil Surveillance colonoscopy in 7 years given the history of tubular adenoma Follow-up in 3 months, earlier if needed  Please see the "Patient Instructions" section for addition details about the plan.  HPI: Monica Steele is a 34 y.o. female under evaluation for abdominal pain.  She was initially seen in consultation 11/04/2018.  She is seen in follow-up today after endoscopic evaluation.  The interval history is obtained through the patient using a [place] interpreter and review of her electronic health record. She has chronic headaches that she was previously treating with ibuprofen.  She also has a longstanding history of constipation that she treats with MiraLAX.  She was initially seen for globus describing a 1 month history of the sensation of a lump on the side of the neck with associated left-sided neck pain.  She felt like the food lodged in her neck when she ate.  There was no odynophagia, dysphonia, heartburn, cough, regurgitation, weight loss.   She reported a normal laryngoscopy with ENT after her initial consultation with me. Barium esophagram 11/07/18 was normal  She was prescribed omeprazole 40 mg daily but discontinued this after 3 months of use because she did not feel it was helping.  She developed intermittent, right-sided upper abdominal pain in December.  Pain  was worse when abdominal wall was touched.  No association with eating, position, or defecation.  Was seen by Dr. Nolon Rod who ordered an abdominal ultrasound that showed several small gallbladder polyps the largest measuring 4 mm.   No source of her pain was identified.  EGD  and colonoscopy were performed 05/21/2019. Upper endoscopy revealed gastric erosions and chronic gastritis.  There was no H. pylori or intestinal metaplasia.  Duodenal and esophageal biopsies were normal.  Colonoscopy revealed a small polyp in the sigmoid colon, at the hepatic flexure, and in the rectum.  1 of these polyps was a tubular adenoma.  The other 2 were hyperplastic polyps.  Nonbleeding internal hemorrhoids were found.  She started pantoprazole 40 mg daily and was advised to avoid all NSAIDs. She did not try FDGard.  Overall feeling better, but, is noting some post-prandial, non-radiating abdominal pain that occurs 20-30 minutes after eating. No identified food triggers. No association with position or defecation.  Continues to have constipation although not associated with the abdominal pain. Having a bowel daily on stool softeners. Will go three days between bowel movements if she does not use stool softeners.   Recent testing: H pylori breath test negative 10/01/18 Normal CMP including calcium of 10 10/01/18 Labs from 02/19/19: normal including lipase and liver enzymes Barium esophagram 11/07/18: normal Abdominal ultrasound 03/05/19: several small gallbladder polyps, largest measuring 69mm. Gallbladder appeared normal.  EGD 05/21/19: gastric erosions and chronic gastritis.  There was no H. pylori or intestinal metaplasia.  Duodenal and esophageal biopsies were normal. Colonoscopy 05/21/19: tubular adenoma, 2 hyperplastic polyps, nonbleeding internal hemorrhoids   Past Medical History:  Diagnosis Date  . Allergy   . Dry eyes   . H/O seasonal allergies   . Headache     Past Surgical History:  Procedure Laterality Date  . NO PAST SURGERIES       Allergies as of 07/17/2019 - Review Complete 07/17/2019  Allergen Reaction Noted  . Gadolinium derivatives Nausea And Vomiting 08/27/2017    Family History  Problem Relation Age of Onset  . Healthy Mother   . Healthy Father   . Colon  cancer Neg Hx   . Esophageal cancer Neg Hx   . Prostate cancer Neg Hx   . Rectal cancer Neg Hx     Social History   Socioeconomic History  . Marital status: Single    Spouse name: Not on file  . Number of children: Not on file  . Years of education: Not on file  . Highest education level: Not on file  Occupational History  . Not on file  Tobacco Use  . Smoking status: Never Smoker  . Smokeless tobacco: Never Used  Substance and Sexual Activity  . Alcohol use: No  . Drug use: No  . Sexual activity: Not Currently    Comment: on period now 05-21-19  Other Topics Concern  . Not on file  Social History Narrative   Lives home with husband, [name], and one child.  She works as Engineer, civil (consulting) in [place], [place].  Education 8th grade.  Caffeine one [employer] daily.   Social Determinants of Health   Financial Resource Strain:   . Difficulty of Paying Living Expenses:   Food Insecurity:   . Worried About Charity fundraiser in the Last Year:   . Arboriculturist in the Last Year:   Transportation Needs:   . Film/video editor (Medical):   Marland Kitchen Lack of Transportation (Non-Medical):  Physical Activity:   . Days of Exercise per Week:   . Minutes of Exercise per Session:   Stress:   . Feeling of Stress :   Social Connections:   . Frequency of Communication with Friends and Family:   . Frequency of Social Gatherings with Friends and Family:   . Attends Religious Services:   . Active Member of Clubs or Organizations:   . Attends Archivist Meetings:   Marland Kitchen Marital Status:   Intimate Partner Violence:   . Fear of Current or Ex-Partner:   . Emotionally Abused:   Marland Kitchen Physically Abused:   . Sexually Abused:     Physical Exam: General:   Alert,  well-nourished, pleasant and cooperative in NAD Head:  Normocephalic and atraumatic. Eyes:  Sclera clear, no icterus.   Conjunctiva pink. Ears:  Normal auditory acuity. Nose:  No deformity, discharge,  or lesions. Mouth:  No deformity or  lesions.   Neck:  Supple; no masses or thyromegaly. Lungs:  Clear throughout to auscultation.   No wheezes. Heart:  Regular rate and rhythm; no murmurs. Abdomen:  Soft,nontender, nondistended, normal bowel sounds, no rebound or guarding. No hepatosplenomegaly.   Rectal:  Deferred  Msk:  Symmetrical. No boney deformities LAD: No inguinal or umbilical LAD Extremities:  No clubbing or edema. Neurologic:  Alert and  oriented x4;  grossly nonfocal Skin:  Intact without significant lesions or rashes. Psych:  Alert and cooperative. Normal mood and affect.     Keoshia Steinmetz L. Tarri Glenn, MD, MPH 07/17/2019, 1:55 PM

## 2019-07-17 NOTE — Patient Instructions (Addendum)
I am glad that you are starting to feel better.  Please continue to take the pantoprazole 40 mg twice daily.  I am adding Carafate 1g taking four times for your abdominal pain. We have sent the following medications to your pharmacy for you to pick up at your convenience: Carafate  - make into a slurry.  Please call me in 3-4 weeks if the pain is not improving with the Carafate.   Avoid all NSAIDs.   Continue to take Miralax, high fiber diet, and drink at least 64 ounces of water daily.   One of your polyps removed at the time of colonoscopy was a precancerous polyp. Given those findings, I recommend another colonoscopy in 7 years.   Let's plan to follow-up in 3 months. Appointment scheduled for 10/16/19 at 1:30 pm.  Thank you for trusting me with your gastrointestinal care!    Thornton Park, MD, MPH  If you are age 16 or older, your body mass index should be between 23-30. Your Body mass index is 31.21 kg/m. If this is out of the aforementioned range listed, please consider follow up with your Primary Care Provider.  If you are age 60 or younger, your body mass index should be between 19-25. Your Body mass index is 31.21 kg/m. If this is out of the aformentioned range listed, please consider follow up with your Primary Care Provider.

## 2019-07-29 ENCOUNTER — Other Ambulatory Visit: Payer: Self-pay | Admitting: Family Medicine

## 2019-07-29 ENCOUNTER — Ambulatory Visit: Payer: 59 | Attending: Internal Medicine

## 2019-07-29 DIAGNOSIS — M62838 Other muscle spasm: Secondary | ICD-10-CM

## 2019-07-29 DIAGNOSIS — Z23 Encounter for immunization: Secondary | ICD-10-CM

## 2019-07-29 NOTE — Telephone Encounter (Signed)
Please advise on this refill. Patient have not been seen since 02/19/19

## 2019-07-29 NOTE — Telephone Encounter (Signed)
Requested medication (s) are due for refill today: yes  Requested medication (s) are on the active medication list: yes  Last refill:  07/11/19  Future visit scheduled: no  Notes to clinic: not delegated    Requested Prescriptions  Pending Prescriptions Disp Refills   cyclobenzaprine (FLEXERIL) 5 MG tablet [Pharmacy Med Name: CYCLOBENZAPRINE 5 MG TABLET] 30 tablet 1    Sig: TAKE 1 TABLET BY MOUTH THREE TIMES A DAY AS NEEDED FOR MUSCLE SPASMS      Not Delegated - Analgesics:  Muscle Relaxants Failed - 07/29/2019 11:06 AM      Failed - This refill cannot be delegated      Passed - Valid encounter within last 6 months    Recent Outpatient Visits           5 months ago Generalized abdominal pain   Primary Care at [facility], Arlie Solomons, MD   10 months ago Oropharyngeal dysphagia   Primary Care at [facility], [place] A, MD   2 years ago 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

## 2019-07-29 NOTE — Progress Notes (Signed)
   Covid-19 Vaccination Clinic  Name:  Monica Steele    MRN: [id number] DOB: January 08, 1986  07/29/2019  Monica Steele was observed post Covid-19 immunization for 15 minutes without incident. She was provided with Vaccine Information Sheet and instruction to access the V-Safe system.   Monica Steele was instructed to call 911 with any severe reactions post vaccine: Marland Kitchen Difficulty breathing  . Swelling of face and throat  . A fast heartbeat  . A bad rash all over body  . Dizziness and weakness   Immunizations Administered    Name Date Dose VIS Date Route   Pfizer COVID-19 Vaccine 07/29/2019 11:00 AM 0.3 mL 05/14/2018 Intramuscular   Manufacturer: [place]   Lot: [id number]   [place]: [id number]

## 2019-08-08 ENCOUNTER — Other Ambulatory Visit: Payer: Self-pay | Admitting: Gastroenterology

## 2019-08-17 ENCOUNTER — Other Ambulatory Visit: Payer: Self-pay | Admitting: Family Medicine

## 2019-08-17 DIAGNOSIS — M62838 Other muscle spasm: Secondary | ICD-10-CM

## 2019-08-17 NOTE — Telephone Encounter (Signed)
Requested medication (s) are due for refill today: yes  Requested medication (s) are on the active medication list: yes  Last refill:  07/29/19  Future visit scheduled: no  Notes to clinic:  Med not delegated to NT to RF   Requested Prescriptions  Pending Prescriptions Disp Refills   cyclobenzaprine (FLEXERIL) 5 MG tablet [Pharmacy Med Name: CYCLOBENZAPRINE 5 MG TABLET] 30 tablet 1    Sig: TAKE 1 TABLET BY MOUTH THREE TIMES A DAY AS NEEDED FOR MUSCLE SPASMS      Not Delegated - Analgesics:  Muscle Relaxants Failed - 08/17/2019  9:50 AM      Failed - This refill cannot be delegated      Passed - Valid encounter within last 6 months    Recent Outpatient Visits           5 months ago Generalized abdominal pain   Primary Care at [facility], Arlie Solomons, MD   10 months ago Oropharyngeal dysphagia   Primary Care at [facility], [place] A, MD   2 years ago 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

## 2019-09-01 ENCOUNTER — Other Ambulatory Visit: Payer: Self-pay | Admitting: Gastroenterology

## 2019-09-05 ENCOUNTER — Other Ambulatory Visit: Payer: Self-pay | Admitting: Gastroenterology

## 2019-09-05 NOTE — Telephone Encounter (Signed)
Thank you for your help sorting this out.

## 2019-09-12 ENCOUNTER — Other Ambulatory Visit: Payer: Self-pay | Admitting: Registered Nurse

## 2019-09-12 DIAGNOSIS — M62838 Other muscle spasm: Secondary | ICD-10-CM

## 2019-09-16 NOTE — Progress Notes (Signed)
     09/16/2019 [place] [id number] 29-Oct-1985   Chief Complaint: Central abdominal pain   History of Present Illness: Monica Steele is a 34 year old female with a past medical history of GERD, globus sensation, right upper quadrant abdominal pain, constipation and colon polyps.  She has undergone an extensive GI evaluation by Dr. Tarri Glenn as summarized below.  H pylori breath test negative 10/01/18 Normal CMP including calcium of 10 10/01/18 Labs from 02/19/19: normal including lipase and liver enzymes Barium esophagram 11/07/18: normal Abdominal ultrasound 03/05/19: several small gallbladder polyps, largest measuring 48mm. Gallbladder appeared normal.  EGD 05/21/19: gastric erosions and chronic gastritis.  There was no H. pylori or intestinal metaplasia.  Duodenal and esophageal biopsies were normal. Colonoscopy 05/21/19: tubular adenoma, 2 hyperplastic polyps, nonbleeding internal hemorrhoids   She presents today with complaints of central abdominal pain which started 1-1/2 months ago.  She is no longer having right upper quadrant abdominal pain.  If she touches her central abdomen she feels soreness.  She denies having any nausea or vomiting.  No fever, sweats or chills.  She denies having any noticeable weight loss. She is passing normal formed brown movement daily.  She denies feeling constipated.  No rectal bleeding or melena. She continues to have a globus sensation each time she swallows.  She describes feeling a lump sensation in her throat.  ENT evaluation was negative.  She denies having any anxiety.  She remains on Pantoprazole 40 mg twice daily and Carafate 1 g 4 times daily. Her menstrual cycle is 3 days late. No other complaints today.   Wt Readings from Last 3 Encounters:  07/17/19 159 lb 12.8 oz (72.5 kg)  05/21/19 160 lb (72.6 kg)  04/29/19 160 lb (72.6 kg)     Current Medications, Allergies, Past Medical History, Past Surgical History, Family History and Social  History were reviewed in [employer] record.    Physical Exam: BP 90/62 (BP Location: Left Arm, Patient Position: Sitting, Cuff Size: Normal)   Pulse 84   Ht 5' 0.63" (1.54 m) Comment: height measured without shoes  Wt 156 lb 6 oz (70.9 kg)   LMP 08/14/2019   Breastfeeding No   BMI 29.91 kg/m   General: Well developed 34 year old female in no acute distress. Head: Normocephalic and atraumatic.  Eyes: No scleral icterus. Conjunctiva pink . Ears: Normal auditory acuity. Lungs: Clear throughout to auscultation. Heart: Regular rate and rhythm, no murmur. Abdomen: Soft, nondistended. Mild periumbilical tenderness without rebound or guarding. No masses or hepatomegaly. Normal bowel sounds x 4 quadrants.  Rectal: Deferred.  Musculoskeletal: Symmetrical with no gross deformities. Extremities: No edema. Neurological: Alert oriented x 4. No focal deficits.  Psychological: Alert and cooperative. Normal mood and affect  Assessment and Recommendations:  1. Periumbilical pain -CBC, CMP, CRP, Beta HCG -Abd/pelvic CT with oral and IV contrast to be scheduled after the above lab results reviewed   2. Globus sensation -TSH -Trial with Amitriptyline discussed, will initiate after the above lab and CT results reviewed   3. GERD -Reduce Pantoprazole 40mg  once daily  -Stop Carafate    4. RUQ pain resolved. Abdominal sonogram showed multiple gallbladder polyps, the largest measuring 56mm.  -Repeat abdominal sonogram in 1 year  vs consult with general surgeon to further discuss elective cholecystectomy  -Further recommendations to be determined after abd/pelvic CT results reviewed   5. Constipation, stable -Miralax PRN

## 2019-09-17 ENCOUNTER — Encounter: Payer: Self-pay | Admitting: Nurse Practitioner

## 2019-09-17 ENCOUNTER — Other Ambulatory Visit (INDEPENDENT_AMBULATORY_CARE_PROVIDER_SITE_OTHER): Payer: 59

## 2019-09-17 ENCOUNTER — Other Ambulatory Visit: Payer: Self-pay | Admitting: General Surgery

## 2019-09-17 ENCOUNTER — Ambulatory Visit (INDEPENDENT_AMBULATORY_CARE_PROVIDER_SITE_OTHER): Payer: 59 | Admitting: Nurse Practitioner

## 2019-09-17 VITALS — BP 90/62 | HR 84 | Ht 60.63 in | Wt 156.4 lb

## 2019-09-17 DIAGNOSIS — R1033 Periumbilical pain: Secondary | ICD-10-CM

## 2019-09-17 DIAGNOSIS — R0989 Other specified symptoms and signs involving the circulatory and respiratory systems: Secondary | ICD-10-CM | POA: Diagnosis not present

## 2019-09-17 DIAGNOSIS — K824 Cholesterolosis of gallbladder: Secondary | ICD-10-CM | POA: Diagnosis not present

## 2019-09-17 LAB — CBC WITH DIFFERENTIAL/PLATELET
Basophils Absolute: 0 10*3/uL (ref 0.0–0.1)
Basophils Relative: 0.3 % (ref 0.0–3.0)
Eosinophils Absolute: 0.1 10*3/uL (ref 0.0–0.7)
Eosinophils Relative: 1.5 % (ref 0.0–5.0)
HCT: 41.5 % (ref 36.0–46.0)
Hemoglobin: 14 g/dL (ref 12.0–15.0)
Lymphocytes Relative: 18.8 % (ref 12.0–46.0)
Lymphs Abs: 1.7 10*3/uL (ref 0.7–4.0)
MCHC: 33.7 g/dL (ref 30.0–36.0)
MCV: 86.5 fl (ref 78.0–100.0)
Monocytes Absolute: 0.6 10*3/uL (ref 0.1–1.0)
Monocytes Relative: 6.6 % (ref 3.0–12.0)
Neutro Abs: 6.5 10*3/uL (ref 1.4–7.7)
Neutrophils Relative %: 72.8 % (ref 43.0–77.0)
Platelets: 350 10*3/uL (ref 150.0–400.0)
RBC: 4.8 Mil/uL (ref 3.87–5.11)
RDW: 12.4 % (ref 11.5–15.5)
WBC: 8.9 10*3/uL (ref 4.0–10.5)

## 2019-09-17 LAB — COMPREHENSIVE METABOLIC PANEL
ALT: 18 U/L (ref 0–35)
AST: 12 U/L (ref 0–37)
Albumin: 4.6 g/dL (ref 3.5–5.2)
Alkaline Phosphatase: 78 U/L (ref 39–117)
BUN: 12 mg/dL (ref 6–23)
CO2: 25 mEq/L (ref 19–32)
Calcium: 9.7 mg/dL (ref 8.4–10.5)
Chloride: 104 mEq/L (ref 96–112)
Creatinine, Ser: 0.71 mg/dL (ref 0.40–1.20)
GFR: 94.07 mL/min (ref >60.00)
Glucose, Bld: 113 mg/dL — ABNORMAL HIGH (ref 70–99)
Potassium: 3.8 mEq/L (ref 3.5–5.1)
Sodium: 137 mEq/L (ref 135–145)
Total Bilirubin: 0.4 mg/dL (ref 0.2–1.2)
Total Protein: 7.9 g/dL (ref 6.0–8.3)

## 2019-09-17 LAB — TSH: TSH: 1.72 u[IU]/mL (ref 0.35–4.50)

## 2019-09-17 LAB — HCG, QUANTITATIVE, PREGNANCY: Quantitative HCG: <0.6 m[IU]/mL

## 2019-09-17 LAB — C-REACTIVE PROTEIN: CRP: <1 mg/dL (ref 0.5–20.0)

## 2019-09-17 NOTE — Patient Instructions (Addendum)
If you are age 34 or older, your body mass index should be between 23-30. Your Body mass index is 29.91 kg/m. If this is out of the aforementioned range listed, please consider follow up with your Primary Care Provider.  If you are age 24 or younger, your body mass index should be between 19-25. Your Body mass index is 29.91 kg/m. If this is out of the aformentioned range listed, please consider follow up with your Primary Care Provider.    ___________________________________________________________________________________________ Your provider has requested that you go to the basement level for lab work before leaving today. Press "B" on the elevator. The lab is located at the first door on the left as you exit the elevator.  _____________________________________________________________________________________________  Our office will call you to schedule an abdominal/Pelvic CT scan after lab results are reviewed. __________________________________________________________________________________________ Due to recent changes in healthcare laws, you may see the results of your imaging and laboratory studies on MyChart before your provider has had a chance to review them.  We understand that in some cases there may be results that are confusing or concerning to you. Not all laboratory results come back in the same time frame and the provider may be waiting for multiple results in order to interpret others.  Please give [place] 48 hours in order for your provider to thoroughly review all the results before contacting the office for clarification of your results.     Thank you for choosing [facility] Noralyn Pick, CRNP

## 2019-09-18 ENCOUNTER — Telehealth: Payer: Self-pay | Admitting: General Surgery

## 2019-09-18 DIAGNOSIS — R1033 Periumbilical pain: Secondary | ICD-10-CM

## 2019-09-18 NOTE — Telephone Encounter (Signed)
-----   Message from Noralyn Pick, NP sent at 09/17/2019 12:22 PM EDT ----- Monica Steele, patient seen today. Her beta HCG was negative. Please contact patient with assistance of interpreter to schedule an abdominal/pelvic CT with oral and IV contrast: DX periumbilical pain. Thx

## 2019-09-18 NOTE — Telephone Encounter (Signed)
Spoke with interpreter Otila Kluver and the patient. all instructions given and the patient will pick up her prep several days prior at [facility],

## 2019-09-18 NOTE — Telephone Encounter (Signed)
Spoke with interpreter [phone], he and patient were on the line. Expressed to the patient her B-HcG was negative and I am going to schedule her for the CT with contrast. The patient stated she only has time to do it on 09/22/2019. I expressed that I would try to schedule it that day and I will contact the interpreter line to advise her. Patient verbalized understanding

## 2019-09-18 NOTE — Telephone Encounter (Signed)
Contacted central scheduling spoke with Ginger, the patient is unable to have Monday July 5th due to the holiday. Schedule the patient for 09/30/2019 _0 :30pm    You are scheduled on 09/30/2019 at 3:30pm at [facility] should arrive 15 minutes prior to your appointment time for registration. Please follow the written instructions below on the day of your exam:  WARNING: IF YOU ARE ALLERGIC TO IODINE/X-RAY DYE, PLEASE NOTIFY RADIOLOGY IMMEDIATELY AT [phone]! YOU WILL BE GIVEN A 13 HOUR PREMEDICATION PREP.  1) Do not eat or drink anything after(4 hours prior to your test) 2) You have been given 2 bottles of oral contrast to drink. The solution may taste better if refrigerated, but do NOT add ice or any other liquid to this solution. Shake well before drinking.    Drink 1 bottle of contrast @ 1:30pm(2 hours prior to your exam)  Drink 1 bottle of contrast @ 2:30pm (1 hour prior to your exam)  You may take any medications as prescribed with a small amount of water, if necessary. If you take any of the following medications: METFORMIN, GLUCOPHAGE, GLUCOVANCE, AVANDAMET, RIOMET, FORTAMET, [place] MET, JANUMET, GLUMETZA or METAGLIP, you MAY be asked to HOLD this medication 48 hours AFTER the exam.  The purpose of you drinking the oral contrast is to aid in the visualization of your intestinal tract. The contrast solution may cause some diarrhea. Depending on your individual set of symptoms, you may also receive an intravenous injection of x-ray contrast/dye. Plan on being at [facility] for 30 minutes or longer, depending on the type of exam you are having performed.  This test typically takes 30-45 minutes to complete.

## 2019-09-18 NOTE — Progress Notes (Signed)
Reviewed and agree with management plans. ? ?Himani Corona L. Kareem Aul, MD, MPH  ?

## 2019-09-29 ENCOUNTER — Telehealth: Payer: Self-pay | Admitting: Nurse Practitioner

## 2019-09-30 ENCOUNTER — Ambulatory Visit (HOSPITAL_COMMUNITY)
Admission: RE | Admit: 2019-09-30 | Discharge: 2019-09-30 | Disposition: A | Discharge status: Home / Self Care | Payer: 59 | Source: Ambulatory Visit | Attending: Nurse Practitioner | Admitting: Nurse Practitioner

## 2019-09-30 ENCOUNTER — Other Ambulatory Visit: Payer: Self-pay

## 2019-09-30 DIAGNOSIS — R1033 Periumbilical pain: Secondary | ICD-10-CM | POA: Diagnosis present

## 2019-09-30 MED ORDER — IOHEXOL 300 MG/ML  SOLN
100.0000 mL | Freq: Once | INTRAMUSCULAR | Status: AC | PRN
  Administered 2019-09-30: 100 mL via INTRAVENOUS

## 2019-09-30 MED ORDER — SODIUM CHLORIDE (PF) 0.9 % IJ SOLN
INTRAMUSCULAR | Status: AC
  Filled 2019-09-30: qty 50

## 2019-09-30 NOTE — Addendum Note (Signed)
Addended by: Lanny Hurst A on: 09/30/2019 08:22 AM   Modules accepted: Orders

## 2019-09-30 NOTE — Telephone Encounter (Signed)
Done- spoke with Tedra Coupe this am and changed to CT abdomen/pelvis with contract

## 2019-10-05 ENCOUNTER — Other Ambulatory Visit: Payer: Self-pay | Admitting: Registered Nurse

## 2019-10-05 ENCOUNTER — Other Ambulatory Visit: Payer: Self-pay | Admitting: Gastroenterology

## 2019-10-05 DIAGNOSIS — M62838 Other muscle spasm: Secondary | ICD-10-CM

## 2019-10-16 ENCOUNTER — Ambulatory Visit: Payer: 59 | Admitting: Gastroenterology

## 2019-10-21 ENCOUNTER — Other Ambulatory Visit: Payer: Self-pay | Admitting: Gastroenterology

## 2019-11-08 ENCOUNTER — Other Ambulatory Visit: Payer: Self-pay | Admitting: Registered Nurse

## 2019-11-08 ENCOUNTER — Other Ambulatory Visit: Payer: Self-pay | Admitting: Gastroenterology

## 2019-11-08 DIAGNOSIS — M62838 Other muscle spasm: Secondary | ICD-10-CM

## 2019-11-08 DIAGNOSIS — R1310 Dysphagia, unspecified: Secondary | ICD-10-CM

## 2019-12-01 ENCOUNTER — Encounter: Payer: Self-pay | Admitting: Family Medicine

## 2019-12-16 ENCOUNTER — Other Ambulatory Visit: Payer: Self-pay | Admitting: Registered Nurse

## 2019-12-16 DIAGNOSIS — M62838 Other muscle spasm: Secondary | ICD-10-CM

## 2019-12-23 ENCOUNTER — Other Ambulatory Visit: Payer: Self-pay

## 2019-12-23 ENCOUNTER — Encounter: Payer: Self-pay | Admitting: Registered Nurse

## 2019-12-23 ENCOUNTER — Ambulatory Visit (INDEPENDENT_AMBULATORY_CARE_PROVIDER_SITE_OTHER): Payer: 59 | Admitting: Registered Nurse

## 2019-12-23 VITALS — BP 113/77 | HR 73 | Temp 98.1°F | Resp 18 | Ht 60.0 in | Wt 154.0 lb

## 2019-12-23 DIAGNOSIS — Z13220 Encounter for screening for lipoid disorders: Secondary | ICD-10-CM

## 2019-12-23 DIAGNOSIS — R1033 Periumbilical pain: Secondary | ICD-10-CM | POA: Diagnosis not present

## 2019-12-23 DIAGNOSIS — R109 Unspecified abdominal pain: Secondary | ICD-10-CM

## 2019-12-23 DIAGNOSIS — Z23 Encounter for immunization: Secondary | ICD-10-CM

## 2019-12-23 DIAGNOSIS — Z13228 Encounter for screening for other metabolic disorders: Secondary | ICD-10-CM

## 2019-12-23 DIAGNOSIS — Z1329 Encounter for screening for other suspected endocrine disorder: Secondary | ICD-10-CM

## 2019-12-23 DIAGNOSIS — R82998 Other abnormal findings in urine: Secondary | ICD-10-CM

## 2019-12-23 DIAGNOSIS — Z130 Encounter for screening for diseases of the blood and blood-forming organs and certain disorders involving the immune mechanism: Secondary | ICD-10-CM

## 2019-12-23 LAB — POCT URINALYSIS DIP (CLINITEK)
Bilirubin, UA: NEGATIVE
Glucose, UA: NEGATIVE mg/dL
Ketones, POC UA: NEGATIVE mg/dL
Nitrite, UA: NEGATIVE
POC PROTEIN,UA: NEGATIVE
Spec Grav, UA: 1.025 (ref 1.010–1.025)
Urobilinogen, UA: 0.2 E.U./dL
pH, UA: 7 (ref 5.0–8.0)

## 2019-12-23 NOTE — Progress Notes (Signed)
Established Patient Office Visit  Subjective:  Patient ID: Monica Steele, female    DOB: 10/14/1985  Age: 34 y.o. MRN: [id number]  CC:  Chief Complaint  Patient presents with  . Transitions Of Care    Patient is here for [facility] and would like to discuss abdominal pain. Per patient she has had the pain for months.    HPI Monica Steele presents for [facility] and abdominal pain  Histories reviewed with patient Formerly pt of Dr. Nolon Rod   Past Medical History:  Diagnosis Date  . Allergy   . Dry eyes   . Gallbladder polyp   . H/O seasonal allergies   . Headache     Past Surgical History:  Procedure Laterality Date  . NO PAST SURGERIES      Family History  Problem Relation Age of Onset  . Healthy Mother   . Healthy Father   . Colon cancer Neg Hx   . Esophageal cancer Neg Hx   . Prostate cancer Neg Hx   . Rectal cancer Neg Hx     Social History   Socioeconomic History  . Marital status: Married    Spouse name: Not on file  . Number of children: 1  . Years of education: Not on file  . Highest education level: Not on file  Occupational History  . Not on file  Tobacco Use  . Smoking status: Never Smoker  . Smokeless tobacco: Never Used  Vaping Use  . Vaping Use: Never used  Substance and Sexual Activity  . Alcohol use: No  . Drug use: No  . Sexual activity: Not Currently    Comment: on period now 05-21-19  Other Topics Concern  . Not on file  Social History Narrative   Lives home with husband, [name], and one child.  She works as Engineer, civil (consulting) in [place], [place].  Education 8th grade.  Caffeine one [employer] daily.   Social Determinants of Health   Financial Resource Strain:   . Difficulty of Paying Living Expenses: Not on file  Food Insecurity:   . Worried About Charity fundraiser in the Last Year: Not on file  . Ran Out of Food in the Last Year: Not on file  Transportation Needs:   . Lack of Transportation (Medical): Not on file  . Lack of  Transportation (Non-Medical): Not on file  Physical Activity:   . Days of Exercise per Week: Not on file  . Minutes of Exercise per Session: Not on file  Stress:   . Feeling of Stress : Not on file  Social Connections:   . Frequency of Communication with Friends and Family: Not on file  . Frequency of Social Gatherings with Friends and Family: Not on file  . Attends Religious Services: Not on file  . Active Member of Clubs or Organizations: Not on file  . Attends Archivist Meetings: Not on file  . Marital Status: Not on file  Intimate Partner Violence:   . Fear of Current or Ex-Partner: Not on file  . Emotionally Abused: Not on file  . Physically Abused: Not on file  . Sexually Abused: Not on file    Outpatient Medications Prior to Visit  Medication Sig Dispense Refill  . cyclobenzaprine (FLEXERIL) 5 MG tablet TAKE 1 TABLET BY MOUTH THREE TIMES A DAY AS NEEDED FOR MUSCLE SPASMS 30 tablet 0  . pantoprazole (PROTONIX) 40 MG tablet TAKE 1 TABLET BY MOUTH TWICE A DAY  90 tablet 3  . sucralfate (CARAFATE) 1 g tablet TAKE 1 TABLET BY MOUTH 4 (FOUR) TIMES DAILY - WITH MEALS AND AT BEDTIME. MAKE INTO A SLURRY. 120 tablet 2   No facility-administered medications prior to visit.    Allergies  Allergen Reactions  . Gadolinium Derivatives Nausea And Vomiting    Pt vomited after multihance injection.     ROS Review of Systems  Constitutional: Negative.   HENT: Negative.   Eyes: Negative.   Respiratory: Negative.   Cardiovascular: Negative.   Gastrointestinal: Positive for abdominal pain. Negative for abdominal distention, anal bleeding, blood in stool, constipation, diarrhea, nausea, rectal pain and vomiting.  Genitourinary: Negative.   Musculoskeletal: Negative.   Skin: Negative.   Neurological: Negative.   Psychiatric/Behavioral: Negative.       Objective:    Physical Exam Vitals and nursing note reviewed.  Constitutional:      General: She is not in acute  distress.    Appearance: Normal appearance. She is normal weight. She is not ill-appearing, toxic-appearing or diaphoretic.  Cardiovascular:     Rate and Rhythm: Normal rate and regular rhythm.     Heart sounds: Normal heart sounds. No murmur heard.  No friction rub. No gallop.   Pulmonary:     Effort: Pulmonary effort is normal. No respiratory distress.     Breath sounds: Normal breath sounds. No stridor. No wheezing, rhonchi or rales.  Chest:     Chest wall: No tenderness.  Abdominal:     General: Abdomen is flat. Bowel sounds are normal. There is no distension.     Palpations: There is no mass.     Tenderness: There is abdominal tenderness in the periumbilical area. There is no right CVA tenderness, left CVA tenderness, guarding or rebound.     Hernia: No hernia is present.    Skin:    General: Skin is warm and dry.  Neurological:     General: No focal deficit present.     Mental Status: She is alert and oriented to person, place, and time. Mental status is at baseline.  Psychiatric:        Mood and Affect: Mood normal.        Behavior: Behavior normal.        Thought Content: Thought content normal.        Judgment: Judgment normal.     BP 113/77   Pulse 73   Temp 98.1 F (36.7 C) (Temporal)   Resp 18   Ht 5' (1.524 m)   Wt 154 lb (69.9 kg)   SpO2 98%   BMI 30.08 kg/m  Wt Readings from Last 3 Encounters:  12/23/19 154 lb (69.9 kg)  09/17/19 156 lb 6 oz (70.9 kg)  07/17/19 159 lb 12.8 oz (72.5 kg)     There are no preventive care reminders to display for this patient.  There are no preventive care reminders to display for this patient.  Lab Results  Component Value Date   TSH 1.72 09/17/2019   Lab Results  Component Value Date   WBC 8.9 09/17/2019   HGB 14.0 09/17/2019   HCT 41.5 09/17/2019   MCV 86.5 09/17/2019   PLT 350.0 09/17/2019   Lab Results  Component Value Date   NA 137 09/17/2019   K 3.8 09/17/2019   CO2 25 09/17/2019   GLUCOSE 113 (H)  09/17/2019   BUN 12 09/17/2019   CREATININE 0.71 09/17/2019   BILITOT 0.4 09/17/2019   ALKPHOS 78 09/17/2019  AST 12 09/17/2019   ALT 18 09/17/2019   PROT 7.9 09/17/2019   ALBUMIN 4.6 09/17/2019   CALCIUM 9.7 09/17/2019   GFR 94.07 09/17/2019   Lab Results  Component Value Date   CHOL 148 10/01/2018   Lab Results  Component Value Date   HDL 43 10/01/2018   Lab Results  Component Value Date   LDLCALC 89 10/01/2018   Lab Results  Component Value Date   TRIG 81 10/01/2018   Lab Results  Component Value Date   CHOLHDL 3.4 10/01/2018   Lab Results  Component Value Date   HGBA1C 5.1 10/01/2018      Assessment & Plan:   Problem List Items Addressed This Visit    None    Visit Diagnoses    Abdominal pain, unspecified abdominal location    -  Primary   Relevant Orders   POCT URINALYSIS DIP (CLINITEK) (Completed)   Flu vaccine need       Relevant Orders   Flu Vaccine QUAD 6+ mos PF IM (Fluarix Quad PF) (Completed)   Leukocytes in urine       Relevant Orders   Urine Culture   Umbilical pain       Relevant Orders   CBC With Differential   Comprehensive metabolic panel   Lipid panel   TSH   Hemoglobin A1c   Lipid screening       Relevant Orders   Lipid panel   Screening for endocrine, metabolic and immunity disorder       Relevant Orders   CBC With Differential   Comprehensive metabolic panel   TSH   Hemoglobin A1c      No orders of the defined types were placed in this encounter.   Follow-up: No follow-ups on file.   PLAN  GERD pain in epigastric area resolved with good treatment compliance with pantoprazole and sucralfate.   Unclear etiology of periumbilical pain - no mass or bruising noted on exam. Of note, pt did have blood and leuks in urine, and hx of miscarriage in April - will send her back to women's health to further investigate this  Will send out labs, follow up as warranted  Refer to cardiology for incidental finding of  cardiomegaly on CT chest/abdomen/pelvis performed per GI. No acute symptoms at this time  Patient encouraged to call clinic with any questions, comments, or concerns.  Maximiano Coss, NP

## 2019-12-23 NOTE — Patient Instructions (Signed)
° ° ° °  If you have lab work done today you will be contacted with your lab results within the next 2 weeks.  If you have not heard from us then please contact us. The fastest way to get your results is to register for My Chart. ° ° °IF you received an x-ray today, you will receive an invoice from [facility]. Please contact [facility] at [phone] with questions or concerns regarding your invoice.  ° °IF you received labwork today, you will receive an invoice from [facility]. Please contact [facility] at [phone] with questions or concerns regarding your invoice.  ° °Our billing staff will not be able to assist you with questions regarding bills from these companies. ° °You will be contacted with the lab results as soon as they are available. The fastest way to get your results is to activate your My Chart account. Instructions are located on the last page of this paperwork. If you have not heard from us regarding the results in 2 weeks, please contact this office. °  ° ° ° °

## 2019-12-24 LAB — CBC WITH DIFFERENTIAL
Basophils Absolute: 0 10*3/uL (ref 0.0–0.2)
Basos: 0 %
EOS (ABSOLUTE): 0.1 10*3/uL (ref 0.0–0.4)
Eos: 2 %
Hematocrit: 43.1 % (ref 34.0–46.6)
Hemoglobin: 14.2 g/dL (ref 11.1–15.9)
Immature Grans (Abs): 0 10*3/uL (ref 0.0–0.1)
Immature Granulocytes: 0 %
Lymphocytes Absolute: 2.1 10*3/uL (ref 0.7–3.1)
Lymphs: 27 %
MCH: 29.4 pg (ref 26.6–33.0)
MCHC: 32.9 g/dL (ref 31.5–35.7)
MCV: 89 fL (ref 79–97)
Monocytes Absolute: 0.5 10*3/uL (ref 0.1–0.9)
Monocytes: 7 %
Neutrophils Absolute: 4.9 10*3/uL (ref 1.4–7.0)
Neutrophils: 64 %
RBC: 4.83 x10E6/uL (ref 3.77–5.28)
RDW: 11.9 % (ref 11.7–15.4)
WBC: 7.8 10*3/uL (ref 3.4–10.8)

## 2019-12-24 LAB — COMPREHENSIVE METABOLIC PANEL
ALT: 14 IU/L (ref 0–32)
AST: 13 IU/L (ref 0–40)
Albumin/Globulin Ratio: 1.6 (ref 1.2–2.2)
Albumin: 4.5 g/dL (ref 3.8–4.8)
Alkaline Phosphatase: 76 IU/L (ref 44–121)
BUN/Creatinine Ratio: 19 (ref 9–23)
BUN: 12 mg/dL (ref 6–20)
Bilirubin Total: 0.2 mg/dL (ref 0.0–1.2)
CO2: 22 mmol/L (ref 20–29)
Calcium: 9.5 mg/dL (ref 8.7–10.2)
Chloride: 105 mmol/L (ref 96–106)
Creatinine, Ser: 0.64 mg/dL (ref 0.57–1.00)
GFR calc Af Amer: 135 mL/min/{1.73_m2} (ref >59)
GFR calc non Af Amer: 117 mL/min/{1.73_m2} (ref >59)
Globulin, Total: 2.8 g/dL (ref 1.5–4.5)
Glucose: 99 mg/dL (ref 65–99)
Potassium: 4.4 mmol/L (ref 3.5–5.2)
Sodium: 139 mmol/L (ref 134–144)
Total Protein: 7.3 g/dL (ref 6.0–8.5)

## 2019-12-24 LAB — HEMOGLOBIN A1C
Est. average glucose Bld gHb Est-mCnc: 103 mg/dL
Hgb A1c MFr Bld: 5.2 % (ref 4.8–5.6)

## 2019-12-24 LAB — URINE CULTURE: Organism ID, Bacteria: NO GROWTH

## 2019-12-24 LAB — LIPID PANEL
Chol/HDL Ratio: 3.8 ratio (ref 0.0–4.4)
Cholesterol, Total: 157 mg/dL (ref 100–199)
HDL: 41 mg/dL (ref >39)
LDL Chol Calc (NIH): 98 mg/dL (ref 0–99)
Triglycerides: 96 mg/dL (ref 0–149)
VLDL Cholesterol Cal: 18 mg/dL (ref 5–40)

## 2019-12-24 LAB — TSH: TSH: 1.12 u[IU]/mL (ref 0.450–4.500)

## 2019-12-28 ENCOUNTER — Encounter: Payer: Self-pay | Admitting: Registered Nurse

## 2020-02-19 ENCOUNTER — Encounter: Payer: Self-pay | Admitting: Gastroenterology

## 2020-02-19 ENCOUNTER — Ambulatory Visit (INDEPENDENT_AMBULATORY_CARE_PROVIDER_SITE_OTHER): Payer: 59 | Admitting: Gastroenterology

## 2020-02-19 VITALS — BP 108/80 | HR 82 | Ht 60.63 in | Wt 158.0 lb

## 2020-02-19 DIAGNOSIS — K5900 Constipation, unspecified: Secondary | ICD-10-CM | POA: Diagnosis not present

## 2020-02-19 DIAGNOSIS — R1084 Generalized abdominal pain: Secondary | ICD-10-CM | POA: Insufficient documentation

## 2020-02-19 MED ORDER — HYOSCYAMINE SULFATE 0.125 MG SL SUBL: 0.1250 mg | SUBLINGUAL_TABLET | Freq: Three times a day (TID) | SUBLINGUAL | 11 refills | Status: DC | PRN

## 2020-02-19 MED ORDER — POLYETHYLENE GLYCOL 3350 17 GM/SCOOP PO POWD: 17.0000 g | Freq: Every day | ORAL | 11 refills | Status: DC

## 2020-02-19 NOTE — Progress Notes (Signed)
02/19/2020 [facility] Monica Steele [id number] [birth year]   HISTORY OF PRESENT ILLNESS: This is a 34 year old female who is a patient of Dr. Tarri Glenn.  She is here again today with complaints of mid abdominal pain and constipation.  She has undergone CT scan, EGD, and colonoscopy as well as an abdominal ultrasound all within the past year.  CT scan of the abdomen and pelvis with contrast in July showed small gallbladder polyp seen and that had been previously seen on ultrasound.  EGD was normal in 05/2019.  Colonoscopy in 05/2019 revealed 3 polyps that were removed and internal hemorrhoids.  Gastric biopsies revealed some gastritis, negative for H. pylori.  Esophageal and duodenal biopsies were normal.  2 of the 3 polyps were tubular adenomas and one with hyperplastic polyp.  She is here again today with complaints of mid abdominal pain.  She says it only hurts when she presses on it.  Does not bother her when she eats.  She has constipation as well so has a hard time passing her stools and sometimes go 2 to 4 days without a bowel movement.  She does use MiraLAX, but only uses it on occasion.  She is asking about lactulose and says that a friend of hers takes it and that she used some and it seemed to work very quickly.  She was asking if she needed another EGD.  Patient is primarily [place] speaking so the entire visit was performed via interpreter/translator.   Past Medical History:  Diagnosis Date  . Allergy   . Dry eyes   . Gallbladder polyp   . H/O seasonal allergies   . Headache    Past Surgical History:  Procedure Laterality Date  . NO PAST SURGERIES      reports that she has never smoked. She has never used smokeless tobacco. She reports that she does not drink alcohol and does not use drugs. family history includes Healthy in her father and mother. Allergies  Allergen Reactions  . Gadolinium Derivatives Nausea And Vomiting    Pt vomited after multihance injection.         Outpatient Encounter Medications as of 02/19/2020  Medication Sig  . [DISCONTINUED] pantoprazole (PROTONIX) 40 MG tablet TAKE 1 TABLET BY MOUTH TWICE A DAY  . omeprazole (PRILOSEC) 20 MG capsule Take 20 mg by mouth in the morning and at bedtime.  . [DISCONTINUED] cyclobenzaprine (FLEXERIL) 5 MG tablet TAKE 1 TABLET BY MOUTH THREE TIMES A DAY AS NEEDED FOR MUSCLE SPASMS  . [DISCONTINUED] fexofenadine (ALLEGRA ALLERGY) 180 MG tablet Take 1 tablet (180 mg total) by mouth daily.  . [DISCONTINUED] gabapentin (NEURONTIN) 300 MG capsule Take 1 capsule (300 mg total) by mouth at bedtime. Increase to 2 capsule at bedtime in one week.  . [DISCONTINUED] sucralfate (CARAFATE) 1 g tablet TAKE 1 TABLET BY MOUTH 4 (FOUR) TIMES DAILY - WITH MEALS AND AT BEDTIME. MAKE INTO A SLURRY.   No facility-administered encounter medications on file as of 02/19/2020.     REVIEW OF SYSTEMS  : All other systems reviewed and negative except where noted in the History of Present Illness.   PHYSICAL EXAM: BP 108/80   Pulse 82   Ht 5' 0.63" (1.54 m)   Wt 158 lb (71.7 kg)   BMI 30.22 kg/m  General: Well developed Asian female in no acute distress Head: Normocephalic and atraumatic Eyes:  Sclerae anicteric, conjunctiva pink. Ears: Normal auditory acuity  Lungs: Clear throughout to auscultation; no W/R/R. Heart:  Regular rate and rhythm; no M/R/G. Abdomen: Soft, non-distended.  BS present.  Mild TTP just to the right of the umbilicus. Musculoskeletal: Symmetrical with no gross deformities  Skin: No lesions on visible extremities Extremities: No edema  Neurological: Alert oriented x 4, grossly non-focal Psychological:  Alert and cooperative. Normal mood and affect  ASSESSMENT AND PLAN: *34 year old female with complaints of mid abdominal pain and constipation: She has had CT scan, colonoscopy, and endoscopy that have all been unremarkable as to any cause of her symptoms.  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  Her  abdominal pain is only present when you press on her abdomen.  Question if it is related to constipation when she has not moved her bowels as often, etc.  I have advised her that she needs to begin using the MiraLAX daily.  She asked me about lactulose, I told her that is certainly an option, but would prefer to see how she does with the MiraLAX daily first.  We will send a prescription for Levsin to use as needed for abdominal cramping and spasming as well.   CC:  Maximiano Coss, NP

## 2020-02-19 NOTE — Patient Instructions (Signed)
If you are age 34 or older, your body mass index should be between 23-30. Your Body mass index is 30.22 kg/m. If this is out of the aforementioned range listed, please consider follow up with your Primary Care Provider.  If you are age 10 or younger, your body mass index should be between 19-25. Your Body mass index is 30.22 kg/m. If this is out of the aformentioned range listed, please consider follow up with your Primary Care Provider.   We have sent the following medications to your pharmacy for you to pick up at your convenience: Levsin 0.125 SL tablet every 8 hours as needed.  Start Miralax 1 capful daily in 8 ounces of liquid.

## 2020-02-19 NOTE — Progress Notes (Signed)
Reviewed.  [place] L. Ally Knodel, MD, MPH  

## 2020-02-22 ENCOUNTER — Other Ambulatory Visit: Payer: Self-pay | Admitting: Gastroenterology

## 2020-03-12 ENCOUNTER — Other Ambulatory Visit: Payer: Self-pay | Admitting: Gastroenterology

## 2020-03-31 ENCOUNTER — Other Ambulatory Visit: Payer: Self-pay | Admitting: Gastroenterology

## 2020-04-18 ENCOUNTER — Other Ambulatory Visit: Payer: Self-pay | Admitting: Gastroenterology

## 2020-04-18 DIAGNOSIS — R1310 Dysphagia, unspecified: Secondary | ICD-10-CM

## 2020-04-19 ENCOUNTER — Other Ambulatory Visit: Payer: Self-pay | Admitting: Gastroenterology

## 2020-05-09 ENCOUNTER — Ambulatory Visit (HOSPITAL_COMMUNITY): Payer: Self-pay

## 2020-06-06 ENCOUNTER — Ambulatory Visit (HOSPITAL_COMMUNITY): Payer: Self-pay

## 2020-06-24 ENCOUNTER — Encounter: Payer: Self-pay | Admitting: Gastroenterology

## 2020-06-24 ENCOUNTER — Ambulatory Visit: Payer: 59 | Admitting: Gastroenterology

## 2020-06-24 VITALS — BP 102/78 | HR 78 | Ht 60.76 in | Wt 151.4 lb

## 2020-06-24 DIAGNOSIS — R102 Pelvic and perineal pain: Secondary | ICD-10-CM

## 2020-06-24 DIAGNOSIS — K219 Gastro-esophageal reflux disease without esophagitis: Secondary | ICD-10-CM

## 2020-06-24 DIAGNOSIS — K5900 Constipation, unspecified: Secondary | ICD-10-CM | POA: Diagnosis not present

## 2020-06-24 MED ORDER — ESOMEPRAZOLE SODIUM 40 MG IV SOLR: 40.0000 mg | Freq: Every day | INTRAVENOUS | 5 refills | Status: DC

## 2020-06-24 MED ORDER — ESOMEPRAZOLE MAGNESIUM 40 MG PO CPDR: 40.0000 mg | DELAYED_RELEASE_CAPSULE | Freq: Every day | ORAL | 5 refills | Status: DC

## 2020-06-24 NOTE — Progress Notes (Signed)
06/24/2020 [facility] Monica Steele [id number] [birth year]   HISTORY OF PRESENT ILLNESS: This is a 35 year old female who is a patient of Dr. Jacqualyn Posey.  She is here today again with complaints of constipation and acid reflux.  She admits that she does not use her MiraLAX every day.  She reports getting acid reflux within a few minutes of eating.  She's had extensive evaluation including EGD, colonoscopy, CT scan, ultrasound that have all been unrevealing.  Labs have been unremarkable.  H. pylori breath test in 2020 was negative.  She is currently on pantoprazole 40 mg twice daily.  The patient is primarily [place] speaking so interpreter was present during the entirety of the visit.  EGD was normal in 05/2019.  Colonoscopy in 05/2019 revealed 3 polyps that were removed and internal hemorrhoids.  Gastric biopsies revealed some gastritis, negative for H. pylori.  Esophageal and duodenal biopsies were normal.  2 of the 3 polyps were tubular adenomas and one with hyperplastic polyp.  Past Medical History:  Diagnosis Date  . Allergy   . Dry eyes   . Gallbladder polyp   . H/O seasonal allergies   . Headache    Past Surgical History:  Procedure Laterality Date  . NO PAST SURGERIES      reports that she has never smoked. She has never used smokeless tobacco. She reports that she does not drink alcohol and does not use drugs. family history includes Healthy in her father and mother. Allergies  Allergen Reactions  . Gadolinium Derivatives Nausea And Vomiting    Pt vomited after multihance injection.       Outpatient Encounter Medications as of 06/24/2020  Medication Sig  . pantoprazole (PROTONIX) 40 MG tablet TAKE 1 TABLET BY MOUTH TWICE A DAY  . polyethylene glycol powder (GLYCOLAX/MIRALAX) 17 GM/SCOOP powder Take 17 g by mouth daily.  [place]us Gunning BOWEL PREP KIT 17.5-3.13-1.6 GM/177ML SOLN TAKE AS DIRECTED  . [DISCONTINUED] fexofenadine (ALLEGRA ALLERGY) 180 MG tablet Take 1 tablet (180 mg  total) by mouth daily.  . [DISCONTINUED] gabapentin (NEURONTIN) 300 MG capsule Take 1 capsule (300 mg total) by mouth at bedtime. Increase to 2 capsule at bedtime in one week.  . [DISCONTINUED] hyoscyamine (LEVSIN SL) 0.125 MG SL tablet Place 1 tablet (0.125 mg total) under the tongue every 8 (eight) hours as needed. (Patient not taking: Reported on 06/24/2020)  . [DISCONTINUED] omeprazole (PRILOSEC) 20 MG capsule Take 20 mg by mouth in the morning and at bedtime. (Patient not taking: Reported on 06/24/2020)   No facility-administered encounter medications on file as of 06/24/2020.     REVIEW OF SYSTEMS  : All other systems reviewed and negative except where noted in the History of Present Illness.   PHYSICAL EXAM: BP 102/78 (BP Location: Left Arm, Patient Position: Sitting, Cuff Size: Normal)   Pulse 78   Ht 5' 0.76" (1.543 m)   Wt 151 lb 6 oz (68.7 kg)   BMI 28.83 kg/m  General: Well developed white female in no acute distress Head: Normocephalic and atraumatic Eyes:  sclerae anicteric,conjunctive pink. Ears: Normal auditory acuity Neck: Supple, no masses.  Lungs: Clear throughout to auscultation Heart: Regular rate and rhythm Abdomen: Soft, non-distended.  BS present.  Mild diffuse TTP. Musculoskeletal: Symmetrical with no gross deformities  Skin: No lesions on visible extremities Extremities: No edema  Neurological: Alert oriented x 4, grossly non-focal Psychological:  Alert and cooperative. Normal mood and affect  ASSESSMENT AND PLAN: *Constipation: She should continue her  MiraLAX daily. *Pelvic pain: No GI source found.  She has had colonoscopy and CT scan within the past year.  We will continue to treat constipation.  Will place a referral to gynecology as she has not been seen by GYN in several years. *GERD: EGD in March 2021 was completely normal.  She has had extensive evaluation.  We will discontinue her pantoprazole and try Nexium 40 mg daily.  Prescription sent to pharmacy.   If no improvement with changing PPI then she may need 24-hour pH study.  **She will follow up and 6 to 8 weeks.   CC:  Maximiano Coss, NP

## 2020-06-24 NOTE — Patient Instructions (Addendum)
If you are age 35 or younger, your body mass index should be between 19-25. Your Body mass index is 28.83 kg/m. If this is out of the aformentioned range listed, please consider follow up with your Primary Care Provider.   You are scheduled to see Dr Tarri Glenn on 08-12-2020 at 9:50am.  Please stop pantoprazole and start [place] 40 mg one a day.   We will send your records to the OBGYN office. They will call you to schedule an appointment, if you don't hear from them call [phone] to make an appointment.   Thank you for trusting me with your gastrointestinal care!    Alonza Bogus, PA-C

## 2020-06-30 ENCOUNTER — Other Ambulatory Visit: Payer: Self-pay

## 2020-06-30 ENCOUNTER — Encounter: Payer: Self-pay | Admitting: Registered Nurse

## 2020-06-30 ENCOUNTER — Ambulatory Visit (INDEPENDENT_AMBULATORY_CARE_PROVIDER_SITE_OTHER): Payer: 59 | Admitting: Registered Nurse

## 2020-06-30 VITALS — BP 118/66 | HR 92 | Temp 98.1°F | Resp 15 | Ht 60.75 in | Wt 150.0 lb

## 2020-06-30 DIAGNOSIS — J302 Other seasonal allergic rhinitis: Secondary | ICD-10-CM | POA: Diagnosis not present

## 2020-06-30 DIAGNOSIS — G44209 Tension-type headache, unspecified, not intractable: Secondary | ICD-10-CM | POA: Diagnosis not present

## 2020-06-30 DIAGNOSIS — R42 Dizziness and giddiness: Secondary | ICD-10-CM

## 2020-06-30 LAB — COMPREHENSIVE METABOLIC PANEL
ALT: 12 U/L (ref 0–35)
AST: 11 U/L (ref 0–37)
Albumin: 4.3 g/dL (ref 3.5–5.2)
Alkaline Phosphatase: 76 U/L (ref 39–117)
BUN: 8 mg/dL (ref 6–23)
CO2: 28 mEq/L (ref 19–32)
Calcium: 9.8 mg/dL (ref 8.4–10.5)
Chloride: 104 mEq/L (ref 96–112)
Creatinine, Ser: 0.62 mg/dL (ref 0.40–1.20)
GFR: 115.66 mL/min (ref >60.00)
Glucose, Bld: 75 mg/dL (ref 70–99)
Potassium: 3.6 mEq/L (ref 3.5–5.1)
Sodium: 140 mEq/L (ref 135–145)
Total Bilirubin: 0.4 mg/dL (ref 0.2–1.2)
Total Protein: 8.2 g/dL (ref 6.0–8.3)

## 2020-06-30 LAB — CBC WITH DIFFERENTIAL/PLATELET
Basophils Absolute: 0 10*3/uL (ref 0.0–0.1)
Basophils Relative: 0.3 % (ref 0.0–3.0)
Eosinophils Absolute: 0.1 10*3/uL (ref 0.0–0.7)
Eosinophils Relative: 0.6 % (ref 0.0–5.0)
HCT: 42.2 % (ref 36.0–46.0)
Hemoglobin: 14.2 g/dL (ref 12.0–15.0)
Lymphocytes Relative: 12.9 % (ref 12.0–46.0)
Lymphs Abs: 1.4 10*3/uL (ref 0.7–4.0)
MCHC: 33.6 g/dL (ref 30.0–36.0)
MCV: 87.9 fl (ref 78.0–100.0)
Monocytes Absolute: 0.9 10*3/uL (ref 0.1–1.0)
Monocytes Relative: 8 % (ref 3.0–12.0)
Neutro Abs: 8.3 10*3/uL — ABNORMAL HIGH (ref 1.4–7.7)
Neutrophils Relative %: 78.2 % — ABNORMAL HIGH (ref 43.0–77.0)
Platelets: 356 10*3/uL (ref 150.0–400.0)
RBC: 4.81 Mil/uL (ref 3.87–5.11)
RDW: 12.6 % (ref 11.5–15.5)
WBC: 10.6 10*3/uL — ABNORMAL HIGH (ref 4.0–10.5)

## 2020-06-30 LAB — TSH: TSH: 0.98 u[IU]/mL (ref 0.35–4.50)

## 2020-06-30 LAB — HEMOGLOBIN A1C: Hgb A1c MFr Bld: 5.4 % (ref 4.6–6.5)

## 2020-06-30 MED ORDER — CETIRIZINE HCL 10 MG PO TABS: 10.0000 mg | ORAL_TABLET | Freq: Every day | ORAL | 11 refills | Status: DC

## 2020-06-30 MED ORDER — AZELASTINE HCL 0.1 % NA SOLN
1.0000 | Freq: Two times a day (BID) | NASAL | 12 refills | Status: DC
Start: 2020-06-30 — End: 2020-08-12

## 2020-06-30 NOTE — Progress Notes (Signed)
Acute Office Visit  Subjective:    Patient ID: Charissa Knowles [name], female    DOB: 05/07/1985, 35 y.o.   MRN: [id number]  Chief Complaint  Patient presents with  . Dizziness    Pt here for issues for dizzy spells when feeling over tired, this has been occurring for about 1 week, has happened about 2 times a day, has also had a headache throughout the week.   Interpreter Video: Kenyon Ana [phone]  HPI Patient is in today for dizzy spells.   Ongoing for one week. Happening 1-2 times day. Describes her dizziness as the world around staying still while she spins.  Also having headaches - sometimes lasting all day. Describes as band wrapping around forehead as tension type headache.  Does have blurry vision, denies photophobia and sound sensitivity  No major changes - did start esomeprazole 43m PO qd per GI, however, symptoms started prior to starting this medication Does note that with her worsening GERD and constipation over the past month or so, she has reduced her intake to 1-2 small bottles of water daily and 2 meals, usually some rice and a bowl of soup. Otherwise no changes.  Denies blood or mucus in stool, diarrhea, melena, chest pain, palpitations, shob, cough, doe.   Notes as well she has had more allergy symptoms lately, hoping for a nasal spray and antihistamine. Been an ongoing issue for her. No changes to hearing or lower respiratory symptoms but does note sinus pressure and pressure in ears without drainage.  Past Medical History:  Diagnosis Date  . Allergy   . Dry eyes   . Gallbladder polyp   . H/O seasonal allergies   . Headache     Past Surgical History:  Procedure Laterality Date  . NO PAST SURGERIES      Family History  Problem Relation Age of Onset  . Healthy Mother   . Healthy Father   . Colon cancer Neg Hx   . Esophageal cancer Neg Hx   . Prostate cancer Neg Hx   . Rectal cancer Neg Hx     Social History   Socioeconomic History  . Marital  status: Married    Spouse name: Not on file  . Number of children: 1  . Years of education: Not on file  . Highest education level: Not on file  Occupational History  . Not on file  Tobacco Use  . Smoking status: Never Smoker  . Smokeless tobacco: Never Used  Vaping Use  . Vaping Use: Never used  Substance and Sexual Activity  . Alcohol use: No  . Drug use: No  . Sexual activity: Not Currently    Comment: on period now 05-21-19  Other Topics Concern  . Not on file  Social History Narrative   Lives home with husband, HWarner Mccreedy and one child.  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  Caffeine one starbucks daily.   Social Determinants of Health   Financial Resource Strain: Not on file  Food Insecurity: Not on file  Transportation Needs: Not on file  Physical Activity: Not on file  Stress: Not on file  Social Connections: Not on file  Intimate Partner Violence: Not on file    Outpatient Medications Prior to Visit  Medication Sig Dispense Refill  . esomeprazole (NEXIUM) 40 MG capsule Take 1 capsule (40 mg total) by mouth daily at 12 noon. 30 capsule 5  . polyethylene glycol powder (GLYCOLAX/MIRALAX) 17  GM/SCOOP powder Take 17 g by mouth daily. 255 g 11  . SUPREP BOWEL PREP KIT 17.5-3.13-1.6 GM/177ML SOLN TAKE AS DIRECTED 354 mL 1   No facility-administered medications prior to visit.    Allergies  Allergen Reactions  . Gadolinium Derivatives Nausea And Vomiting    Pt vomited after multihance injection.     Review of Systems Per hpi      Objective:    Physical Exam Vitals and nursing note reviewed.  Constitutional:      General: She is not in acute distress.    Appearance: Normal appearance. She is not ill-appearing, toxic-appearing or diaphoretic.  Cardiovascular:     Rate and Rhythm: Normal rate and regular rhythm.     Pulses: Normal pulses.     Heart sounds: Normal heart sounds. No murmur heard. No friction rub. No gallop.   Pulmonary:      Effort: Pulmonary effort is normal. No respiratory distress.     Breath sounds: Normal breath sounds. No stridor. No wheezing, rhonchi or rales.  Chest:     Chest wall: No tenderness.  Skin:    General: Skin is warm and dry.     Capillary Refill: Capillary refill takes less than 2 seconds.  Neurological:     General: No focal deficit present.     Mental Status: She is alert and oriented to person, place, and time. Mental status is at baseline.  Psychiatric:        Mood and Affect: Mood normal.        Behavior: Behavior normal.        Thought Content: Thought content normal.        Judgment: Judgment normal.     BP 118/66   Pulse 92   Temp 98.1 F (36.7 C) (Temporal)   Resp 15   Ht 5' 0.75" (1.543 m)   Wt 150 lb (68 kg)   SpO2 99%   BMI 28.58 kg/m  Wt Readings from Last 3 Encounters:  06/30/20 150 lb (68 kg)  06/24/20 151 lb 6 oz (68.7 kg)  02/19/20 158 lb (71.7 kg)    There are no preventive care reminders to display for this patient.  There are no preventive care reminders to display for this patient.   Lab Results  Component Value Date   TSH 1.120 12/23/2019   Lab Results  Component Value Date   WBC 7.8 12/23/2019   HGB 14.2 12/23/2019   HCT 43.1 12/23/2019   MCV 89 12/23/2019   PLT 350.0 09/17/2019   Lab Results  Component Value Date   NA 139 12/23/2019   K 4.4 12/23/2019   CO2 22 12/23/2019   GLUCOSE 99 12/23/2019   BUN 12 12/23/2019   CREATININE 0.64 12/23/2019   BILITOT 0.2 12/23/2019   ALKPHOS 76 12/23/2019   AST 13 12/23/2019   ALT 14 12/23/2019   PROT 7.3 12/23/2019   ALBUMIN 4.5 12/23/2019   CALCIUM 9.5 12/23/2019   GFR 94.07 09/17/2019   Lab Results  Component Value Date   CHOL 157 12/23/2019   Lab Results  Component Value Date   HDL 41 12/23/2019   Lab Results  Component Value Date   LDLCALC 98 12/23/2019   Lab Results  Component Value Date   TRIG 96 12/23/2019   Lab Results  Component Value Date   CHOLHDL 3.8  12/23/2019   Lab Results  Component Value Date   HGBA1C 5.2 12/23/2019       Assessment & Plan:  Problem List Items Addressed This Visit   None   Visit Diagnoses    Dizziness    -  Primary   Relevant Orders   TSH   CBC with Differential/Platelet   Comprehensive metabolic panel   Insulin, random   Hemoglobin A1c   Tension-type headache, not intractable, unspecified chronicity pattern       Relevant Orders   TSH   CBC with Differential/Platelet   Comprehensive metabolic panel   Insulin, random   Hemoglobin A1c   Seasonal allergic rhinitis, unspecified trigger       Relevant Medications   cetirizine (ZYRTEC) 10 MG tablet   azelastine (ASTELIN) 0.1 % nasal spray       Meds ordered this encounter  Medications  . cetirizine (ZYRTEC) 10 MG tablet    Sig: Take 1 tablet (10 mg total) by mouth daily.    Dispense:  30 tablet    Refill:  11    Order Specific Question:   Supervising Provider    Answer:   Carlota Raspberry, JEFFREY R [[id number]]  . azelastine (ASTELIN) 0.1 % nasal spray    Sig: Place 1 spray into both nostrils 2 (two) times daily. Use in each nostril as directed    Dispense:  30 mL    Refill:  12    Order Specific Question:   Supervising Provider    Answer:   Carlota Raspberry, JEFFREY R [[id number]]   PLAN  Favor hypoglycemia given history. Possibly dehydration contributing as well. Encourage further PO intake of small meals or snacks through the day, encouraged adequate hydration in the interest of alleviating dehydration and potential aid in constipation relief.  Labs collected. Will follow up with the patient as warranted.  Cetirizine and azelastine for allergic rhinitis  Check in in around 1 week on symptoms. If worsening or failing to improve can consider trying at home Epley or referral to neuro.  Patient encouraged to call clinic with any questions, comments, or concerns.   Maximiano Coss, NP

## 2020-07-01 LAB — INSULIN, RANDOM: Insulin: 7.8 u[IU]/mL

## 2020-07-12 ENCOUNTER — Encounter: Payer: Self-pay | Admitting: Gastroenterology

## 2020-07-12 DIAGNOSIS — K219 Gastro-esophageal reflux disease without esophagitis: Secondary | ICD-10-CM | POA: Insufficient documentation

## 2020-07-12 DIAGNOSIS — R102 Pelvic and perineal pain: Secondary | ICD-10-CM | POA: Insufficient documentation

## 2020-07-12 NOTE — Progress Notes (Signed)
Reviewed.  Antonio Woodhams L. Peggy Loge, MD, MPH  

## 2020-07-28 ENCOUNTER — Other Ambulatory Visit: Payer: Self-pay | Admitting: Gastroenterology

## 2020-08-10 ENCOUNTER — Encounter: Payer: Self-pay | Admitting: Registered Nurse

## 2020-08-10 ENCOUNTER — Ambulatory Visit (INDEPENDENT_AMBULATORY_CARE_PROVIDER_SITE_OTHER): Payer: 59 | Admitting: Registered Nurse

## 2020-08-10 ENCOUNTER — Other Ambulatory Visit: Payer: Self-pay

## 2020-08-10 VITALS — BP 115/61 | HR 79 | Temp 98.3°F | Resp 18 | Ht 60.0 in | Wt 149.4 lb

## 2020-08-10 DIAGNOSIS — M26621 Arthralgia of right temporomandibular joint: Secondary | ICD-10-CM | POA: Diagnosis not present

## 2020-08-10 DIAGNOSIS — G518 Other disorders of facial nerve: Secondary | ICD-10-CM | POA: Diagnosis not present

## 2020-08-10 DIAGNOSIS — R002 Palpitations: Secondary | ICD-10-CM

## 2020-08-10 MED ORDER — PREDNISONE 10 MG (21) PO TBPK: ORAL_TABLET | ORAL | 0 refills | Status: DC

## 2020-08-10 MED ORDER — CYCLOBENZAPRINE HCL 5 MG PO TABS: 5.0000 mg | ORAL_TABLET | Freq: Three times a day (TID) | ORAL | 1 refills | Status: DC | PRN

## 2020-08-10 MED ORDER — GABAPENTIN 100 MG PO CAPS: 100.0000 mg | ORAL_CAPSULE | Freq: Three times a day (TID) | ORAL | 3 refills | Status: DC

## 2020-08-10 NOTE — Patient Instructions (Addendum)
Ms Monica Steele to see you, as always.  I have replaced the order for cardiology. They will call you soon  I have sent a referral to Physical therapy for your jaw clicking. They will call you in 2-3 weeks.  Medications today:  Flexeril (Cyclobenzaprine) take 5mg  by mouth once daily before bed. You can increase this up to 3 times daily as needed.  Prednisone (sterapred) take as the package indicates. This is a steroid to help with pain, inflammation, and neuropathy. Please finish the whole supply as indicated.  Gabapentin (Neurontin) take 100mg  by mouth up to three times daily as needed for pain and numbness. This helps relieve nerve pain  Please call me if symptoms worsen or fail to improve. Please call me if you have a rash develop.      [name] -  R?t vui ???c g?p b?n, nh? m?i khi.  Ti ? thay th? y l?nh cho khoa tim m?ch. H? s? g?i cho b?n s?m  Ti ? g?i gi?y gi?i thi?u ??n V?t l tr? li?u cho vi?c b?n b?m quai hm. H? s? g?i cho b?n sau 2-3 tu?n.  Thu?c ngy nay:  Flexeril (Cyclobenzaprine) u?ng 5mg  m?t l?n m?i ngy tr??c khi ?i ng?. B?n c th? t?ng ?i?u ny ln ??n 3 l?n m?i ngy n?u c?n.  Prednisone (thp) l?y nh? Falicia b ch? ra. ?y l m?t lo?i steroid gip gi?m ?au, vim v b?nh th?n kinh. Vui lng hon thnh ton b? ngu?n cung c?p nh? ? ch? ra.  Gabapentin (Neurontin) u?ng 100mg  t?i ?a ba l?n m?i ngy khi c?n ?? gi?m ?au v t. ?i?u ny gip gi?m ?au dy th?n kinh  Vui lng g?i cho ti n?u cc tri?u ch?ng x?u ?i ho?c khng c?i thi?n. Vui lng g?i cho ti n?u b?n b? pht ban.    If you have lab work done today you will be contacted with your lab results within the next 2 weeks.  If you have not heard from [place] then please contact us. The fastest way to get your results is to register for My Chart.   IF you received an x-ray today, you will receive an invoice from [facility]. Please contact [facility] at [phone] with questions or  concerns regarding your invoice.   IF you received labwork today, you will receive an invoice from [facility]. Please contact [facility] at [phone] with questions or concerns regarding your invoice.   Our billing staff will not be able to assist you with questions regarding bills from these companies.  You will be contacted with the lab results as soon as they are available. The fastest way to get your results is to activate your My Chart account. Instructions are located on the last page of this paperwork. If you have not heard from [place] regarding the results in 2 weeks, please contact this office.

## 2020-08-10 NOTE — Progress Notes (Signed)
Established Patient Office Visit  Subjective:  Patient ID: Monica Steele, female    DOB: [birth year]  Age: 35 y.o. MRN: [id number]  CC:  Chief Complaint  Patient presents with  . Facial Pain    Patient states she has been having some right side face pain and numbness for about one week. Patient states that the pain is constant.    HPI [facility] Amelia Jo Pro presents for face pain  One side Ongoing for around one week Some numbness No paralysis or rash  Has not happened before No sick contacts  No dysphagia  Notes TMJ pain. Ongoing for some time. Clicking and occasionally becoming stuck open  Hx of palpitations, ongoing. No acute concerns for LOC, headaches, chest pain, doe, shob, or other cv symptoms bp wnl today No hx of adverse cv event  Past Medical History:  Diagnosis Date  . Allergy   . Dry eyes   . Gallbladder polyp   . H/O seasonal allergies   . Headache     Past Surgical History:  Procedure Laterality Date  . COLONOSCOPY  2021  . ESOPHAGOGASTRODUODENOSCOPY ENDOSCOPY  2021    Family History  Problem Relation Age of Onset  . Healthy Mother   . Healthy Father   . Colon cancer Neg Hx   . Esophageal cancer Neg Hx   . Prostate cancer Neg Hx   . Rectal cancer Neg Hx     Social History   Socioeconomic History  . Marital status: Married    Spouse name: Not on file  . Number of children: 1  . Years of education: Not on file  . Highest education level: Not on file  Occupational History  . Not on file  Tobacco Use  . Smoking status: Never Smoker  . Smokeless tobacco: Never Used  Vaping Use  . Vaping Use: Never used  Substance and Sexual Activity  . Alcohol use: No  . Drug use: No  . Sexual activity: Not Currently    Comment: on period now 05-21-19  Other Topics Concern  . Not on file  Social History Narrative   Lives home with husband, [name], and one child.  She works as Engineer, civil (consulting) in [place], [place].  Education 8th grade.  Caffeine one  [employer] daily.   Social Determinants of Health   Financial Resource Strain: Not on file  Food Insecurity: Not on file  Transportation Needs: Not on file  Physical Activity: Not on file  Stress: Not on file  Social Connections: Not on file  Intimate Partner Violence: Not on file    Outpatient Medications Prior to Visit  Medication Sig Dispense Refill  . azelastine (ASTELIN) 0.1 % nasal spray Place 1 spray into both nostrils 2 (two) times daily. Use in each nostril as directed 30 mL 12  . cetirizine (ZYRTEC) 10 MG tablet Take 1 tablet (10 mg total) by mouth daily. 30 tablet 11  . esomeprazole (NEXIUM) 40 MG capsule Take 1 capsule (40 mg total) by mouth daily at 12 noon. 30 capsule 5  . GAVILAX 17 GM/SCOOP powder DISSOLVE 17 GRAMS INTO WATER AND DRINK BY MOUTH EVERY DAY 510 g 5  . SUPREP BOWEL PREP KIT 17.5-3.13-1.6 GM/177ML SOLN TAKE AS DIRECTED 354 mL 1   No facility-administered medications prior to visit.    Allergies  Allergen Reactions  . Gadolinium Derivatives Nausea And Vomiting    Pt vomited after multihance injection.     ROS Review of Systems  Constitutional: Negative.   HENT: Negative.   Eyes: Negative.   Respiratory: Negative.   Cardiovascular: Negative.   Gastrointestinal: Negative.   Genitourinary: Negative.   Musculoskeletal: Negative.   Skin: Negative.   Neurological: Negative.   Psychiatric/Behavioral: Negative.   All other systems reviewed and are negative.     Objective:    Physical Exam Vitals and nursing note reviewed.  Constitutional:      General: She is not in acute distress.    Appearance: Normal appearance. She is normal weight. She is not ill-appearing, toxic-appearing or diaphoretic.  Eyes:     General: No visual field deficit. Cardiovascular:     Rate and Rhythm: Normal rate and regular rhythm.     Heart sounds: Normal heart sounds. No murmur heard. No friction rub. No gallop.   Pulmonary:     Effort: Pulmonary effort is  normal. No respiratory distress.     Breath sounds: Normal breath sounds. No stridor. No wheezing, rhonchi or rales.  Chest:     Chest wall: No tenderness.  Skin:    General: Skin is warm and dry.  Neurological:     General: No focal deficit present.     Mental Status: She is alert and oriented to person, place, and time. Mental status is at baseline.     Cranial Nerves: No cranial nerve deficit or facial asymmetry.     Sensory: No sensory deficit.     Motor: No weakness.     Coordination: Coordination normal.     Gait: Gait normal.     Deep Tendon Reflexes: Reflexes normal.  Psychiatric:        Mood and Affect: Mood normal.        Behavior: Behavior normal.        Thought Content: Thought content normal.        Judgment: Judgment normal.     BP 115/61   Pulse 79   Temp 98.3 F (36.8 C) (Temporal)   Resp 18   Ht 5' (1.524 m)   Wt 149 lb 6.4 oz (67.8 kg)   SpO2 99%   BMI 29.18 kg/m  Wt Readings from Last 3 Encounters:  08/10/20 149 lb 6.4 oz (67.8 kg)  06/30/20 150 lb (68 kg)  06/24/20 151 lb 6 oz (68.7 kg)     Health Maintenance Due  Topic Date Due  . COVID-19 Vaccine (3 - Booster for Pfizer series) 12/29/2019  . PAP SMEAR-Modifier  04/20/2020    There are no preventive care reminders to display for this patient.  Lab Results  Component Value Date   TSH 0.98 06/30/2020   Lab Results  Component Value Date   WBC 10.6 (H) 06/30/2020   HGB 14.2 06/30/2020   HCT 42.2 06/30/2020   MCV 87.9 06/30/2020   PLT 356.0 06/30/2020   Lab Results  Component Value Date   NA 140 06/30/2020   K 3.6 06/30/2020   CO2 28 06/30/2020   GLUCOSE 75 06/30/2020   BUN 8 06/30/2020   CREATININE 0.62 06/30/2020   BILITOT 0.4 06/30/2020   ALKPHOS 76 06/30/2020   AST 11 06/30/2020   ALT 12 06/30/2020   PROT 8.2 06/30/2020   ALBUMIN 4.3 06/30/2020   CALCIUM 9.8 06/30/2020   GFR 115.66 06/30/2020   Lab Results  Component Value Date   CHOL 157 12/23/2019   Lab Results   Component Value Date   HDL 41 12/23/2019   Lab Results  Component Value Date   LDLCALC 98  12/23/2019   Lab Results  Component Value Date   TRIG 96 12/23/2019   Lab Results  Component Value Date   CHOLHDL 3.8 12/23/2019   Lab Results  Component Value Date   HGBA1C 5.4 06/30/2020      Assessment & Plan:   Problem List Items Addressed This Visit   None     No orders of the defined types were placed in this encounter.   Follow-up: No follow-ups on file.   PLAN  Neuro exam reassuring. No apparent palsy or trigeminal neuraliga. Suspect related to past fall / strike to head, likely some neuropathic pain in face. Will treat as above  Refer to PT for tmj. Can use flexeril to help in interim  Refer to cardiology for palpitations. Suspect related to anxiety as much as of true cardiac origin.  Patient encouraged to call clinic with any questions, comments, or concerns.  Maximiano Coss, NP

## 2020-08-12 ENCOUNTER — Telehealth: Payer: Self-pay | Admitting: *Deleted

## 2020-08-12 ENCOUNTER — Other Ambulatory Visit: Payer: Self-pay

## 2020-08-12 ENCOUNTER — Encounter: Payer: Self-pay | Admitting: Nurse Practitioner

## 2020-08-12 ENCOUNTER — Other Ambulatory Visit (HOSPITAL_COMMUNITY)
Admission: RE | Admit: 2020-08-12 | Discharge: 2020-08-12 | Disposition: A | Discharge status: Home / Self Care | Payer: 59 | Source: Ambulatory Visit | Attending: Nurse Practitioner | Admitting: Nurse Practitioner

## 2020-08-12 ENCOUNTER — Encounter: Payer: Self-pay | Admitting: Gastroenterology

## 2020-08-12 ENCOUNTER — Ambulatory Visit: Payer: 59 | Admitting: Nurse Practitioner

## 2020-08-12 ENCOUNTER — Ambulatory Visit (INDEPENDENT_AMBULATORY_CARE_PROVIDER_SITE_OTHER): Payer: 59 | Admitting: Gastroenterology

## 2020-08-12 VITALS — BP 120/70 | HR 71 | Ht 61.0 in | Wt 164.2 lb

## 2020-08-12 VITALS — BP 100/70 | HR 80 | Resp 14 | Ht 60.5 in | Wt 150.0 lb

## 2020-08-12 DIAGNOSIS — R1084 Generalized abdominal pain: Secondary | ICD-10-CM | POA: Diagnosis not present

## 2020-08-12 DIAGNOSIS — K219 Gastro-esophageal reflux disease without esophagitis: Secondary | ICD-10-CM

## 2020-08-12 DIAGNOSIS — R102 Pelvic and perineal pain: Secondary | ICD-10-CM

## 2020-08-12 DIAGNOSIS — Z01419 Encounter for gynecological examination (general) (routine) without abnormal findings: Secondary | ICD-10-CM

## 2020-08-12 DIAGNOSIS — Z23 Encounter for immunization: Secondary | ICD-10-CM | POA: Diagnosis not present

## 2020-08-12 DIAGNOSIS — Z113 Encounter for screening for infections with a predominantly sexual mode of transmission: Secondary | ICD-10-CM | POA: Diagnosis not present

## 2020-08-12 DIAGNOSIS — K5900 Constipation, unspecified: Secondary | ICD-10-CM | POA: Diagnosis not present

## 2020-08-12 LAB — WET PREP FOR TRICH, YEAST, CLUE

## 2020-08-12 LAB — PREGNANCY, URINE: Preg Test, Ur: NEGATIVE

## 2020-08-12 MED ORDER — ESOMEPRAZOLE MAGNESIUM 40 MG PO CPDR: 40.0000 mg | DELAYED_RELEASE_CAPSULE | Freq: Two times a day (BID) | ORAL | 1 refills | Status: DC

## 2020-08-12 MED ORDER — SUCRALFATE 1 GM/10ML PO SUSP: 1.0000 g | Freq: Four times a day (QID) | ORAL | 1 refills | Status: DC

## 2020-08-12 NOTE — Progress Notes (Signed)
35 y.o. G57P1011 Married [place] female here for new patient annual exam and discuss pelvic pain.   Has been seen by PCP and recommended full GYN exam  CeCe interpretor- #[id number]  Here for evaluation of pelvic pain that she has been experiencing x 1 year. It is located in lower left quadrant and around umbilicus, constant, on a scale of 1-10 considers it to 8-9. Increasing getting worse, can not tolerate pants because of the pressure in abdomen x 1 year. Can't think of anything that makes the pain better or worse. Unrelated to movement, exercise, eating. Denies pain with intercourse.  She went to GI specialist and had colonoscopy and did not offer any answers for pain. Bowel movements are hard, has always had constipation and she reports this is different pain than from her constipation. Denies nausea or vomiting.  Denies vaginal irritation, itching or burning.  During period, pain gets worse from cramps, but it is a different pain.  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            Sexually active: Yes.    The current method of family planning is none.    Exercising: No.  The patient does not participate in regular exercise at present. Smoker:  no  Health Maintenance: Pap:  unsure History of abnormal Pap:  no MMG:  never Colonoscopy:  05-21-19 for abdominal pain showed polyp, 5-7 year f/u  BMD:   never Gardasil:   no Covid-19: completed  x2 Hep C testing: no Screening Labs: PCP   reports that she has never smoked. She has never used smokeless tobacco. She reports that she does not drink alcohol and does not use drugs.  Past Medical History:  Diagnosis Date  . Allergy   . Dry eyes   . Gallbladder polyp   . GERD (gastroesophageal reflux disease)   . H/O seasonal allergies   . Headache     Past Surgical History:  Procedure Laterality Date  . COLONOSCOPY  2021  . ESOPHAGOGASTRODUODENOSCOPY ENDOSCOPY  2021    Current Outpatient Medications  Medication Sig Dispense Refill  . cetirizine (ZYRTEC) 10 MG tablet Take 1 tablet (10 mg total) by mouth daily. 30 tablet 11  . esomeprazole (NEXIUM) 40 MG capsule Take 1 capsule (40 mg total) by mouth 2 (two) times daily before a meal. 180 capsule 1  . gabapentin (NEURONTIN) 100 MG capsule Take 1 capsule (100 mg total) by mouth 3 (three) times daily. 90 capsule 3  . GAVILAX 17 GM/SCOOP powder DISSOLVE 17 GRAMS INTO WATER AND DRINK BY MOUTH EVERY DAY 510 g 5  . predniSONE (STERAPRED UNI-PAK 21 TAB) 10 MG (21) TBPK tablet Take per package instructions. Do not skip doses. Finish entire supply. 1 each 0  . sucralfate (CARAFATE) 1 GM/10ML suspension Take 10 mLs (1 g total) by mouth 4 (four) times daily. 420 mL 1   No current facility-administered medications for this visit.    Family History  Problem Relation Age of Onset  . Healthy Mother   . Healthy Father   . Colon cancer Neg Hx   .  Esophageal cancer Neg Hx   . Prostate cancer Neg Hx   . Rectal cancer Neg Hx     Review of Systems  Gastrointestinal: Positive for abdominal pain.  All other systems reviewed and are negative.   Exam:   BP 100/70 (BP Location: Right Arm, Patient Position: Sitting, Cuff Size: Normal)   Pulse 80   Resp 14   Ht 5' 0.5" (1.537 m)   Wt 150 lb (68 kg)   LMP 07/27/2020   BMI 28.81 kg/m   Height: 5' 0.5" (153.7 cm)  General appearance: alert, cooperative and appears stated age, no acute  distress Head: Normocephalic, without obvious abnormality Neck: no adenopathy, thyroid normal to inspection and palpation Lungs: clear to auscultation bilaterally Breasts: normal appearance, no masses or tenderness Heart: regular rate and rhythm Abdomen: soft, no masses,  no organomegaly, see diagram Extremities: extremities normal, no edema Skin: No rashes or lesions Lymph nodes: Cervical, supraclavicular, and axillary nodes normal. No abnormal inguinal nodes palpated Neurologic: Grossly normal  Physical Exam Abdominal:      Areas of tenderness during palpation, normal bowel sounds, no palpable masses or firmness  Pelvic: External genitalia:  no lesions              Urethra:  normal appearing urethra with no masses, tenderness or lesions              Bartholins and Skenes: normal                 Vagina: normal appearing vagina, appropriate for age, moderate amount white discharge, no lesions              Cervix: neg cervical motion tenderness, no visible lesions             Bimanual Exam:   Uterus:  normal size, contour, position, consistency, mobility, non-tender              Adnexa: no mass or fullness, mild tenderness on left side               Wet mount essentially normal (some WBC) UA essentially normal- (trace RBC 0-2) UPT- neg   Emiy, CMA Chaperone was present for exam.  A:  Well Woman Exam   Pelvic pain/Abdominal Pain - Plan: Urinalysis,Complete w/RFL Culture, WET PREP FOR TRICH, YEAST, CLUE, Pregnancy, urine  Visit for gynecologic examination - Plan: Cytology - PAP( [facility])  Screen for STD (sexually transmitted disease) - Plan: HIV antibody (with reflex), RPR, GC/CT collected with pap  P:   Pap : collected today  Medications: Pt desires HPV vaccine, will start series today Regarding pain: Unable to explain r/t GYN origin (pending results), unlikely Pelvic US to provide additional answers with normal exam and unremarkable CT results in July 2021   Encouraged to f/u with PCP for next steps

## 2020-08-12 NOTE — Patient Instructions (Addendum)
N?u b?n t? 49 tu?i tr? xu?ng, ch? s? kh?i c? th? c?a b?n nn t? 19-25. Ch? s? kh?i c? th? c?a b?n l 31,03 kg / m. N?u ?i?u ny n?m ngoi ph?m vi ? nu ???c li?t k, vui lng xem xt lin h? v?i Nh cung c?p d?ch v? ch?m [place] chnh c?a b?n.  Vui lng t?ng Nexium ln hai l?n m?t ngy.  Chng ti ? ln l?ch h?n g?p l?i Ti?n s? [place] vo ngy 26/7/22 lc 3:40 chi?u.  Th?t vui khi g?p b?n hm nay! C?m ?n b?n ? tin t??ng giao cho ti s? ch?m [place] c?a b?n v l?a ch?n [place].  Ti?n s? h?i ly     If you are age 27 or younger, your body mass index should be between 19-25. Your Body mass index is 31.03 kg/m. If this is out of the aformentioned range listed, please consider follow up with your Primary Care Provider.   Please increase the Nexium to twice a day.  We have scheduled you a follow up with Dr. Tarri Glenn on 10/12/20 at 3:40 pm.  It was great seeing you today! Thank you for entrusting me with your care and choosing [facility].  Dr. Tarri Glenn

## 2020-08-12 NOTE — Telephone Encounter (Signed)
Prior authorization for sucralfate suspension sent via [email or web address]. We will await response from insurance company. Patient has previously tried Nexium, omeprazole and sucralfate tablet for her dx of gastritis.

## 2020-08-12 NOTE — Progress Notes (Signed)
Referring Provider: Maximiano Coss, NP Primary Care Physician:  Maximiano Coss, NP  Chief complaint:  Abdominal pain   IMPRESSION:  Upper abdominal pain with history of H pylori negative gastritis and gastric erosion: No other source identified on ultrasound, CT, EGD, or colonoscopy. Liver enzymes and pancreatic enzymes were normal. Reviewed evaluation with the patient given her concerns for undiagnosed cancer.   Intermittent Reflux: Incomplete relief on Nexium QD. No alarm features.   Constipation: She does not feel this is related to abdominal pain. Although I wonder if her abdominal pain would improve with improved constipation.   Tubular adenoma of colon 2 tubular adenomas removed on colonoscopy 05/21/2019.  Surveillance colonoscopy recommended in 2028.  Gastric polyp: Small polyp seen on ultrasound and CT. No follow-up needed.   No family history of esophageal cancer, stomach cancer, or colon cancer or polyps  PLAN: - Increase Nexium to BID - Avoid all NSAIDs - Consider trial of TCA or SSRI in the future - Continue Miralax daily - Follow-up in 6-8 weeks, earlier if needed  Please see the "Patient Instructions" section for addition details about the plan.  HPI: Monica Steele is a 35 y.o. female who returns in follow-up.  The interval history is obtained through the patient with the assistance of a [place] interpreter via iPAD.  She was last seen by Alonza Bogus 06/24/2020 for ongoing complaints of abdominal pain, constipation and reflux.  Evaluation for abdominal pain over the last year has included CT scan, EGD, colonoscopy, esophagram and abdominal ultrasound.  CT scan showed a small (19m) gallbladder polyp but was otherwise normal.  EGD was normal in 05/2019. Colonoscopy in 3/2021revealed 3 polyps that were removed and internal hemorrhoids. Gastric biopsies revealed some gastritis, negative for H. pylori. Esophageal and duodenal biopsies were normal. 2 of the 3  polyps were tubular adenomas and one with hyperplastic polyp.  Reports ongoing pain, bloating and eructation after eating despite using Nexium. Worsened after eating. She reports 9 out of 10 when she pushes on her epigastrium. The Nexium has helped with the reflux but not the pain. Also has ongoing constipation with a BM. Having a BM every day to every other day. Does not use MiraLAX regularly. Does not feel that the pain and constipation are related.   Despite reviewing her evaluation, she is concerned about cancer because her symptoms have persisted for at least a year. Weight is stable. No blood or mucous in the stool.    Past Medical History:  Diagnosis Date  . Allergy   . Dry eyes   . Gallbladder polyp   . H/O seasonal allergies   . Headache     Past Surgical History:  Procedure Laterality Date  . COLONOSCOPY  2021  . ESOPHAGOGASTRODUODENOSCOPY ENDOSCOPY  2021    Current Outpatient Medications  Medication Sig Dispense Refill  . azelastine (ASTELIN) 0.1 % nasal spray Place 1 spray into both nostrils 2 (two) times daily. Use in each nostril as directed 30 mL 12  . cetirizine (ZYRTEC) 10 MG tablet Take 1 tablet (10 mg total) by mouth daily. 30 tablet 11  . cyclobenzaprine (FLEXERIL) 5 MG tablet Take 1 tablet (5 mg total) by mouth 3 (three) times daily as needed for muscle spasms. 30 tablet 1  . esomeprazole (NEXIUM) 40 MG capsule Take 1 capsule (40 mg total) by mouth daily at 12 noon. 30 capsule 5  . gabapentin (NEURONTIN) 100 MG capsule Take 1 capsule (100 mg total) by mouth 3 (three)  times daily. 90 capsule 3  . GAVILAX 17 GM/SCOOP powder DISSOLVE 17 GRAMS INTO WATER AND DRINK BY MOUTH EVERY DAY 510 g 5  . predniSONE (STERAPRED UNI-PAK 21 TAB) 10 MG (21) TBPK tablet Take per package instructions. Do not skip doses. Finish entire supply. 1 each 0  . SUPREP BOWEL PREP KIT 17.5-3.13-1.6 GM/177ML SOLN TAKE AS DIRECTED 354 mL 1   No current facility-administered medications for this  visit.    Allergies as of 08/12/2020 - Review Complete 08/10/2020  Allergen Reaction Noted  . Gadolinium derivatives Nausea And Vomiting 08/27/2017    Family History  Problem Relation Age of Onset  . Healthy Mother   . Healthy Father   . Colon cancer Neg Hx   . Esophageal cancer Neg Hx   . Prostate cancer Neg Hx   . Rectal cancer Neg Hx        Physical Exam: General:   Alert,  well-nourished, pleasant and cooperative in NAD Head:  Normocephalic and atraumatic. Eyes:  Sclera clear, no icterus.   Conjunctiva pink. Abdomen:  Soft,nontender, nondistended, normal bowel sounds, no rebound or guarding. No hepatosplenomegaly.   Neurologic:  Alert and  oriented x4;  grossly nonfocal Skin:  Intact without significant lesions or rashes. Psych:  Alert and cooperative. Normal mood and affect.   Leander Tout L. Tarri Glenn, MD, MPH 08/12/2020, 9:49 AM

## 2020-08-13 LAB — CYTOLOGY - PAP
Chlamydia: NEGATIVE
Comment: NEGATIVE
Comment: NEGATIVE
Comment: NORMAL
Diagnosis: NEGATIVE
High risk HPV: NEGATIVE
Neisseria Gonorrhea: NEGATIVE

## 2020-08-13 LAB — HIV ANTIBODY (ROUTINE TESTING W REFLEX): HIV 1&2 Ab, 4th Generation: NONREACTIVE

## 2020-08-13 LAB — RPR: RPR Ser Ql: NONREACTIVE

## 2020-08-14 LAB — URINALYSIS, COMPLETE W/RFL CULTURE
Bilirubin Urine: NEGATIVE
Glucose, UA: NEGATIVE
Hyaline Cast: NONE SEEN /LPF
Ketones, ur: NEGATIVE
Leukocyte Esterase: NEGATIVE
Nitrites, Initial: NEGATIVE
Protein, ur: NEGATIVE
Specific Gravity, Urine: 1.025 (ref 1.001–1.035)
pH: 6 (ref 5.0–8.0)

## 2020-08-14 LAB — URINE CULTURE
MICRO NUMBER:: 11938525
SPECIMEN QUALITY:: ADEQUATE

## 2020-08-14 LAB — CULTURE INDICATED

## 2020-08-17 NOTE — Progress Notes (Signed)
Please call [place] interpretor for assistance in notifying patient about her results

## 2020-08-31 ENCOUNTER — Encounter: Payer: Self-pay | Admitting: Physical Therapy

## 2020-08-31 ENCOUNTER — Other Ambulatory Visit: Payer: Self-pay

## 2020-08-31 ENCOUNTER — Ambulatory Visit (INDEPENDENT_AMBULATORY_CARE_PROVIDER_SITE_OTHER): Payer: 59 | Admitting: Physical Therapy

## 2020-08-31 DIAGNOSIS — M26609 Unspecified temporomandibular joint disorder, unspecified side: Secondary | ICD-10-CM | POA: Diagnosis not present

## 2020-08-31 NOTE — Therapy (Signed)
[facility] [address] [place], [place], [address] Phone: [phone]   Fax:  [phone]  Physical Therapy Evaluation  Patient Details  Name: Monica Steele MRN: [id number] Date of Birth: 1985/07/25 Referring Provider (PT): Maximiano Coss   Encounter Date: 08/31/2020   PT End of Session - 08/31/20 1203     Visit Number 1    Number of Visits 12    Date for PT Re-Evaluation 10/26/20    Authorization Type [employer]    PT Start Time 1055    PT Stop Time 1130    PT Time Calculation (min) 35 min    Activity Tolerance Patient tolerated treatment well    Behavior During Therapy [facility] for tasks assessed/performed             Past Medical History:  Diagnosis Date   Allergy    Dry eyes    Gallbladder polyp    GERD (gastroesophageal reflux disease)    H/O seasonal allergies    Headache     Past Surgical History:  Procedure Laterality Date   COLONOSCOPY  2021   ESOPHAGOGASTRODUODENOSCOPY ENDOSCOPY  2021    There were no vitals filed for this visit.    Subjective Assessment - 08/31/20 1053     Subjective Pt had a fall  1.5 years ago, fell onto R side head/face. Reports pain in jaw followig this. She was getting some treatment, but was going to [place] which was a far drive, and did not finish, due to pandemic. She continues to have pain on R side of jaw. States no other pain from fall, or now, in neck/shoulder, etc.  Notes pain and clicking with wider Opening. Is able to eat without pain/clicking if she doesnt open mouth too wide. Increased opening/clicking is causing pain in jaw and headache on R side. Works at [employer]. [place] interpreter, here today with pt.    Limitations House hold activities    Patient Stated Goals decreased pain with jaw opening    Currently in Pain? Yes    Pain Score 4     Pain Location Jaw    Pain Orientation Right    Pain Descriptors / Indicators Aching    Pain Type Chronic  pain    Pain Radiating Towards pain causing headaches at times.    Pain Onset More than a month ago    Pain Frequency Intermittent    Aggravating Factors  jaw opening and lateral deviation    Pain Relieving Factors rest    Effect of Pain on Daily Activities decreased ability for eating                [facility] PT Assessment - 08/31/20 0001       Assessment   Medical Diagnosis TMJ/ R jaw pain    Referring Provider (PT) Maximiano Coss    Prior Therapy no      Precautions   Precautions None      Balance Screen   Has the patient fallen in the past 6 months No      Prior Function   Level of Independence Independent      Cognition   Overall Cognitive Status Within Functional Limits for tasks assessed      Posture/Postural Control   Posture Comments poor seated posture, rounded shoulders, fwd head      ROM / Strength   AROM / PROM / Strength AROM      AROM   Overall AROM  Comments cervical: WNL,  Jaw: mod limitation for opening, with pain and click,  Mild limitation with bil lateral deviation with pain and click.      Palpation   Palpation comment Tenderness at R TMJ      Special Tests   Other special tests Audible click with full mouth opening and lateral deviation bilaterally                        Objective measurements completed on examination: See above findings.       [facility] Adult PT Treatment/Exercise - 08/31/20 0001       Exercises   Exercises Neck;Other Exercises      Neck Exercises: Seated   Neck Retraction 15 reps    Other Seated Exercise scap retract x 10;    Other Seated Exercise TMJ: controlled opening, tongue clicks, and isometric deviation , all x 10                    PT Education - 08/31/20 1202     Education Details Pt education on TMJ, treatments, HEP, referral to another clinic for treatment. Discussed dry needling.    Person(s) Educated Patient;Other (comment)   interpreter   Methods  Explanation;Demonstration;Tactile cues;Verbal cues;Handout    Comprehension Verbalized understanding;Returned demonstration;Verbal cues required;Tactile cues required;Need further instruction              PT Short Term Goals - 08/31/20 1211       PT SHORT TERM GOAL #1   Title Pt to be independent wtih inital HEP    Time 2    Period Weeks    Status New    Target Date 09/14/20               PT Long Term Goals - 08/31/20 1208       PT LONG TERM GOAL #1   Title Pt to report decreased pain to 0-1/10 with full jaw opening    Time 8    Period Weeks    Status New    Target Date 10/26/20      PT LONG TERM GOAL #2   Title Pt to demo ability for full jaw opening and lateral deviation without click, to improve ability for eating and hygiene.    Time 8    Period Weeks    Status New    Target Date 10/26/20      PT LONG TERM GOAL #3   Title Pt to be independent with final HEP    Time 8    Period Weeks    Status New    Target Date 10/26/20                    Plan - 08/31/20 1213     Clinical Impression Statement Pt presents with primary complaint of increased pain in R side of jaw, consistent with TMJ. She has increased pain and jaw clicking that is also contributing to headaches. She has pain and clicking with full mouth opening as well as with lateral deviation. Pt with decreased ability for eating, and performing oral hygiene. She will benefit from skilled PT to improve deficits and pain. Pt will be referred to another outpt clinic for specific TMJ treatment, will likely benefit from mobilization and dry needling to improve pain.    Personal Factors and Comorbidities Time since onset of injury/illness/exacerbation    Examination-Activity Limitations Self Feeding;Hygiene/Grooming    Stability/Clinical Decision Making  Stable/Uncomplicated    Clinical Decision Making Low    Rehab Potential Good    PT Frequency 2x / week    PT Duration 6 weeks    PT  Treatment/Interventions ADLs/Self Care Home Management;Cryotherapy;Electrical Stimulation;DME Instruction;Ultrasound;Traction;Moist Heat;Iontophoresis 4mg /ml Dexamethasone;Functional mobility training;Therapeutic activities;Therapeutic exercise;Patient/family education;Neuromuscular re-education;Manual techniques;Taping;Dry needling;Passive range of motion;Spinal Manipulations;Joint Manipulations    PT Home Exercise Plan [email or web address]  added: controlled opening (not pictured)    Consulted and Agree with Plan of Care Patient             Patient will benefit from skilled therapeutic intervention in order to improve the following deficits and impairments:  Pain, Improper body mechanics, Decreased mobility, Increased muscle spasms, Decreased strength, Decreased range of motion, Decreased activity tolerance  Visit Diagnosis: Temporomandibular joint syndrome     Problem List Patient Active Problem List   Diagnosis Date Noted   Pelvic pain 07/12/2020   Gastroesophageal reflux disease without esophagitis 07/12/2020   Constipation 02/19/2020   Generalized abdominal pain 02/19/2020   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

## 2020-08-31 NOTE — Patient Instructions (Signed)
Access Code: [email or web address] URL: [email or web address] Date: 08/31/2020 Prepared by: Lyndee Hensen  Exercises  Controlled opening: x10, 3x/day   Tongue Clicks TMJ - 3 x daily - 1 sets - 10 reps Isometric Jaw Deviation - 3 x daily - 1 sets - 10 reps Seated Scapular Retraction - 2 x daily - 1 sets - 10 reps Seated Cervical Retraction - 2 x daily - 1 sets - 10 reps

## 2020-09-08 ENCOUNTER — Other Ambulatory Visit: Payer: Self-pay | Admitting: Registered Nurse

## 2020-09-08 DIAGNOSIS — G518 Other disorders of facial nerve: Secondary | ICD-10-CM

## 2020-09-09 ENCOUNTER — Other Ambulatory Visit: Payer: Self-pay | Admitting: Gastroenterology

## 2020-09-10 ENCOUNTER — Other Ambulatory Visit: Payer: Self-pay | Admitting: Gastroenterology

## 2020-09-10 DIAGNOSIS — R1310 Dysphagia, unspecified: Secondary | ICD-10-CM

## 2020-09-14 ENCOUNTER — Ambulatory Visit (HOSPITAL_BASED_OUTPATIENT_CLINIC_OR_DEPARTMENT_OTHER): Payer: 59 | Attending: Registered Nurse | Admitting: Physical Therapy

## 2020-09-14 ENCOUNTER — Encounter (HOSPITAL_BASED_OUTPATIENT_CLINIC_OR_DEPARTMENT_OTHER): Payer: Self-pay | Admitting: Physical Therapy

## 2020-09-14 ENCOUNTER — Other Ambulatory Visit: Payer: Self-pay

## 2020-09-14 DIAGNOSIS — M62838 Other muscle spasm: Secondary | ICD-10-CM | POA: Diagnosis present

## 2020-09-14 DIAGNOSIS — R293 Abnormal posture: Secondary | ICD-10-CM | POA: Diagnosis present

## 2020-09-14 NOTE — Therapy (Signed)
[facility] [address] [place], [place], [address] Phone: [phone]   Fax:  [phone]  Physical Therapy Treatment  Patient Details  Name: Monica Steele MRN: [id number] Date of Birth: Dec 14, 1985 Referring Provider (PT): Maximiano Coss   Encounter Date: 09/14/2020   PT End of Session - 09/14/20 1636     Visit Number 2    Number of Visits 12    Date for PT Re-Evaluation 10/26/20    Authorization Type [employer]    PT Start Time 0845    PT Stop Time 0927    PT Time Calculation (min) 42 min    Activity Tolerance Patient tolerated treatment well    Behavior During Therapy [facility] for tasks assessed/performed             Past Medical History:  Diagnosis Date   Allergy    Dry eyes    Gallbladder polyp    GERD (gastroesophageal reflux disease)    H/O seasonal allergies    Headache     Past Surgical History:  Procedure Laterality Date   COLONOSCOPY  2021   ESOPHAGOGASTRODUODENOSCOPY ENDOSCOPY  2021    There were no vitals filed for this visit.   Subjective Assessment - 09/14/20 1634     Subjective Patient reports her jaw continues to pop. She reports she is doing her exercises, but its unclear if she is doing them correctly.    Limitations House hold activities    Patient Stated Goals decreased pain with jaw opening    Currently in Pain? Yes    Pain Score 4     Pain Location Jaw    Pain Orientation Right    Pain Descriptors / Indicators Aching    Pain Type Chronic pain    Pain Radiating Towards pain cuasing headaches at time    Pain Onset More than a month ago    Pain Frequency Intermittent    Aggravating Factors  jaw opening    Pain Relieving Factors rest    Effect of Pain on Daily Activities decreased ability to eat                               [facility] Adult PT Treatment/Exercise - 09/14/20 0001       Neck Exercises: Standing   Other Standing Exercises scap retraction  2x10 red      Neck Exercises: Seated   Neck Retraction 15 reps    Other Seated Exercise scap retract x 10;    Other Seated Exercise TMJ: controlled opening, tongue clicks, and isometric deviation , all x 10      Manual Therapy   Manual Therapy Soft tissue mobilization    Manual therapy comments to upper trap, masseter; and lateral pterygoid              Trigger Point Dry Needling - 09/14/20 0001     Consent Given? Yes    Education Handout Provided Yes    Muscles Treated Head and Neck Lateral pterygoid;Masseter    Masseter Response Twitch reponse elicited;Palpable increased muscle length    Lateral pterygoid Response Twitch reponse elicited;Palpable increased muscle length                  PT Education - 09/14/20 1636     Education Details improtance of exercises. Reviewed anatomy of the jaw    Person(s) Educated Patient    Methods Explanation;Demonstration;Tactile cues;Verbal cues  Comprehension Verbalized understanding;Returned demonstration;Verbal cues required;Tactile cues required              PT Short Term Goals - 08/31/20 1211       PT SHORT TERM GOAL #1   Title Pt to be independent wtih inital HEP    Time 2    Period Weeks    Status New    Target Date 09/14/20               PT Long Term Goals - 08/31/20 1208       PT LONG TERM GOAL #1   Title Pt to report decreased pain to 0-1/10 with full jaw opening    Time 8    Period Weeks    Status New    Target Date 10/26/20      PT LONG TERM GOAL #2   Title Pt to demo ability for full jaw opening and lateral deviation without click, to improve ability for eating and hygiene.    Time 8    Period Weeks    Status New    Target Date 10/26/20      PT LONG TERM GOAL #3   Title Pt to be independent with final HEP    Time 8    Period Weeks    Status New    Target Date 10/26/20                   Plan - 09/14/20 1637     Clinical Impression Statement Patient continues to have  paopping after needling but she reported it may not have been as loud in her ear. She had less pain after soft tissue mobilization. Therapy will continue to review heer exercises. She was given exercises in vi[place]. We will continue with manual therapy and needling.    Personal Factors and Comorbidities Time since onset of injury/illness/exacerbation    Examination-Activity Limitations Self Feeding;Hygiene/Grooming    Stability/Clinical Decision Making Stable/Uncomplicated    Clinical Decision Making Low    Rehab Potential Good    PT Frequency 2x / week    PT Duration 6 weeks    PT Treatment/Interventions ADLs/Self Care Home Management;Cryotherapy;Electrical Stimulation;DME Instruction;Ultrasound;Traction;Moist Heat;Iontophoresis 4mg /ml Dexamethasone;Functional mobility training;Therapeutic activities;Therapeutic exercise;Patient/family education;Neuromuscular re-education;Manual techniques;Taping;Dry needling;Passive range of motion;Spinal Manipulations;Joint Manipulations    PT Home Exercise Plan [email or web address]  added: controlled opening (not pictured)    Consulted and Agree with Plan of Care Patient             Patient will benefit from skilled therapeutic intervention in order to improve the following deficits and impairments:  Pain, Improper body mechanics, Decreased mobility, Increased muscle spasms, Decreased strength, Decreased range of motion, Decreased activity tolerance  Visit Diagnosis: Other muscle spasm  Abnormal posture     Problem List Patient Active Problem List   Diagnosis Date Noted   Pelvic pain 07/12/2020   Gastroesophageal reflux disease without esophagitis 07/12/2020   Constipation 02/19/2020   Generalized abdominal pain 02/19/2020   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

## 2020-09-22 ENCOUNTER — Other Ambulatory Visit: Payer: Self-pay

## 2020-09-22 ENCOUNTER — Encounter (HOSPITAL_BASED_OUTPATIENT_CLINIC_OR_DEPARTMENT_OTHER): Payer: Self-pay | Admitting: Physical Therapy

## 2020-09-22 ENCOUNTER — Ambulatory Visit (HOSPITAL_BASED_OUTPATIENT_CLINIC_OR_DEPARTMENT_OTHER): Payer: 59 | Attending: Registered Nurse | Admitting: Physical Therapy

## 2020-09-22 DIAGNOSIS — R293 Abnormal posture: Secondary | ICD-10-CM | POA: Insufficient documentation

## 2020-09-22 DIAGNOSIS — M62838 Other muscle spasm: Secondary | ICD-10-CM | POA: Diagnosis present

## 2020-09-22 NOTE — Therapy (Signed)
[facility] [address] [place], [place], [address] Phone: [phone]   Fax:  [phone]  Physical Therapy Treatment  Patient Details  Name: Monica Steele MRN: [id number] Date of Birth: September 05, 1985 Referring Provider (PT): Maximiano Coss   Encounter Date: 09/22/2020   PT End of Session - 09/22/20 0851     Visit Number 3    Number of Visits 12    Authorization Type [employer]    PT Start Time 0845    PT Stop Time 0930    PT Time Calculation (min) 45 min    Activity Tolerance Patient tolerated treatment well    Behavior During Therapy [facility] for tasks assessed/performed             Past Medical History:  Diagnosis Date   Allergy    Dry eyes    Gallbladder polyp    GERD (gastroesophageal reflux disease)    H/O seasonal allergies    Headache     Past Surgical History:  Procedure Laterality Date   COLONOSCOPY  2021   ESOPHAGOGASTRODUODENOSCOPY ENDOSCOPY  2021    There were no vitals filed for this visit.   Subjective Assessment - 09/22/20 0846     Subjective Hear the clicking on the Rt, mostly when I open wide or yawn. Occasional neck pain. HA occasionally- feels like somebody is knocking on my head on the Rt side.    Patient Stated Goals decreased pain with jaw opening    Currently in Pain? No/denies                [facility] PT Assessment - 09/22/20 0001       Posture/Postural Control   Posture Comments TMJ midline shifted Lt 1/4 cm                           [facility] Adult PT Treatment/Exercise - 09/22/20 0001       Neck Exercises: Seated   Other Seated Exercise tongue clicks    Other Seated Exercise mandibular depression with tongue on roof of mouth      Neck Exercises: Supine   Other Supine Exercise smooth opening- tongue roof of mouth, manual guidance    Other Supine Exercise haptic tapping tongue depressor in Lt bite      Manual Therapy   Manual Therapy Soft tissue  mobilization;Manual Traction    Soft tissue mobilization TMJ-surrounding musculature internal and external; Rt upper trap, bil suboccipitals    Manual Traction mandible                    PT Education - 09/22/20 1047     Education Details incr time required for education on exercise rationale & referrals to dentist/orthodontist    Person(s) Educated Patient    Methods Explanation;Handout    Comprehension Verbalized understanding;Need further instruction              PT Short Term Goals - 09/22/20 0912       PT SHORT TERM GOAL #1   Title Pt to be independent wtih inital HEP    Status Achieved               PT Long Term Goals - 08/31/20 1208       PT LONG TERM GOAL #1   Title Pt to report decreased pain to 0-1/10 with full jaw opening    Time 8    Period Weeks  Status New    Target Date 10/26/20      PT LONG TERM GOAL #2   Title Pt to demo ability for full jaw opening and lateral deviation without click, to improve ability for eating and hygiene.    Time 8    Period Weeks    Status New    Target Date 10/26/20      PT LONG TERM GOAL #3   Title Pt to be independent with final HEP    Time 8    Period Weeks    Status New    Target Date 10/26/20                   Plan - 09/22/20 1048     Clinical Impression Statement Pt tolerated exercises and manual therapy well today. able to reduce clicking with tongue as anchor on roof of mouth in wide opening. Lower midline off center to the Rt with Lt to Rt C curve in depression. 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    PT Treatment/Interventions ADLs/Self Care Home Management;Cryotherapy;Electrical Stimulation;DME Instruction;Ultrasound;Traction;Moist Heat;Iontophoresis 4mg /ml Dexamethasone;Functional mobility  training;Therapeutic activities;Therapeutic exercise;Patient/family education;Neuromuscular re-education;Manual techniques;Taping;Dry needling;Passive range of motion;Spinal Manipulations;Joint Manipulations    PT Home Exercise Plan [email or web address]    Consulted and Agree with Plan of Care Patient             Patient will benefit from skilled therapeutic intervention in order to improve the following deficits and impairments:  Pain, Improper body mechanics, Decreased mobility, Increased muscle spasms, Decreased strength, Decreased range of motion, Decreased activity tolerance  Visit Diagnosis: Other muscle spasm  Abnormal posture     Problem List Patient Active Problem List   Diagnosis Date Noted   Pelvic pain 07/12/2020   Gastroesophageal reflux disease without esophagitis 07/12/2020   Constipation 02/19/2020   Generalized abdominal pain 02/19/2020   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 Dashawna Delbridge PT, DPT 09/22/20 11:04 AM   [facility] [place], [place], [address] Phone: [phone]   Fax:  [phone]  Name: Monica Steele MRN: [id number] Date of Birth: 1986/01/23

## 2020-09-28 ENCOUNTER — Encounter (HOSPITAL_BASED_OUTPATIENT_CLINIC_OR_DEPARTMENT_OTHER): Payer: 59 | Admitting: Physical Therapy

## 2020-09-29 ENCOUNTER — Other Ambulatory Visit: Payer: Self-pay

## 2020-09-29 ENCOUNTER — Ambulatory Visit (HOSPITAL_BASED_OUTPATIENT_CLINIC_OR_DEPARTMENT_OTHER): Payer: 59 | Admitting: Physical Therapy

## 2020-09-29 ENCOUNTER — Encounter (HOSPITAL_BASED_OUTPATIENT_CLINIC_OR_DEPARTMENT_OTHER): Payer: Self-pay | Admitting: Physical Therapy

## 2020-09-29 DIAGNOSIS — M62838 Other muscle spasm: Secondary | ICD-10-CM | POA: Diagnosis not present

## 2020-09-29 DIAGNOSIS — R293 Abnormal posture: Secondary | ICD-10-CM

## 2020-09-29 NOTE — Therapy (Signed)
[facility] [address] [place], [place], [address] Phone: [phone]   Fax:  [phone]  Physical Therapy Treatment  Patient Details  Name: Monica Steele MRN: [id number] Date of Birth: May 27, 1985 Referring Provider (PT): Maximiano Coss   Encounter Date: 09/29/2020   PT End of Session - 09/29/20 0901     Visit Number 4    Number of Visits 12    Date for PT Re-Evaluation 10/26/20    Authorization Type [employer]    PT Start Time 0845    PT Stop Time 0927    PT Time Calculation (min) 42 min    Activity Tolerance Patient tolerated treatment well    Behavior During Therapy [facility] for tasks assessed/performed             Past Medical History:  Diagnosis Date   Allergy    Dry eyes    Gallbladder polyp    GERD (gastroesophageal reflux disease)    H/O seasonal allergies    Headache     Past Surgical History:  Procedure Laterality Date   COLONOSCOPY  2021   ESOPHAGOGASTRODUODENOSCOPY ENDOSCOPY  2021    There were no vitals filed for this visit.   Subjective Assessment - 09/29/20 0848     Subjective Patient reports the clicking is about the same. She has tried to find a dentist in the area but is having difficult finding one. Patient reports that she has occasional neck pain and headaches.    Patient is accompained by: Interpreter    Limitations House hold activities    Patient Stated Goals decreased pain with jaw opening    Currently in Pain? No/denies    Multiple Pain Sites No                               [facility] Adult PT Treatment/Exercise - 09/29/20 1101       Neck Exercises: Standing   Other Standing Exercises scap retraction grene x20    Other Standing Exercises cervical retractiob 2x15 with cuing for postrue      Neck Exercises: Supine   Other Supine Exercise haptic tapping tongue depressor in Lt bite                 Tounge clicking L87  Controlled opening x20     Trigger Point Dry Needling - 09/29/20 0001     Consent Given? Yes    Education Handout Provided Yes    Other Dry Needling some difficuty getting to the pterygoind today.    Masseter Response Twitch reponse elicited;Palpable increased muscle length    Lateral pterygoid Response Twitch reponse elicited;Palpable increased muscle length                  PT Education - 09/29/20 0901     Education Details Educated patient on importance of performing HEP and maintaining upright posture.    Person(s) Educated Patient    Methods Explanation    Comprehension Verbalized understanding;Need further instruction              PT Short Term Goals - 09/22/20 0912       PT SHORT TERM GOAL #1   Title Pt to be independent wtih inital HEP    Status Achieved               PT Long Term Goals - 08/31/20 1208       PT  LONG TERM GOAL #1   Title Pt to report decreased pain to 0-1/10 with full jaw opening    Time 8    Period Weeks    Status New    Target Date 10/26/20      PT LONG TERM GOAL #2   Title Pt to demo ability for full jaw opening and lateral deviation without click, to improve ability for eating and hygiene.    Time 8    Period Weeks    Status New    Target Date 10/26/20      PT LONG TERM GOAL #3   Title Pt to be independent with final HEP    Time 8    Period Weeks    Status New    Target Date 10/26/20                   Plan - 09/29/20 0902     Clinical Impression Statement Patient continues to have popping of her jaw despite inteventions and consitent perfromance of her HEP. Therapy perfrome dmanual therapy to her jaw. She had some difficulty relaxing with the moiblization. Therapy also perfromed needling and extenal trigger point release. Again she had no dffernece in symptoms. Therapy needled her masseter and lateral Pytragoid    Personal Factors and Comorbidities Time since onset of injury/illness/exacerbation    Examination-Activity  Limitations Self Feeding;Hygiene/Grooming    Stability/Clinical Decision Making Stable/Uncomplicated    Clinical Decision Making Low    Rehab Potential Good    PT Frequency 2x / week    PT Duration 6 weeks    PT Treatment/Interventions ADLs/Self Care Home Management;Cryotherapy;Electrical Stimulation;DME Instruction;Ultrasound;Traction;Moist Heat;Iontophoresis 4mg /ml Dexamethasone;Functional mobility training;Therapeutic activities;Therapeutic exercise;Patient/family education;Neuromuscular re-education;Manual techniques;Taping;Dry needling;Passive range of motion;Spinal Manipulations;Joint Manipulations    PT Next Visit Plan Continue with Thera-ex, manuel therapy/dry needling    PT Home Exercise Plan [email or web address]    Consulted and Agree with Plan of Care Patient             Patient will benefit from skilled therapeutic intervention in order to improve the following deficits and impairments:  Pain, Improper body mechanics, Decreased mobility, Increased muscle spasms, Decreased strength, Decreased range of motion, Decreased activity tolerance  Visit Diagnosis: Other muscle spasm  Abnormal posture     Problem List Patient Active Problem List   Diagnosis Date Noted   Pelvic pain 07/12/2020   Gastroesophageal reflux disease without esophagitis 07/12/2020   Constipation 02/19/2020   Generalized abdominal pain 02/19/2020   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  [facility] [address] [place], [place], [address] Phone: [phone]   Fax:  [phone]  Name: Monica Steele MRN: [id number] Date of  Birth: 04-13-1985

## 2020-10-04 ENCOUNTER — Ambulatory Visit (HOSPITAL_BASED_OUTPATIENT_CLINIC_OR_DEPARTMENT_OTHER): Payer: 59 | Admitting: Physical Therapy

## 2020-10-06 ENCOUNTER — Ambulatory Visit (HOSPITAL_BASED_OUTPATIENT_CLINIC_OR_DEPARTMENT_OTHER): Payer: 59 | Admitting: Physical Therapy

## 2020-10-08 NOTE — Progress Notes (Signed)
Cardiology Office Note:    Date:  10/11/2020   ID:  Monica Steele, DOB March 27, 1985, MRN [id number]  PCP:  Maximiano Coss, NP  Cardiologist:  None  Electrophysiologist:  None   Referring MD: Maximiano Coss, NP   Chief Complaint  Patient presents with   Palpitations     History of Present Illness:    Monica Steele is a 35 y.o. female with a hx of GERD who is referred by Maximiano Coss, NP, for an evaluation of palpitations. She is 5-[redacted] weeks pregnant. She noticed she had palpitations before her pregnancy. When she is fatigued from overly working, she experiences these episodes for about 15 minutes and it feels like her heart is racing. She also have brief chest pains in the middle of her chest that occurs with no exertion. Her chest pains feels like its burning. She has shortness of breath with exertion and occasional lightheadedness. Denies any LE edema or syncope. She dose not smoke cigarettes. No family history.     Past Medical History:  Diagnosis Date   Allergy    Dry eyes    Gallbladder polyp    GERD (gastroesophageal reflux disease)    H/O seasonal allergies    Headache     Past Surgical History:  Procedure Laterality Date   COLONOSCOPY  2021   ESOPHAGOGASTRODUODENOSCOPY ENDOSCOPY  2021    Current Medications: No outpatient medications have been marked as taking for the 10/11/20 encounter (Office Visit) with Donato Heinz, MD.     Allergies:   Gadolinium derivatives   Social History   Socioeconomic History   Marital status: Married    Spouse name: Not on file   Number of children: 1   Years of education: Not on file   Highest education level: Not on file  Occupational History   Not on file  Tobacco Use   Smoking status: Never   Smokeless tobacco: Never  Vaping Use   Vaping Use: Never used  Substance and Sexual Activity   Alcohol use: No   Drug use: No   Sexual activity: Yes    Birth control/protection: None  Other Topics  Concern   Not on file  Social History Narrative   Lives home with husband, Monica Steele, and one child.  She works as Engineer, civil (consulting) in [place], [place].  Education 8th grade.  Caffeine one [employer] daily.   Social Determinants of Health   Financial Resource Strain: Not on file  Food Insecurity: Not on file  Transportation Needs: Not on file  Physical Activity: Not on file  Stress: Not on file  Social Connections: Not on file     Family History: The patient's family history includes Healthy in her father and mother. There is no history of Colon cancer, Esophageal cancer, Prostate cancer, or Rectal cancer.  ROS:   Please see the history of present illness.    (+) palpitations (+) lightheadedness  (+) chest pains, located middle of chest (+) fatigue  All other systems reviewed and are negative.  EKGs/Labs/Other Studies Reviewed:     No prior CV studies available.   EKG:  07/22: NSR, rate 84, no ST abnormalities  Recent Labs: 06/30/2020: ALT 12; BUN 8; Creatinine, Ser 0.62; Hemoglobin 14.2; Platelets 356.0; Potassium 3.6; Sodium 140; TSH 0.98  Recent Lipid Panel    Component Value Date/Time   CHOL 157 12/23/2019 1208   TRIG 96 12/23/2019 1208   HDL 41 12/23/2019 1208   CHOLHDL 3.8 12/23/2019  [place] 98 12/23/2019 1208    Physical Exam:    VS:  BP 116/68   Pulse 84   Resp 18   Ht '5\' 1"'$  (1.549 m)   Wt 147 lb (66.7 kg)   SpO2 97%   BMI 27.78 kg/m     Wt Readings from Last 3 Encounters:  10/11/20 147 lb (66.7 kg)  08/12/20 150 lb (68 kg)  08/12/20 164 lb 3.2 oz (74.5 kg)     GEN:  Well nourished, well developed in no acute distress HEENT: Normal NECK: No JVD; No carotid bruits LYMPHATICS: No lymphadenopathy CARDIAC: RRR, no murmurs, rubs, gallops RESPIRATORY:  Clear to auscultation without rales, wheezing or rhonchi  ABDOMEN: Soft, non-tender, non-distended MUSCULOSKELETAL:  No edema; No deformity  SKIN: Warm and dry NEUROLOGIC:  Alert and oriented x  3 PSYCHIATRIC:  Normal affect   ASSESSMENT:    1. Palpitations   2. Shortness of breath   3. Chest pain of uncertain etiology    PLAN:    Palpitations: Description concerning for arrhythmia, will evaluate with Zio patch x14 days  Dyspnea: Reports has been short of breath with exertion.  Will check echocardiogram to rule out structural heart disease.  Chest pain: Atypical in description, describes burning sensation in center of chest, unrelated to exertion.  Suspect likely GERD.  Given age and lack of risk factors, no further cardiac work-up recommended at this time.   RTC in 4 months   Medication Adjustments/Labs and Tests Ordered: Current medicines are reviewed at length with the patient today.  Concerns regarding medicines are outlined above.  Orders Placed This Encounter  Procedures   LONG TERM MONITOR (3-14 DAYS)   EKG 12-Lead   ECHOCARDIOGRAM COMPLETE    No orders of the defined types were placed in this encounter.   Patient Instructions  Medication Instructions:  Your physician recommends that you continue on your current medications as directed. Please refer to the Current Medication list given to you today.  Testing/Procedures: Your physician has requested that you have an echocardiogram. Echocardiography is a painless test that uses sound waves to create images of your heart. It provides your doctor with information about the size and shape of your heart and how well your heart's chambers and valves are working. This procedure takes approximately one hour. There are no restrictions for this procedure. This will be done at our [facility] location:  [place] has requested you wear a ZIO patch monitor for _14__ days.  This is a single patch monitor.   [employer] supplies one patch monitor per enrollment. Additional stickers are not available. Please do not apply patch if you will be having  a Nuclear Stress Test, Echocardiogram, Cardiac CT, MRI, or Chest Xray during the period you would be wearing the monitor. The patch cannot be worn during these tests. You cannot remove and re-apply the ZIO XT patch monitor.  Your ZIO patch monitor will be sent Fed Ex from [employer] directly to your home address. It may take 3-5 days to receive your monitor after you have been enrolled.  Once you have received your monitor, please review the enclosed instructions. Your monitor has already been registered assigning a specific monitor serial # to you.  Billing and Patient Assistance Program Information   We have supplied [employer] with any of your insurance information on file for billing purposes. [employer] offers a sliding  scale Patient Assistance Program for patients that do not have insurance, or whose insurance does not completely cover the cost of the ZIO monitor.   You must apply for the Patient Assistance Program to qualify for this discounted rate.     To apply, please call [employer] at [phone], select option 4, then select option 2, and ask to apply for Patient Assistance Program.  Theodore Demark will ask your household income, and how many people are in your household.  They will quote your out-of-pocket cost based on that information.  [employer] will also be able to set up a 20-month interest-free payment plan if needed.  Applying the monitor   Shave hair from upper left chest.  Hold abrader disc by orange tab. Rub abrader in 40 strokes over the upper left chest as indicated in your monitor instructions.  Clean area with 4 enclosed alcohol pads. Let dry.  Apply patch as indicated in monitor instructions. Patch will be placed under collarbone on left side of chest with arrow pointing upward.  Rub patch adhesive wings for 2 minutes. Remove white label marked "1". Remove the white label marked "2". Rub patch adhesive wings for 2 additional minutes.  While looking in a mirror, press and  release button in center of patch. A small green light will flash 3-4 times. This will be your only indicator that the monitor has been turned on. ?  Do not shower for the first 24 hours. You may shower after the first 24 hours.  Press the button if you feel a symptom. You will hear a small click. Record Date, Time and Symptom in the Patient Logbook.  When you are ready to remove the patch, follow instructions on the last 2 pages of the Patient Logbook. Stick patch monitor onto the last page of Patient Logbook.  Place Patient Logbook in the blue and white box.  Use locking tab on box and tape box closed securely.  The blue and white box has prepaid postage on it. Please place it in the mailbox as soon as possible. Your physician should have your test results approximately 7 days after the monitor has been mailed back to [facility]  Call [facility] [phone] you have questions regarding your ZIO XT patch monitor. Call them immediately if you see an orange light blinking on your monitor.  If your monitor falls off in less than 4 days, contact our Monitor department at [phone] ?If your monitor becomes loose or falls off after 4 days call [employer] at [phone] suggestions on securing your monitor.?  Follow-Up: At [facility] you and your health needs are our priority.  As part of our continuing mission to provide you with exceptional heart care, we have created designated Provider Care Teams.  These Care Teams include your primary Cardiologist (physician) and Advanced Practice Providers (APPs -  Physician Assistants and Nurse Practitioners) who all work together to provide you with the care you need, when you need it.  We recommend signing up for the patient portal called "MyChart".  Sign up information is provided on this After Visit Summary.  MyChart is used to connect with patients for Virtual Visits (Telemedicine).  Patients are able to view lab/test  results, encounter notes, upcoming appointments, etc.  Non-urgent messages can be sent to your provider as well.   To learn more about what you can do with MyChart, go to [email or web address]    Your next appointment:   4 month(s)  The format for  your next appointment:   In Person  Provider:   Oswaldo Milian, MD      Ardell Isaacs as a scribe for Donato Heinz, MD.,have documented all relevant documentation on the behalf of Donato Heinz, MD,as directed by  Donato Heinz, MD while in the presence of Donato Heinz, MD.  I, Donato Heinz, MD, have reviewed all documentation for this visit. The documentation on 10/11/20 for the exam, diagnosis, procedures, and orders are all accurate and complete.   Signed, Donato Heinz, MD  10/11/2020 10:19 AM    [place]

## 2020-10-11 ENCOUNTER — Other Ambulatory Visit: Payer: Self-pay

## 2020-10-11 ENCOUNTER — Encounter: Payer: Self-pay | Admitting: Cardiology

## 2020-10-11 ENCOUNTER — Ambulatory Visit (INDEPENDENT_AMBULATORY_CARE_PROVIDER_SITE_OTHER): Payer: 59 | Admitting: Cardiology

## 2020-10-11 ENCOUNTER — Ambulatory Visit (INDEPENDENT_AMBULATORY_CARE_PROVIDER_SITE_OTHER): Payer: 59

## 2020-10-11 VITALS — BP 116/68 | HR 84 | Resp 18 | Ht 61.0 in | Wt 147.0 lb

## 2020-10-11 DIAGNOSIS — R0602 Shortness of breath: Secondary | ICD-10-CM

## 2020-10-11 DIAGNOSIS — R002 Palpitations: Secondary | ICD-10-CM | POA: Diagnosis not present

## 2020-10-11 DIAGNOSIS — R079 Chest pain, unspecified: Secondary | ICD-10-CM | POA: Diagnosis not present

## 2020-10-11 NOTE — Progress Notes (Unsigned)
Patient enrolled for [employer] to ship a 14 day long term holter monitor to address on file.  Her [employer] insurance is not in network with [employer]/ ZIO.

## 2020-10-11 NOTE — Patient Instructions (Signed)
Medication Instructions:  Your physician recommends that you continue on your current medications as directed. Please refer to the Current Medication list given to you today.  Testing/Procedures: Your physician has requested that you have an echocardiogram. Echocardiography is a painless test that uses sound waves to create images of your heart. It provides your doctor with information about the size and shape of your heart and how well your heart's chambers and valves are working. This procedure takes approximately one hour. There are no restrictions for this procedure. This will be done at our [facility] location:  [place] has requested you wear a ZIO patch monitor for _14__ days.  This is a single patch monitor.   [employer] supplies one patch monitor per enrollment. Additional stickers are not available. Please do not apply patch if you will be having a Nuclear Stress Test, Echocardiogram, Cardiac CT, MRI, or Chest Xray during the period you would be wearing the monitor. The patch cannot be worn during these tests. You cannot remove and re-apply the ZIO XT patch monitor.  Your ZIO patch monitor will be sent Fed Ex from [employer] directly to your home address. It may take 3-5 days to receive your monitor after you have been enrolled.  Once you have received your monitor, please review the enclosed instructions. Your monitor has already been registered assigning a specific monitor serial # to you.  Billing and Patient Assistance Program Information   We have supplied [employer] with any of your insurance information on file for billing purposes. [employer] offers a sliding scale Patient Assistance Program for patients that do not have insurance, or whose insurance does not completely cover the cost of the ZIO monitor.   You must apply for the Patient Assistance Program to qualify for this discounted rate.     To  apply, please call [employer] at [phone], select option 4, then select option 2, and ask to apply for Patient Assistance Program.  Theodore Demark will ask your household income, and how many people are in your household.  They will quote your out-of-pocket cost based on that information.  [employer] will also be able to set up a 58-month interest-free payment plan if needed.  Applying the monitor   Shave hair from upper left chest.  Hold abrader disc by orange tab. Rub abrader in 40 strokes over the upper left chest as indicated in your monitor instructions.  Clean area with 4 enclosed alcohol pads. Let dry.  Apply patch as indicated in monitor instructions. Patch will be placed under collarbone on left side of chest with arrow pointing upward.  Rub patch adhesive wings for 2 minutes. Remove white label marked "1". Remove the white label marked "2". Rub patch adhesive wings for 2 additional minutes.  While looking in a mirror, press and release button in center of patch. A small green light will flash 3-4 times. This will be your only indicator that the monitor has been turned on. ?  Do not shower for the first 24 hours. You may shower after the first 24 hours.  Press the button if you feel a symptom. You will hear a small click. Record Date, Time and Symptom in the Patient Logbook.  When you are ready to remove the patch, follow instructions on the last 2 pages of the Patient Logbook. Stick patch monitor onto the last page of Patient Logbook.  Place Patient Logbook in the blue  and white box.  Use locking tab on box and tape box closed securely.  The blue and white box has prepaid postage on it. Please place it in the mailbox as soon as possible. Your physician should have your test results approximately 7 days after the monitor has been mailed back to [facility].  Call [place] at [phone] if you have questions regarding your ZIO XT patch monitor. Call them immediately if you see  an orange light blinking on your monitor.  If your monitor falls off in less than 4 days, contact our Monitor department at [phone]. ?If your monitor becomes loose or falls off after 4 days call [employer] at [phone] for suggestions on securing your monitor.?  Follow-Up: At [facility], you and your health needs are our priority.  As part of our continuing mission to provide you with exceptional heart care, we have created designated Provider Care Teams.  These Care Teams include your primary Cardiologist (physician) and Advanced Practice Providers (APPs -  Physician Assistants and Nurse Practitioners) who all work together to provide you with the care you need, when you need it.  We recommend signing up for the patient portal called "MyChart".  Sign up information is provided on this After Visit Summary.  MyChart is used to connect with patients for Virtual Visits (Telemedicine).  Patients are able to view lab/test results, encounter notes, upcoming appointments, etc.  Non-urgent messages can be sent to your provider as well.   To learn more about what you can do with MyChart, go to [email or web address].    Your next appointment:   4 month(s)  The format for your next appointment:   In Person  Provider:   Oswaldo Milian, MD

## 2020-10-12 ENCOUNTER — Encounter (HOSPITAL_BASED_OUTPATIENT_CLINIC_OR_DEPARTMENT_OTHER): Payer: 59 | Admitting: Physical Therapy

## 2020-10-12 ENCOUNTER — Ambulatory Visit: Payer: 59 | Admitting: Gastroenterology

## 2020-10-15 DIAGNOSIS — R002 Palpitations: Secondary | ICD-10-CM

## 2020-10-18 ENCOUNTER — Encounter (HOSPITAL_BASED_OUTPATIENT_CLINIC_OR_DEPARTMENT_OTHER): Payer: 59 | Admitting: Physical Therapy

## 2020-10-20 ENCOUNTER — Encounter (HOSPITAL_BASED_OUTPATIENT_CLINIC_OR_DEPARTMENT_OTHER): Payer: 59 | Admitting: Physical Therapy

## 2020-10-25 ENCOUNTER — Encounter (HOSPITAL_BASED_OUTPATIENT_CLINIC_OR_DEPARTMENT_OTHER): Payer: 59 | Admitting: Physical Therapy

## 2020-10-26 ENCOUNTER — Other Ambulatory Visit: Payer: Self-pay

## 2020-10-26 ENCOUNTER — Ambulatory Visit (HOSPITAL_COMMUNITY): Payer: 59 | Attending: Internal Medicine

## 2020-10-26 DIAGNOSIS — R0602 Shortness of breath: Secondary | ICD-10-CM | POA: Diagnosis present

## 2020-10-26 LAB — ECHOCARDIOGRAM COMPLETE: S' Lateral: 2.8 cm

## 2020-11-02 ENCOUNTER — Encounter: Payer: Self-pay | Admitting: *Deleted

## 2020-11-21 ENCOUNTER — Other Ambulatory Visit: Payer: Self-pay

## 2020-11-24 ENCOUNTER — Encounter: Payer: Self-pay | Admitting: *Deleted

## 2020-12-04 ENCOUNTER — Other Ambulatory Visit: Payer: Self-pay

## 2020-12-14 LAB — OB RESULTS CONSOLE ANTIBODY SCREEN: Antibody Screen: NEGATIVE

## 2020-12-14 LAB — OB RESULTS CONSOLE ABO/RH: RH Type: POSITIVE

## 2020-12-14 LAB — OB RESULTS CONSOLE GC/CHLAMYDIA
Chlamydia: NEGATIVE
Gonorrhea: NEGATIVE

## 2020-12-14 LAB — OB RESULTS CONSOLE RUBELLA ANTIBODY, IGM: Rubella: IMMUNE

## 2020-12-14 LAB — HEPATITIS C ANTIBODY: HCV Ab: NEGATIVE

## 2020-12-14 LAB — OB RESULTS CONSOLE HIV ANTIBODY (ROUTINE TESTING): HIV: NONREACTIVE

## 2020-12-14 LAB — OB RESULTS CONSOLE RPR: RPR: NONREACTIVE

## 2020-12-14 LAB — OB RESULTS CONSOLE HEPATITIS B SURFACE ANTIGEN: Hepatitis B Surface Ag: NEGATIVE

## 2020-12-19 ENCOUNTER — Telehealth: Payer: 59

## 2021-01-09 ENCOUNTER — Ambulatory Visit
Admission: RE | Admit: 2021-01-09 | Discharge: 2021-01-09 | Disposition: A | Discharge status: Home / Self Care | Payer: 59 | Source: Ambulatory Visit | Attending: Internal Medicine | Admitting: Internal Medicine

## 2021-01-09 ENCOUNTER — Other Ambulatory Visit: Payer: Self-pay

## 2021-01-09 VITALS — BP 114/68 | HR 80 | Temp 98.2°F | Resp 16

## 2021-01-09 DIAGNOSIS — Z113 Encounter for screening for infections with a predominantly sexual mode of transmission: Secondary | ICD-10-CM

## 2021-01-09 DIAGNOSIS — N898 Other specified noninflammatory disorders of vagina: Secondary | ICD-10-CM | POA: Diagnosis not present

## 2021-01-09 NOTE — ED Triage Notes (Addendum)
19-weeks pregnant. Started having white, occasionally yellow and cloudy vaginal discharge with a lot of itching in the area starting two weeks ago. Reports has happened before, but never this much, is having to wear a pad to help. Denies abdominal pain, vaginal bleeding

## 2021-01-09 NOTE — Discharge Instructions (Signed)
Your vaginal swab is pending.  We will call with results and treat as appropriate.

## 2021-01-09 NOTE — ED Provider Notes (Signed)
[name]    CSN: [id number] Arrival date & time: 01/09/21  1110      History   Chief Complaint Chief Complaint  Patient presents with   Vaginal Discharge    HPI Monica Steele is a 35 y.o. female.   Patient presents with white to yellow vaginal discharge that started approximately 2 weeks ago.  Patient denies any urinary burning, urinary frequency, pelvic pain, regular vaginal bleeding, back pain, fever.  Denies any known exposure to STD but has had unprotected sexual intercourse recently.  Patient reports that she is [redacted] weeks pregnant as well.   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 Authorizing Provider  fexofenadine (ALLEGRA ALLERGY) 180 MG tablet Take 1 tablet (180 mg total) by mouth daily. 03/10/18 09/01/18  Rodriguez-Southworth, Sunday Spillers, PA-C    Family History Family History  Problem Relation Age of Onset   Healthy Mother    Healthy Father    Colon cancer Neg Hx    Esophageal cancer Neg Hx    Prostate cancer Neg Hx     Rectal cancer Neg Hx     Social History Social History   Tobacco Use   Smoking status: Never   Smokeless tobacco: Never  Vaping Use   Vaping Use: Never used  Substance Use Topics   Alcohol use: No   Drug use: No     Allergies   Gadolinium derivatives   Review of Systems Review of Systems Per HPI  Physical Exam Triage Vital Signs ED Triage Vitals  Enc Vitals Group     BP 01/09/21 1204 114/68     Pulse Rate 01/09/21 1204 80     Resp 01/09/21 1204 16     Temp 01/09/21 1204 98.2 F (36.8 C)     Temp Source 01/09/21 1204 Oral     SpO2 01/09/21 1204 97 %     Weight --      Height --      Head Circumference --      Peak Flow --      Pain Score 01/09/21 1206 0     Pain Loc --      Pain Edu? --      Excl. in [place]? --    No data found.  Updated Vital Signs BP 114/68 (BP Location: Right Arm)   Pulse 80   Temp 98.2 F (36.8 C) (Oral)   Resp 16   LMP 07/27/2020   SpO2 97%   Visual Acuity Right Eye Distance:   Left Eye Distance:   Bilateral Distance:    Right Eye Near:   Left Eye Near:    Bilateral Near:     Physical Exam Constitutional:      General: She is not in acute distress.    Appearance: Normal appearance. She is not toxic-appearing or diaphoretic.  HENT:     Head: Normocephalic and atraumatic.  Eyes:     Extraocular Movements: Extraocular movements intact.     Conjunctiva/sclera: Conjunctivae normal.  Pulmonary:     Effort: Pulmonary effort is normal.  Genitourinary:    Comments: Deferred with shared decision making.  Self swab performed. Neurological:     General: No focal deficit present.     Mental Status: She is alert and oriented to person, place, and time. Mental status is at baseline.  Psychiatric:        Mood and Affect: Mood normal.        Behavior: Behavior normal.        Thought Content: Thought content normal.        Judgment: Judgment normal.     UC Treatments / Results  Labs (all labs ordered are listed, but only  abnormal results are displayed) Labs Reviewed  CERVICOVAGINAL ANCILLARY ONLY    EKG   Radiology No results found.  Procedures Procedures (including critical care time)  Medications Ordered in UC Medications - No data to display  Initial Impression / Assessment and Plan / UC Course  I have reviewed the triage vital signs and the nursing notes.  Pertinent labs & imaging results that were available during my care of the patient were reviewed by me and considered in my medical decision making (see chart for details).     Will await cervicovaginal swab results for treatment.  No need for urinalysis as patient does not have any urinary symptoms.  Advised patient to notify OB/GYN of current symptoms.  No red flags seen on exam.Discussed strict return precautions. Patient verbalized understanding and is agreeable with plan.  Interpreter used throughout patient interaction. Final Clinical Impressions(s) / UC Diagnoses   Final diagnoses:  Screening examination for venereal disease  Vaginal discharge     Discharge Instructions      Your vaginal swab is pending.  We will call with results and treat as appropriate.     ED Prescriptions   None    PDMP not reviewed this encounter.   Teodora Medici, [place] 01/09/21 1329

## 2021-01-10 LAB — CERVICOVAGINAL ANCILLARY ONLY
Bacterial Vaginitis (gardnerella): POSITIVE — AB
Candida Glabrata: NEGATIVE
Candida Vaginitis: POSITIVE — AB
Chlamydia: NEGATIVE
Comment: NEGATIVE
Comment: NEGATIVE
Comment: NEGATIVE
Comment: NEGATIVE
Comment: NEGATIVE
Comment: NORMAL
Neisseria Gonorrhea: NEGATIVE
Trichomonas: NEGATIVE

## 2021-01-11 ENCOUNTER — Other Ambulatory Visit: Payer: Self-pay

## 2021-01-13 ENCOUNTER — Telehealth (HOSPITAL_COMMUNITY): Payer: Self-pay | Admitting: Emergency Medicine

## 2021-01-13 MED ORDER — METRONIDAZOLE 0.75 % VA GEL: 1.0000 | Freq: Every day | VAGINAL | 0 refills | Status: AC

## 2021-01-13 MED ORDER — TERCONAZOLE 0.4 % VA CREA: 1.0000 | TOPICAL_CREAM | Freq: Every day | VAGINAL | 0 refills | Status: AC

## 2021-02-15 ENCOUNTER — Other Ambulatory Visit: Payer: Self-pay | Admitting: Obstetrics and Gynecology

## 2021-02-15 DIAGNOSIS — Z3689 Encounter for other specified antenatal screening: Secondary | ICD-10-CM

## 2021-03-03 ENCOUNTER — Encounter: Payer: Self-pay | Admitting: *Deleted

## 2021-03-08 ENCOUNTER — Ambulatory Visit: Payer: 59 | Admitting: *Deleted

## 2021-03-08 ENCOUNTER — Ambulatory Visit (HOSPITAL_BASED_OUTPATIENT_CLINIC_OR_DEPARTMENT_OTHER): Payer: 59 | Admitting: Obstetrics and Gynecology

## 2021-03-08 ENCOUNTER — Ambulatory Visit: Payer: 59

## 2021-03-08 ENCOUNTER — Other Ambulatory Visit: Payer: Self-pay | Admitting: *Deleted

## 2021-03-08 ENCOUNTER — Ambulatory Visit: Payer: 59 | Attending: Obstetrics and Gynecology

## 2021-03-08 ENCOUNTER — Encounter: Payer: Self-pay | Admitting: *Deleted

## 2021-03-08 ENCOUNTER — Other Ambulatory Visit: Payer: Self-pay

## 2021-03-08 VITALS — BP 109/55 | HR 81

## 2021-03-08 DIAGNOSIS — O35DXX0 Maternal care for other (suspected) fetal abnormality and damage, fetal gastrointestinal anomalies, not applicable or unspecified: Secondary | ICD-10-CM | POA: Diagnosis not present

## 2021-03-08 DIAGNOSIS — Z3689 Encounter for other specified antenatal screening: Secondary | ICD-10-CM | POA: Diagnosis not present

## 2021-03-08 DIAGNOSIS — Z3A27 27 weeks gestation of pregnancy: Secondary | ICD-10-CM

## 2021-03-08 DIAGNOSIS — O09522 Supervision of elderly multigravida, second trimester: Secondary | ICD-10-CM

## 2021-03-08 NOTE — Progress Notes (Signed)
Maternal-Fetal Medicine   Name: Monica Steele DOB: [birth year] MRN: [id number] Referring Provider: Donnel Saxon, CNM  I had the pleasure of seeing Monica Steele today at the [facility]. She is G3 P1011 at 27w 1d gestation and is here for ultrasound evaluation of her pregnancy and consultation. I obtained history and counseled her with help of language interpreter present in the room.  At your office ultrasound, echogenic bowel, small head circumference measurement and low-lying placenta were suspected.  Patient is here to screen for gestational diabetes.  Her blood pressures have been normal at prenatal visits.    Obstetrical history is significant for a term vaginal delivery in 2014 of a female infant weighing 7 pounds and 2 ounces at birth.  Her pregnancy and delivery were uncomplicated. Past medical history: No history of diabetes or hypertension or any chronic medical conditions. Allergies: No known drug allergies.  Prenatal course: On cell free fetal DNA screening, the risks of fetal aneuploidies are not increased.  MSAFP screening showed low risk for open neural tube defects.  Carrier screening including cystic fibrosis was negative. Patient does not give history of vaginal bleeding in this pregnancy.  Ultrasound We performed a fetal anatomical survey.  Amniotic fluid is normal and good fetal activity seen.  Fetal growth is appropriate for gestational age.  Head circumference measurement is at between mean and -1 SD. Echogenic bowel is seen.  No other markers of aneuploidies or obvious fetal structural defects are seen.  Fetal anatomical survey appears normal but limited by advanced gestational age. Placenta is posterior and there is no evidence of low-lying placenta.  Our concerns include Echogenic bowel  -I discussed the finding of echogenic bowel seen on today's ultrasound. Echogenic bowel can be associated with  a) Fetal aneuploidies (most commonly, Down syndrome) are  seen in 3% to 5% of fetuses with echogenic bowel.  Given that cell free fetal DNA screening did not show increased risk for Down syndrome, this finding is not a marker for Down syndrome. B) Fetal growth restriction.  I reassured the patient of normal fetal growth assessment.   C) Fetal infection is only rarely associated with this finding. I counseled her on screening for CMV and toxoplasmosis. Patient does not have a history of fever or rashes. CMV infection is unlikely. I recommended serology for CMV and toxoplasmosis. Patient agreed to be screened. d) Cystic fibrosis (CF). Carrier screening is negative for cystic fibrosis and I reassured her. e) Normal fetus.   I reassured the patient that in the absence of above-mentioned causes, we should expect good pregnancy outcomes. I recommend fetal growth assessments to rule out fetal growth restriction. Recommendations: -An appointment was made for her to return in 5 weeks for fetal growth assessment. -CMV, toxoplasmosis screening. Patient went to [facility] and had her blood drawn.  Thank you for consultation.  If you have any questions or concerns, please contact me the [facility].  Consultation including face-to-face (more than 50%) counseling 30 minutes.

## 2021-03-09 ENCOUNTER — Telehealth: Payer: Self-pay | Admitting: Obstetrics and Gynecology

## 2021-03-09 LAB — CMV IGM: CMV IgM Ser EIA-aCnc: <30 AU/mL (ref 0.0–29.9)

## 2021-03-09 LAB — TOXOPLASMA GONDII ANTIBODY, IGG: Toxoplasma IgG Ratio: <3 IU/mL (ref 0.0–7.1)

## 2021-03-09 LAB — CMV ANTIBODY, IGG (EIA): CMV Ab - IgG: 4 U/mL — ABNORMAL HIGH (ref 0.00–0.59)

## 2021-03-09 LAB — TOXOPLASMA GONDII ANTIBODY, IGM: Toxoplasma Antibody- IgM: <3 AU/mL (ref 0.0–7.9)

## 2021-03-09 LAB — INFECT DISEASE AB IGM REFLEX 1

## 2021-03-09 NOTE — Telephone Encounter (Signed)
I called the patient to discuss her serology results. Spoke with help of [place] interpreter Agricultural engineer).  No evidence of CMV or toxoplasmosis infection that could account for echogenic bowel seen on ultrasound.

## 2021-03-20 NOTE — L&D Delivery Note (Addendum)
Delivery Note ?Labor onset:   Labor Onset Time: 1400 ?Complete dilation at 7:41 PM  ?Onset of pushing at 1941 ?FHR second stage Cat 1 ?Analgesia/Anesthesia intrapartum: Epidural ? ? ?CNM walked into room and baby was birthed spontanously without any maternal effort. Delivery of a viable female at 98. Nuchal cord: none.  ?Infant placed on maternal abd, dried, and tactile stim.  ?Cord double clamped after 1 min and cut by Father.  ?RN x3 present for birth. ? ?Cord blood sample collected: Yes ?Arterial cord blood sample collected: No ? ?Placenta delivered Monica Steele, intact, with 3 VC.  ?Placenta to L&D. ?Uterine tone form, bleeding small, no clots ? ?1st degree laceration identified, hemostatic, no repair required.  ?Anesthesia: Epidural ?Repair: N/A ?QBL/EBL (mL): 250 ?Complications: none ?APGAR: APGAR (1 MIN): 9   ?APGAR (5 MINS): 9   ?APGAR (10 MINS):   ?Mom to postpartum.  Baby to Couplet care / Skin to Skin. ? ?Monica Eastern MSN, CNM ?06/02/2021, 8:00 PM ? ? ?

## 2021-03-22 ENCOUNTER — Ambulatory Visit: Payer: 59 | Admitting: Cardiology

## 2021-04-19 ENCOUNTER — Ambulatory Visit: Payer: Self-pay

## 2021-05-02 IMAGING — CT CT ABD-PELV W/ CM
2 of 4 series · 16 of 46 positions shown, 18 images · IV contrast (APPLIED)
Comparison: Abdominal ultrasound 03/05/2019

CLINICAL DATA: Periumbilical abdominal pain.

EXAM:
CT ABDOMEN AND PELVIS WITH CONTRAST
TECHNIQUE: Multidetector CT imaging of the abdomen and pelvis was performed
using the standard protocol following bolus administration of
intravenous contrast.
CONTRAST:  100mL OMNIPAQUE IOHEXOL 300 MG/ML  SOLN

[Series 2: axial st · axial · 0.93mm/px · z∈[-1056,-611]mm · 13 of 99 slices shown, 15 images]
[im 5/99  soft-tissue]
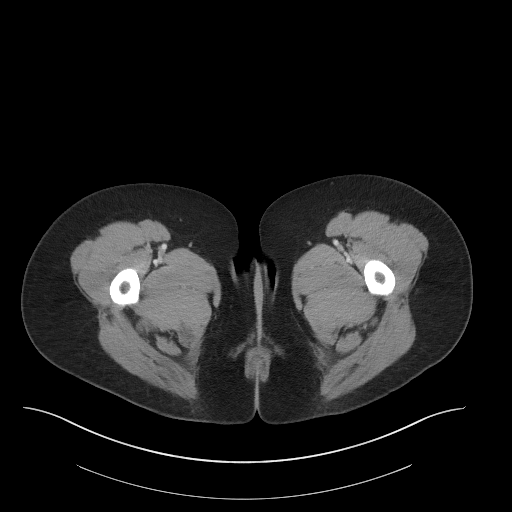
[im 5/99  bone]
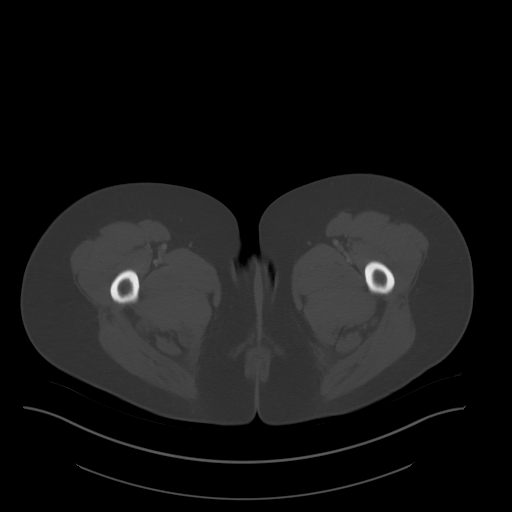
[im 14/99  soft-tissue]
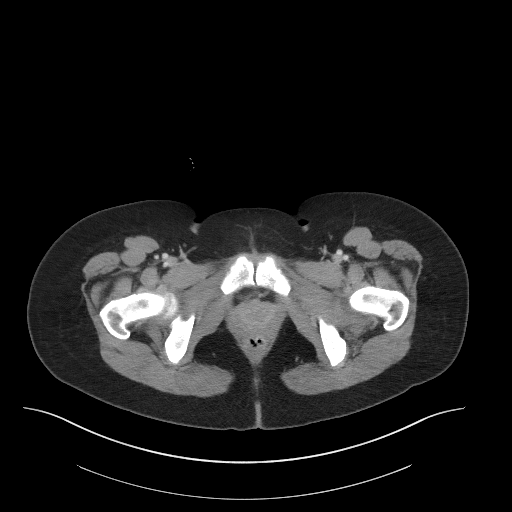
[im 23/99  soft-tissue]
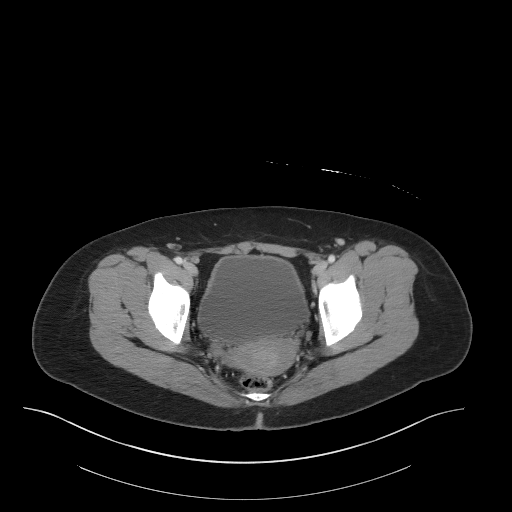
[im 27/99  soft-tissue]
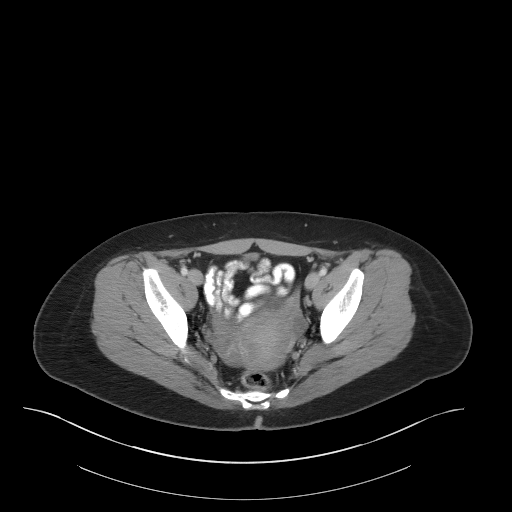
[im 36/99  soft-tissue]
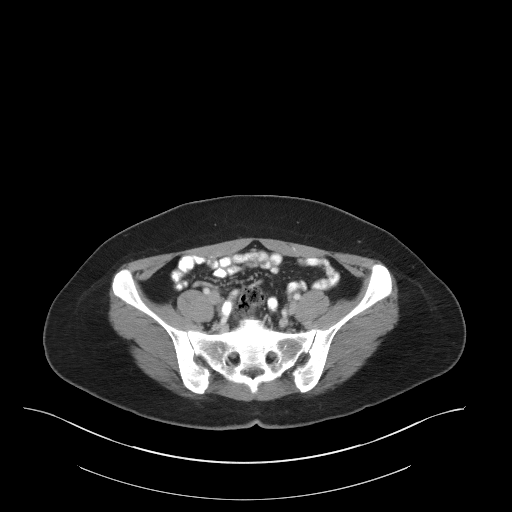
[im 41/99  soft-tissue]
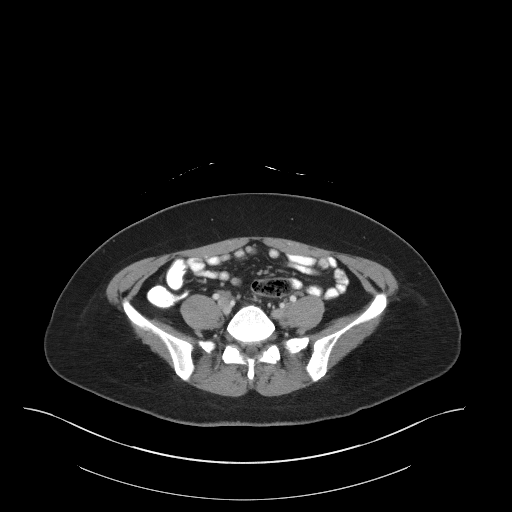
[im 49/99  soft-tissue]
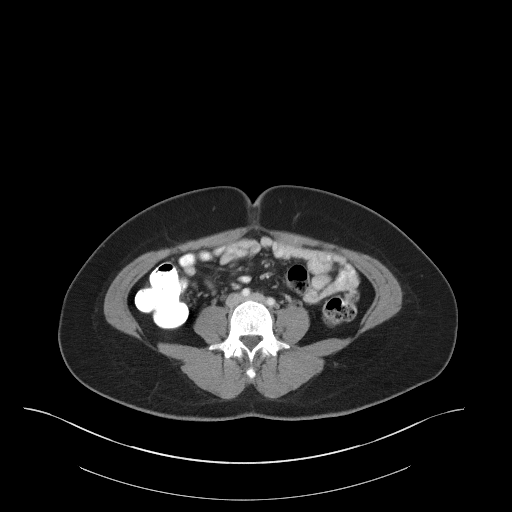
[im 58/99  soft-tissue]
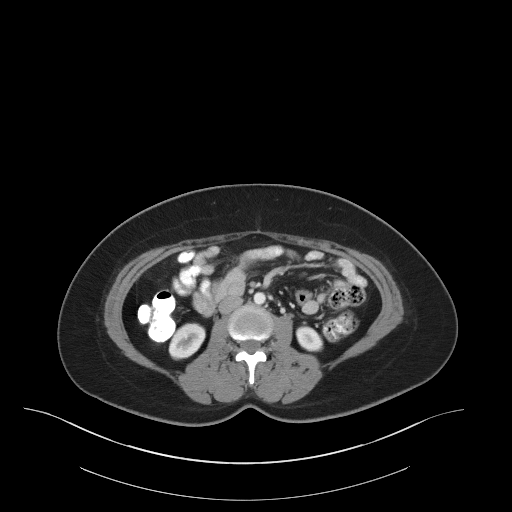
[im 63/99  soft-tissue]
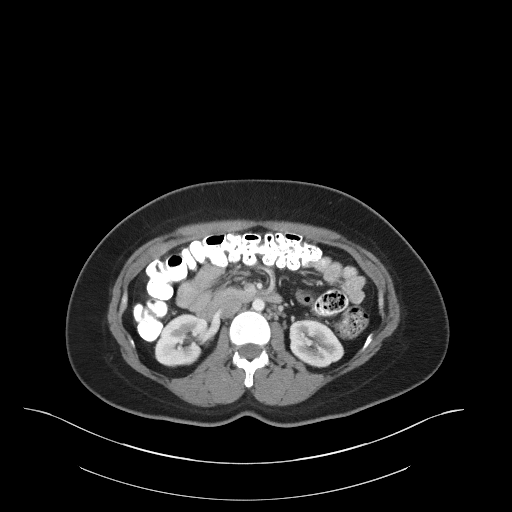
[im 63/99  bone]
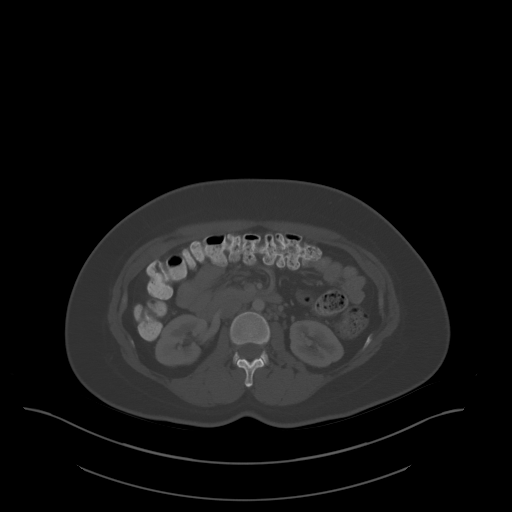
[im 72/99  soft-tissue]
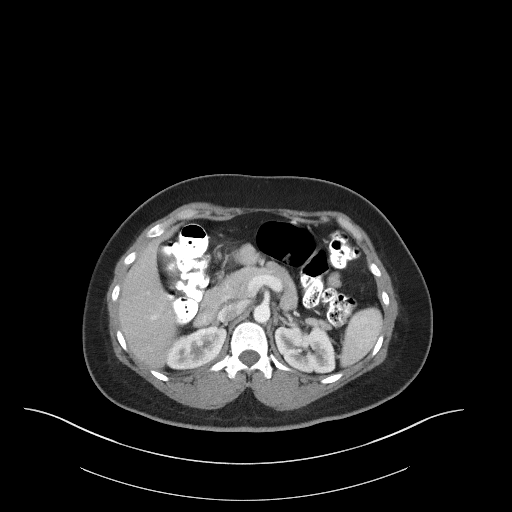
[im 76/99  soft-tissue]
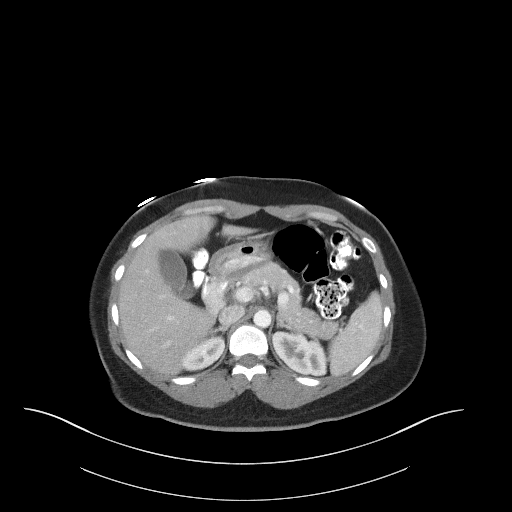
[im 85/99  soft-tissue]
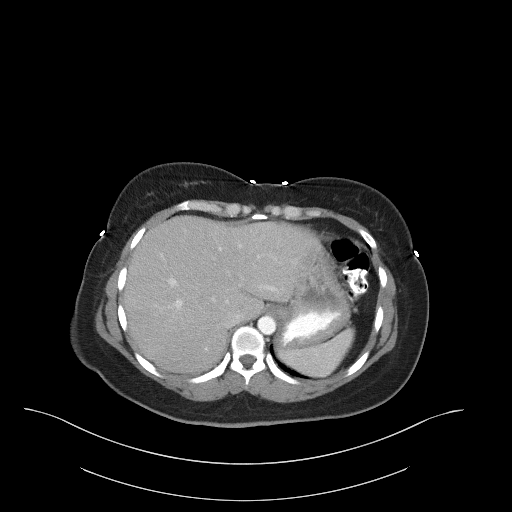
[im 94/99  soft-tissue]
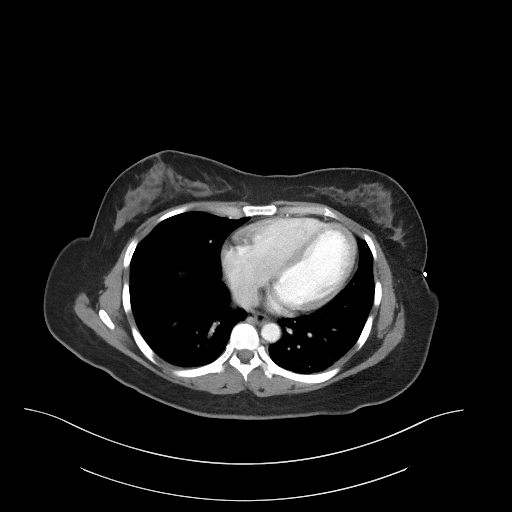

[Series 5: coronal st · coronal · 0.68mm/px · 3 of 79 slices shown]
[im 27/79  soft-tissue]
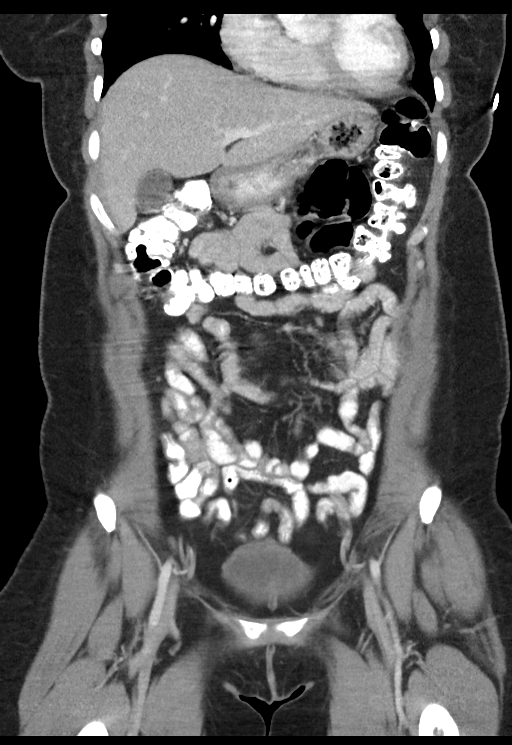
[im 35/79  soft-tissue]
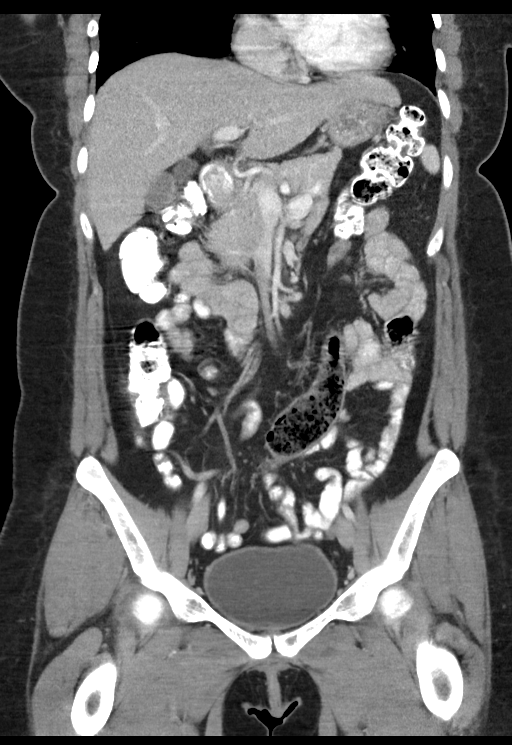
[im 44/79  soft-tissue]
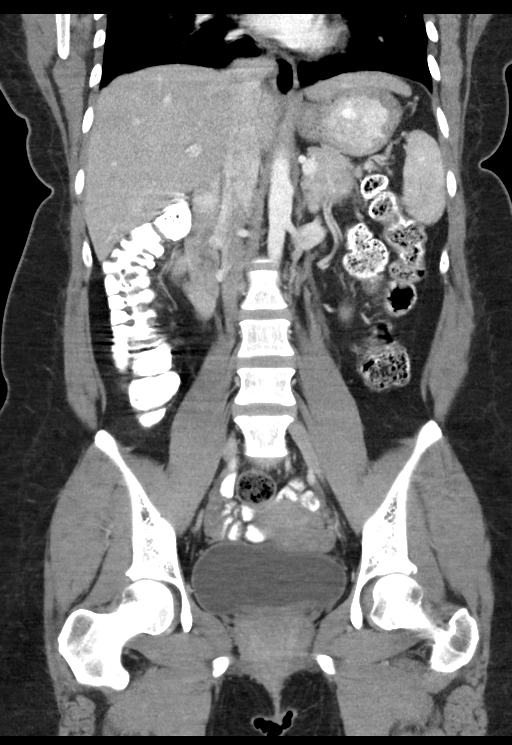

[16 of 46 positions shown; findings below may reference images not displayed]

FINDINGS: Lower chest: Borderline cardiomegaly. Enlargement of the
cardiopericardial silhouette

Hepatobiliary: The liver appears unremarkable. The small gallbladder
polyps seen on ultrasound are not readily apparent on CT. No biliary
dilatation.

Pancreas: Unremarkable

Spleen: Unremarkable

Adrenals/Urinary Tract: Unremarkable

Stomach/Bowel: Unremarkable.  Appendix normal.

Vascular/Lymphatic: Unremarkable

Reproductive: Unremarkable

Other: No supplemental non-categorized findings.

Musculoskeletal: 2 unremarkable
IMPRESSION: 1. A cause for the patient's periumbilical abdominal pain is not
identified.
2. Borderline cardiomegaly.
3. The small gallbladder polyps seen on ultrasound are not readily
apparent on CT. These do not require follow up based on prior size.

## 2021-05-10 ENCOUNTER — Encounter: Payer: Medicaid Other | Attending: Obstetrics and Gynecology | Admitting: Skilled Nursing Facility1

## 2021-05-10 ENCOUNTER — Encounter: Payer: Self-pay | Admitting: Skilled Nursing Facility1

## 2021-05-10 ENCOUNTER — Other Ambulatory Visit: Payer: Self-pay

## 2021-05-10 DIAGNOSIS — Z3A00 Weeks of gestation of pregnancy not specified: Secondary | ICD-10-CM | POA: Insufficient documentation

## 2021-05-10 DIAGNOSIS — O24419 Gestational diabetes mellitus in pregnancy, unspecified control: Secondary | ICD-10-CM | POA: Insufficient documentation

## 2021-05-10 DIAGNOSIS — Z713 Dietary counseling and surveillance: Secondary | ICD-10-CM | POA: Insufficient documentation

## 2021-05-10 DIAGNOSIS — Z362 Encounter for other antenatal screening follow-up: Secondary | ICD-10-CM | POA: Diagnosis not present

## 2021-05-10 DIAGNOSIS — Z3A36 36 weeks gestation of pregnancy: Secondary | ICD-10-CM | POA: Diagnosis not present

## 2021-05-10 LAB — OB RESULTS CONSOLE GBS: GBS: NEGATIVE

## 2021-05-10 NOTE — Progress Notes (Signed)
Patient was seen for Gestational Diabetes self-management on 05/10/2021  Start time 11:00 and End time 12:15   Estimated due date: March 20, 4 weeks to due date  Interpretor: [id number] adrian    Clinical: Medications: prenatal, vitamin D  Medical History: GERD, GDM Labs: OGTT 177  Dietary and Lifestyle History:  Pt states she has acid reflux every time she eats. Pt states she only has the reflux with certain foods: unknown which ones. Pt states she rarley eats a western type diet. Pt states she will sometimes dirnk milk in the middle of the night if feeling hungry.  Pt states she Orders from [employer] every morning: dietitian showed pt on the [employer] app how to better order her drinks with significantly less sugar and caffeine.   Due to pts note taking and active listening expect 100% compliance. Dietitian advised pt to make another appt if her blood sugars are out of range either high or low.   Physical Activity: ADL's Stress:  Sleep:   24 hr Recall: wakes between 7:30-8am First Meal 8:30 am: sub: 2 egg + butter + soy sauce  Snack:  Second meal 12: 1 cup rice + pork + vegetable soup Snack: sweet cookies Third meal: 1 cup rice + pork + vegetables  Snack: banana and pear  Beverages: 2% milk, water, bought from [employer] coffee, strawberry juice, dragon fruit juice  NUTRITION INTERVENTION  Nutrition education (E-1) on the following topics:   Initial Follow-up  [x]  []  Definition of Gestational Diabetes [x]  []  Why dietary management is important in controlling blood glucose [x]  []  Effects each nutrient has on blood glucose levels [x]  []  Simple carbohydrates vs complex carbohydrates [x]  []  Fluid intake [x]  []  Creating a balanced meal plan []  []  Carbohydrate counting  [x]  []  When to check blood glucose levels [x]  []  Proper blood glucose monitoring techniques [x]  []  Effect of stress and stress reduction techniques  [x]  []  Exercise effect on blood glucose levels, appropriate  exercise during pregnancy [x]  []  Importance of limiting caffeine and abstaining from alcohol and smoking [x]  []  Medications used for blood sugar control during pregnancy [x]  []  Hypoglycemia and rule of 15 [x]  []  Postpartum self care  Blood glucose monitor given: accu check guide me Lot # [id number] Exp: 06/06/22 CBG: 78 mg/dL   Patient instructed to monitor glucose levels: FBS: 60 - ? 95 mg/dL (some clinics use 90 for cutoff) 1 hour: ? 140 mg/dL 2 hour: ? 120 mg/dL  Patient received handouts: All materials given in [place] language  Nutrition Diabetes and Pregnancy Carbohydrate Counting List [place] myplate  Patient will be seen for follow-up as needed.

## 2021-06-01 ENCOUNTER — Other Ambulatory Visit: Payer: Self-pay | Admitting: Obstetrics and Gynecology

## 2021-06-01 ENCOUNTER — Telehealth (HOSPITAL_COMMUNITY): Payer: Self-pay | Admitting: *Deleted

## 2021-06-01 ENCOUNTER — Encounter (HOSPITAL_COMMUNITY): Payer: Self-pay | Admitting: *Deleted

## 2021-06-01 NOTE — Telephone Encounter (Signed)
Preadmission screen  

## 2021-06-01 NOTE — Telephone Encounter (Signed)
Interpreter number [phone] ?

## 2021-06-02 ENCOUNTER — Inpatient Hospital Stay (HOSPITAL_COMMUNITY): Payer: Medicaid Other | Admitting: Anesthesiology

## 2021-06-02 ENCOUNTER — Other Ambulatory Visit: Payer: Self-pay

## 2021-06-02 ENCOUNTER — Encounter (HOSPITAL_COMMUNITY): Payer: Self-pay | Admitting: Obstetrics & Gynecology

## 2021-06-02 ENCOUNTER — Inpatient Hospital Stay (HOSPITAL_COMMUNITY)
Admission: AD | Admit: 2021-06-02 | Discharge: 2021-06-04 | DRG: 807 | Disposition: A | Discharge status: Home / Self Care | Payer: Medicaid Other | Attending: Obstetrics and Gynecology | Admitting: Obstetrics and Gynecology

## 2021-06-02 DIAGNOSIS — O26893 Other specified pregnancy related conditions, third trimester: Secondary | ICD-10-CM | POA: Diagnosis not present

## 2021-06-02 DIAGNOSIS — Z789 Other specified health status: Secondary | ICD-10-CM | POA: Diagnosis present

## 2021-06-02 DIAGNOSIS — O24420 Gestational diabetes mellitus in childbirth, diet controlled: Secondary | ICD-10-CM | POA: Diagnosis present

## 2021-06-02 DIAGNOSIS — Z3A39 39 weeks gestation of pregnancy: Secondary | ICD-10-CM | POA: Diagnosis not present

## 2021-06-02 DIAGNOSIS — O4292 Full-term premature rupture of membranes, unspecified as to length of time between rupture and onset of labor: Secondary | ICD-10-CM | POA: Diagnosis not present

## 2021-06-02 DIAGNOSIS — O4290 Premature rupture of membranes, unspecified as to length of time between rupture and onset of labor, unspecified weeks of gestation: Secondary | ICD-10-CM | POA: Diagnosis present

## 2021-06-02 DIAGNOSIS — O4693 Antepartum hemorrhage, unspecified, third trimester: Principal | ICD-10-CM | POA: Diagnosis present

## 2021-06-02 DIAGNOSIS — Z20822 Contact with and (suspected) exposure to covid-19: Secondary | ICD-10-CM | POA: Diagnosis not present

## 2021-06-02 DIAGNOSIS — M26609 Unspecified temporomandibular joint disorder, unspecified side: Secondary | ICD-10-CM | POA: Diagnosis present

## 2021-06-02 DIAGNOSIS — O24419 Gestational diabetes mellitus in pregnancy, unspecified control: Secondary | ICD-10-CM | POA: Diagnosis present

## 2021-06-02 DIAGNOSIS — O24429 Gestational diabetes mellitus in childbirth, unspecified control: Secondary | ICD-10-CM | POA: Diagnosis not present

## 2021-06-02 LAB — CBC
HCT: 38.2 % (ref 36.0–46.0)
Hemoglobin: 12.1 g/dL (ref 12.0–15.0)
MCH: 26.7 pg (ref 26.0–34.0)
MCHC: 31.7 g/dL (ref 30.0–36.0)
MCV: 84.3 fL (ref 80.0–100.0)
Platelets: 325 10*3/uL (ref 150–400)
RBC: 4.53 MIL/uL (ref 3.87–5.11)
RDW: 13 % (ref 11.5–15.5)
WBC: 10.1 10*3/uL (ref 4.0–10.5)
nRBC: 0 % (ref 0.0–0.2)

## 2021-06-02 LAB — TYPE AND SCREEN
ABO/RH(D): AB POS
Antibody Screen: NEGATIVE

## 2021-06-02 LAB — RESP PANEL BY RT-PCR (FLU A&B, COVID) ARPGX2
Influenza A by PCR: NEGATIVE
Influenza B by PCR: NEGATIVE
SARS Coronavirus 2 by RT PCR: NEGATIVE

## 2021-06-02 LAB — POCT FERN TEST: POCT Fern Test: NEGATIVE

## 2021-06-02 LAB — GLUCOSE, CAPILLARY
Glucose-Capillary: 73 mg/dL (ref 70–99)
Glucose-Capillary: 88 mg/dL (ref 70–99)

## 2021-06-02 LAB — RPR: RPR Ser Ql: NONREACTIVE

## 2021-06-02 MED ORDER — LACTATED RINGERS IV SOLN: 500.0000 mL | INTRAVENOUS | Status: DC | PRN

## 2021-06-02 MED ORDER — FENTANYL-BUPIVACAINE-NACL 0.5-0.125-0.9 MG/250ML-% EP SOLN
12.0000 mL/h | EPIDURAL | Status: DC | PRN
  Administered 2021-06-02: 12 mL/h via EPIDURAL
  Filled 2021-06-02: qty 250

## 2021-06-02 MED ORDER — PHENYLEPHRINE 40 MCG/ML (10ML) SYRINGE FOR IV PUSH (FOR BLOOD PRESSURE SUPPORT): 80.0000 ug | PREFILLED_SYRINGE | INTRAVENOUS | Status: DC | PRN

## 2021-06-02 MED ORDER — DIBUCAINE (PERIANAL) 1 % EX OINT: 1.0000 "application " | TOPICAL_OINTMENT | CUTANEOUS | Status: DC | PRN

## 2021-06-02 MED ORDER — PRENATAL MULTIVITAMIN CH
1.0000 | ORAL_TABLET | Freq: Every day | ORAL | Status: DC
  Administered 2021-06-03 – 2021-06-04 (×2): 1 via ORAL
  Filled 2021-06-02 (×2): qty 1

## 2021-06-02 MED ORDER — EPHEDRINE 5 MG/ML INJ: 10.0000 mg | INTRAVENOUS | Status: DC | PRN

## 2021-06-02 MED ORDER — ACETAMINOPHEN 325 MG PO TABS
650.0000 mg | ORAL_TABLET | ORAL | Status: DC | PRN
  Administered 2021-06-03: 650 mg via ORAL
  Filled 2021-06-02: qty 2

## 2021-06-02 MED ORDER — SIMETHICONE 80 MG PO CHEW: 80.0000 mg | CHEWABLE_TABLET | ORAL | Status: DC | PRN

## 2021-06-02 MED ORDER — OXYTOCIN-SODIUM CHLORIDE 30-0.9 UT/500ML-% IV SOLN
2.5000 [IU]/h | INTRAVENOUS | Status: DC
  Administered 2021-06-02: 2.5 [IU]/h via INTRAVENOUS

## 2021-06-02 MED ORDER — LACTATED RINGERS IV SOLN: 500.0000 mL | Freq: Once | INTRAVENOUS | Status: DC

## 2021-06-02 MED ORDER — ONDANSETRON HCL 4 MG/2ML IJ SOLN: 4.0000 mg | INTRAMUSCULAR | Status: DC | PRN

## 2021-06-02 MED ORDER — ACETAMINOPHEN 325 MG PO TABS: 650.0000 mg | ORAL_TABLET | ORAL | Status: DC | PRN

## 2021-06-02 MED ORDER — FENTANYL CITRATE (PF) 100 MCG/2ML IJ SOLN: 50.0000 ug | INTRAMUSCULAR | Status: DC | PRN

## 2021-06-02 MED ORDER — SOD CITRATE-CITRIC ACID 500-334 MG/5ML PO SOLN: 30.0000 mL | ORAL | Status: DC | PRN

## 2021-06-02 MED ORDER — WITCH HAZEL-GLYCERIN EX PADS: 1.0000 "application " | MEDICATED_PAD | CUTANEOUS | Status: DC | PRN

## 2021-06-02 MED ORDER — OXYCODONE-ACETAMINOPHEN 5-325 MG PO TABS: 2.0000 | ORAL_TABLET | ORAL | Status: DC | PRN

## 2021-06-02 MED ORDER — BENZOCAINE-MENTHOL 20-0.5 % EX AERO: 1.0000 "application " | INHALATION_SPRAY | CUTANEOUS | Status: DC | PRN

## 2021-06-02 MED ORDER — LIDOCAINE HCL (PF) 1 % IJ SOLN: 30.0000 mL | INTRAMUSCULAR | Status: DC | PRN

## 2021-06-02 MED ORDER — COCONUT OIL OIL: 1.0000 "application " | TOPICAL_OIL | Status: DC | PRN

## 2021-06-02 MED ORDER — PHENYLEPHRINE 40 MCG/ML (10ML) SYRINGE FOR IV PUSH (FOR BLOOD PRESSURE SUPPORT)
80.0000 ug | PREFILLED_SYRINGE | INTRAVENOUS | Status: DC | PRN
  Filled 2021-06-02: qty 10

## 2021-06-02 MED ORDER — LACTATED RINGERS IV SOLN: INTRAVENOUS | Status: DC

## 2021-06-02 MED ORDER — SENNOSIDES-DOCUSATE SODIUM 8.6-50 MG PO TABS
2.0000 | ORAL_TABLET | ORAL | Status: DC
  Administered 2021-06-02 – 2021-06-04 (×2): 2 via ORAL
  Filled 2021-06-02 (×2): qty 2

## 2021-06-02 MED ORDER — ONDANSETRON HCL 4 MG/2ML IJ SOLN: 4.0000 mg | Freq: Four times a day (QID) | INTRAMUSCULAR | Status: DC | PRN

## 2021-06-02 MED ORDER — TETANUS-DIPHTH-ACELL PERTUSSIS 5-2.5-18.5 LF-MCG/0.5 IM SUSY: 0.5000 mL | PREFILLED_SYRINGE | Freq: Once | INTRAMUSCULAR | Status: DC

## 2021-06-02 MED ORDER — OXYTOCIN BOLUS FROM INFUSION
333.0000 mL | Freq: Once | INTRAVENOUS | Status: AC
  Administered 2021-06-02: 333 mL via INTRAVENOUS

## 2021-06-02 MED ORDER — OXYTOCIN-SODIUM CHLORIDE 30-0.9 UT/500ML-% IV SOLN
1.0000 m[IU]/min | INTRAVENOUS | Status: DC
  Administered 2021-06-02: 2 m[IU]/min via INTRAVENOUS
  Filled 2021-06-02: qty 500

## 2021-06-02 MED ORDER — DIPHENHYDRAMINE HCL 25 MG PO CAPS: 25.0000 mg | ORAL_CAPSULE | Freq: Four times a day (QID) | ORAL | Status: DC | PRN

## 2021-06-02 MED ORDER — IBUPROFEN 600 MG PO TABS
600.0000 mg | ORAL_TABLET | Freq: Four times a day (QID) | ORAL | Status: DC
  Administered 2021-06-02 – 2021-06-04 (×7): 600 mg via ORAL
  Filled 2021-06-02 (×7): qty 1

## 2021-06-02 MED ORDER — TERBUTALINE SULFATE 1 MG/ML IJ SOLN: 0.2500 mg | Freq: Once | INTRAMUSCULAR | Status: DC | PRN

## 2021-06-02 MED ORDER — ONDANSETRON HCL 4 MG PO TABS: 4.0000 mg | ORAL_TABLET | ORAL | Status: DC | PRN

## 2021-06-02 MED ORDER — DIPHENHYDRAMINE HCL 50 MG/ML IJ SOLN: 12.5000 mg | INTRAMUSCULAR | Status: DC | PRN

## 2021-06-02 MED ORDER — LIDOCAINE-EPINEPHRINE (PF) 2 %-1:200000 IJ SOLN
INTRAMUSCULAR | Status: DC | PRN
  Administered 2021-06-02: 5 mL via EPIDURAL

## 2021-06-02 NOTE — Anesthesia Procedure Notes (Addendum)
Epidural ?Patient location during procedure: OB ?Start time: 06/02/2021 6:20 PM ?End time: 06/02/2021 6:30 PM ? ?Staffing ?Anesthesiologist: Freddrick March, MD ?Performed: anesthesiologist  ? ?Preanesthetic Checklist ?Completed: patient identified, IV checked, risks and benefits discussed, monitors and equipment checked, pre-op evaluation and timeout performed ? ?Epidural ?Patient position: sitting ?Prep: DuraPrep and site prepped and draped ?Patient monitoring: continuous pulse ox, blood pressure, heart rate and cardiac monitor ?Approach: midline ?Location: L3-L4 ?Injection technique: LOR air ? ?Needle:  ?Needle type: Tuohy  ?Needle gauge: 17 G ?Needle length: 9 cm ?Needle insertion depth: 5 cm ?Catheter type: closed end flexible ?Catheter size: 19 Gauge ?Catheter at skin depth: 10 cm ?Test dose: negative ? ?Assessment ?Sensory level: T8 ?Events: blood not aspirated, injection not painful, no injection resistance, no paresthesia and negative IV test ? ?Additional Notes ?Patient identified. Risks/Benefits/Options discussed with patient including but not limited to bleeding, infection, nerve damage, paralysis, failed block, incomplete pain control, headache, blood pressure changes, nausea, vomiting, reactions to medication both or allergic, itching and postpartum back pain. Confirmed with bedside nurse the patient's most recent platelet count. Confirmed with patient that they are not currently taking any anticoagulation, have any bleeding history or any family history of bleeding disorders. Patient expressed understanding and wished to proceed. All questions were answered. Sterile technique was used throughout the entire procedure. Please see nursing notes for vital signs. Test dose was given through epidural catheter and negative prior to continuing to dose epidural or start infusion. Warning signs of high block given to the patient including shortness of breath, tingling/numbness in hands, complete motor block,  or any concerning symptoms with instructions to call for help. Patient was given instructions on fall risk and not to get out of bed. All questions and concerns addressed with instructions to call with any issues or inadequate analgesia.  Reason for block:procedure for pain ? ? ? ?

## 2021-06-02 NOTE — MAU Note (Signed)
...  Monica Steele is a 36 y.o. at 25w3dhere in MAU reporting: White fluid that leaked onto her bed around 0600 this morning and she states at 0800 when she used the restroom she saw blood with several blood clots and the largest blood clot was black and the size of a quarter. She states she is feeling lower abdominal pain every 5 minutes and feels it is because the "baby dropped" and is moving. +FM.  ? ?GDM. GBS-. ?  ?Pain score: 6/10 lower abdomen  ? ?FHT: 145 initial external ?  ? ?

## 2021-06-02 NOTE — Lactation Note (Signed)
This note was copied from a baby's chart. ?Lactation Consultation Note ?Used interpreter [place] [phone] for consult. ?Assisted baby in cradle position to latch. ?Baby latched off and on and then finally started BF well. No swallows heard at  this time but a good latch. ?Baby appears SGA. Mom is BF/formula.  ?Encouraged STS and I&O. ?Praised mom for a good latch and feeding.  ?Will f/u on MBU. ? ?Patient Name: Monica Steele ?Today's Date: 06/02/2021 ?Reason for consult: L&D Initial assessment;Term ?Age:79 hours ? ?Maternal Data ?Does the patient have breastfeeding experience prior to this delivery?: Yes ?How long did the patient breastfeed?: 1-2 months ? ?Feeding ?  ? ?LATCH Score ?Latch: Grasps breast easily, tongue down, lips flanged, rhythmical sucking. ? ?Audible Swallowing: None ? ?Type of Nipple: Everted at rest and after stimulation ? ?Comfort (Breast/Nipple): Soft / non-tender ? ?Hold (Positioning): Assistance needed to correctly position infant at breast and maintain latch. ? ?LATCH Score: 7 ? ? ?Lactation Tools Discussed/Used ?  ? ?Interventions ?Interventions: Assisted with latch;Skin to skin ? ?Discharge ?  ? ?Consult Status ?Consult Status: Follow-up ?Date: 06/03/21 ?Follow-up type: In-patient ? ? ? ?Theodoro Kalata ?06/02/2021, 8:19 PM ? ? ? ?

## 2021-06-02 NOTE — MAU Provider Note (Signed)
S: [name] is a 36 y.o. [phone] at [redacted]w[redacted]d who presents to MAU today complaining of leaking of fluid since 0600. She endorses vaginal bleeding. She endorses contractions. She reports normal fetal movement.   ? ?O: BP 110/69   Pulse 95   Temp 99 ?F (37.2 ?C) (Oral)   Resp 16   Ht 5' 1.02" (1.55 m)   Wt 74.1 kg   LMP 08/29/2020   SpO2 98%   BMI 30.83 kg/m?  ?GENERAL: Well-developed, well-nourished female in no acute distress.  ?HEAD: Normocephalic, atraumatic.  ?CHEST: Normal effort of breathing, regular heart rate ?ABDOMEN: Soft, nontender, gravid ?PELVIC: Normal external female genitalia. Vagina is pink and rugated. Cervix with normal contour, no lesions. Normal discharge.  + pooling of thick, dark blood.  ? ?Cervical exam:  ?Dilation: 1 ?Effacement (%): 60 ?Exam by:: JElenora Fender NP ? ? ?Fetal Monitoring: ?Baseline: 135 bpm ?Variability: Moderate  ?Accelerations: 15x15 ?Decelerations: None ?Contractions: 7-8 mins apart.  ? ?No results found for this or any previous visit (from the past 24 hour(s)). ? ? ?A: ?SIUP at 375w3d?Membranes intact ?Vaginal bleeding in pregnancy ?GDM ? ?P: ? ?Admit to labor. ?Reviewed patient with Dr. PiAlwyn Pea ? ?RaLezlie LyeNP ?06/02/2021 10:58 AM  ? ? ?Pt informed that the ultrasound is considered a limited OB ultrasound and is not intended to be a complete ultrasound exam.  Patient also informed that the ultrasound is not being completed with the intent of assessing for fetal or placental anomalies or any pelvic abnormalities.  Explained that the purpose of today?s ultrasound is to assess for  presentation.  Patient acknowledges the purpose of the exam and the limitations of the study.    ? ?Vertex position ? ? ?RaLezlie LyeNP ?06/02/2021 ?11:13 AM ? ?

## 2021-06-02 NOTE — H&P (Addendum)
OB ADMISSION/ HISTORY & PHYSICAL: ? ?Admission Date: 06/02/2021  8:46 AM  ?Admit Diagnosis: Vaginal bleeding at term ? ?[facility] Monica Steele is a 36 y.o. female [phone] 51w3dpresenting for LOF at 0600. Reports feeling occasional, mild contractions. Endorses active FM. Vaginal bleeding was observed in MAU and pt is being admitted for induction at term for vaginal bleeding. Pregnancy complicated by [id number] echogenic bowel with no other markers of aneuploidies. ? ?History of current pregnancy: ?[id number]  ?Patient entered care with [facility] at 11+2 wks.   ?EDC 06/06/21 by LMP and congruent w/ 11-2 wk U/S.   ?Anatomy scan:  19+2 wks,  Echogenic bowel, HC 4th%, low lying placenta ?Antenatal testing: for growth started at 32 weeks ?Last evaluation: 36+2  wks vertex/posterior placenta/ AFI 14.1, EFW 6+10, 64%ILE, HC 10%ILE ? ?Significant prenatal events:  ?Patient Active Problem List  ? Diagnosis Date Noted  ? Vaginal bleeding in pregnancy, third trimester 06/02/2021  ? TMJ (temporomandibular joint disorder) 06/02/2021  ? Gestational diabetes 06/02/2021  ? Gastroesophageal reflux disease without esophagitis 07/12/2020  ? 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 ? ?Prenatal Labs: ?ABO, Rh: AB/Positive/-- (09/27 0000) ?Antibody: Negative (09/27 0000) ?Rubella: Immune (09/27 0000)  ?RPR: Nonreactive (09/27 0000)  ?HBsAg: Negative (09/27 0000)  ?HIV: Non-reactive (09/27 0000)  ?GTT: abnormal 3 hr gtt ?GBS: Negative/-- (02/21 0000)  ?GC/CHL: neg/neg ?Genetics:  low risk NIPS and Horizon, neg AFP ?Tdap/influenza vaccines: received both this pregnancy ? ? ?OB History  ?Gravida Para Term Preterm AB Living  ?'4 1 1 '$ 0 2 1  ?SAB IAB Ectopic Multiple Live Births  ?2 0 0 0 1  ?  ?# Outcome Date GA Lbr Len/2nd Weight Sex Delivery Anes PTL Lv  ?4 Current           ?3 Term 02/26/13 482w1d5:43 / 00:10 3221 g M Vag-Spont EPI  LIV  ?2 SAB           ?1 SAB           ? ? ?Medical / Surgical History: ?Past medical history:  ?Past Medical  History:  ?Diagnosis Date  ? Allergy   ? Dry eyes   ? Gallbladder polyp   ? GERD (gastroesophageal reflux disease)   ? H/O seasonal allergies   ? Headache   ?  ?Past surgical history:  ?Past Surgical History:  ?Procedure Laterality Date  ? COLONOSCOPY  2021  ? ESOPHAGOGASTRODUODENOSCOPY ENDOSCOPY  2021  ? ?Family History:  ?Family History  ?Problem Relation Age of Onset  ? Healthy Mother   ? Healthy Father   ? Colon cancer Neg Hx   ? Esophageal cancer Neg Hx   ? Prostate cancer Neg Hx   ? Rectal cancer Neg Hx   ?  ?Social History:  reports that she has never smoked. She has never used smokeless tobacco. She reports that she does not drink alcohol and does not use drugs. ? ?Allergies: ?Gadolinium derivatives ?  ?Current Medications at time of admission:  ?Prior to Admission medications   ?Medication Sig Start Date End Date Taking? Authorizing Provider  ?Prenatal Vit-Fe Fumarate-FA (PRENATAL MULTIVITAMIN) TABS tablet Take 1 tablet by mouth daily at 12 noon.   Yes [provider]  ?fexofenadine (ALLEGRA ALLERGY) 180 MG tablet Take 1 tablet (180 mg total) by mouth daily. 03/10/18 09/01/18  Rodriguez-Southworth, SySunday SpillersPA-C  ? ? ?Review of Systems: ?Constitutional: Negative   ?HENT: Negative   ?Eyes: Negative   ?Respiratory: Negative   ?  Cardiovascular: Negative   ?Gastrointestinal: Negative  ?Genitourinary: pos for bloody show, neg for LOF   ?Musculoskeletal: Negative   ?Skin: Negative   ?Neurological: Negative   ?Endo/Heme/Allergies: Negative   ?Psychiatric/Behavioral: Negative  ? ? ?Physical Exam: ?VS: Blood pressure 110/69, pulse 95, temperature 99 ?F (37.2 ?C), temperature source Oral, resp. rate 16, height 5' 1.02" (1.55 m), weight 74.1 kg, last menstrual period 08/29/2020, SpO2 98 %. ?AAO x3, no signs of distress ?Cardiovascular: RRR ?Respiratory: Lung fields clear to ausculation ?GU/GI: Abdomen gravid, non-tender, non-distended, active FM, vertex ?Extremities: no edema, negative for pain, tenderness, and  cords ? ?Cervical exam:Dilation: 1 ?Effacement (%): 60 ?Exam by:: Elenora Fender, NP ?FHR: baseline rate 135 / variability moderate / accelerations present / absent decelerations ?TOCO: irreg ? ? ?Prenatal Transfer Tool  ?Maternal Diabetes: Yes:  Diabetes Type:  Diet controlled ?Genetic Screening: Normal ?Maternal Ultrasounds/Referrals: Echogenic bowel ?Fetal Ultrasounds or other Referrals:  Referred to [facility]  ?Maternal Substance Abuse:  No ?Significant Maternal Medications:  None ?Significant Maternal Lab Results: Group B Strep negative and Other: abnormal 3 hr glucola test ? ? ? ?Assessment: ?36 y.o. D3U2025 [redacted]w[redacted]d?IOL for vaginal bleeding ? ?Induction stage of labor ?A1GDM ?FHR category 1 ?GBS neg ?Pain management plan: epidural per pt's request ? ? ?Plan:  ?[place]#[phone]?Admit to L&D ?Routine admission orders ?Epidural PRN ?CBG Q 4 hrs  ?Pitocin 2x2 ?Dr PAlwyn Peaaware of admission and plan of care ? ?VArrie EasternMSN, CNM ?06/02/2021 10:40 AM ? ?

## 2021-06-02 NOTE — Anesthesia Preprocedure Evaluation (Signed)
Anesthesia Evaluation  ?Patient identified by MRN, date of birth, ID band ?Patient awake ? ? ? ?Reviewed: ?Allergy & Precautions, NPO status , Patient's Chart, lab work & pertinent test results ? ?Airway ?Mallampati: II ? ?TM Distance: >3 FB ?Neck ROM: Full ? ? ? Dental ?no notable dental hx. ? ?  ?Pulmonary ?neg pulmonary ROS,  ?  ?Pulmonary exam normal ?breath sounds clear to auscultation ? ? ? ? ? ? Cardiovascular ?negative cardio ROS ?Normal cardiovascular exam ?Rhythm:Regular Rate:Normal ? ? ?  ?Neuro/Psych ? Headaches, negative psych ROS  ? GI/Hepatic ?Neg liver ROS, GERD  ,  ?Endo/Other  ?diabetes, Gestational ? Renal/GU ?negative Renal ROS  ?negative genitourinary ?  ?Musculoskeletal ?negative musculoskeletal ROS ?(+)  ? Abdominal ?  ?Peds ? Hematology ?negative hematology ROS ?(+)   ?Anesthesia Other Findings ?IOL for vaginal bleeding ? Reproductive/Obstetrics ?(+) Pregnancy ? ?  ? ? ? ? ? ? ? ? ? ? ? ? ? ?  ?  ? ? ? ? ? ? ? ? ?Anesthesia Physical ?Anesthesia Plan ? ?ASA: 3 ? ?Anesthesia Plan: Epidural  ? ?Post-op Pain Management:   ? ?Induction:  ? ?PONV Risk Score and Plan: Treatment may vary due to age or medical condition ? ?Airway Management Planned: Natural Airway ? ?Additional Equipment:  ? ?Intra-op Plan:  ? ?Post-operative Plan:  ? ?Informed Consent: I have reviewed the patients History and Physical, chart, labs and discussed the procedure including the risks, benefits and alternatives for the proposed anesthesia with the patient or authorized representative who has indicated his/her understanding and acceptance.  ? ? ? ? ? ?Plan Discussed with: Anesthesiologist ? ?Anesthesia Plan Comments: (Patient identified. Risks, benefits, options discussed with patient including but not limited to bleeding, infection, nerve damage, paralysis, failed block, incomplete pain control, headache, blood pressure changes, nausea, vomiting, reactions to medication, itching, and post  partum back pain. Confirmed with bedside nurse the patient's most recent platelet count. Confirmed with the patient that they are not taking any anticoagulation, have any bleeding history or any family history of bleeding disorders. Patient expressed understanding and wishes to proceed. All questions were answered. )  ? ? ? ? ? ? ?Anesthesia Quick Evaluation ? ?

## 2021-06-03 LAB — GLUCOSE, CAPILLARY: Glucose-Capillary: 131 mg/dL — ABNORMAL HIGH (ref 70–99)

## 2021-06-03 LAB — CBC
HCT: 32.1 % — ABNORMAL LOW (ref 36.0–46.0)
Hemoglobin: 10.6 g/dL — ABNORMAL LOW (ref 12.0–15.0)
MCH: 27.3 pg (ref 26.0–34.0)
MCHC: 33 g/dL (ref 30.0–36.0)
MCV: 82.7 fL (ref 80.0–100.0)
Platelets: 255 10*3/uL (ref 150–400)
RBC: 3.88 MIL/uL (ref 3.87–5.11)
RDW: 13.2 % (ref 11.5–15.5)
WBC: 14.4 10*3/uL — ABNORMAL HIGH (ref 4.0–10.5)
nRBC: 0 % (ref 0.0–0.2)

## 2021-06-03 NOTE — Progress Notes (Signed)
Subjective: ?Postpartum Day # 1 : Vietnamese interpretor used. S/P NSVD due to pt was admitted on 3/16 at 39.3 weeks for PROM and progress to SVD with pitocin on 3/16 @ 1941 over 1st degree laceration repaired, had a viable baby female, ebl 239ms, with hgb drop of 12.1-10.6, pt had GDMA1 during pregnancy diet controlled, this morning FBS was 131. Patient up ad lib, denies syncope or dizziness. Reports consuming regular diet without issues and denies N/V. Patient reports 0 bowel movement + passing flatus.  Denies issues with urination and reports bleeding is "lighter."  Patient is breat and bottle feeding and reports going well.  Desires condoms. for postpartum contraception.  Pain is being appropriately managed with use of po meds.  ? ?1st laceration ?Feeding:  breast and bottle ?Contraceptive plan:  condoms ?Baby female.  ? ?Objective: ?Vital signs in last 24 hours: ?Patient Vitals for the past 24 hrs: ? BP Temp Temp src Pulse Resp SpO2  ?06/03/21 1000 108/62 97.6 ?F (36.4 ?C) Oral 84 18 --  ?06/03/21 0604 (!) 108/59 98.3 ?F (36.8 ?C) Oral 85 18 --  ?06/03/21 0248 113/76 98.2 ?F (36.8 ?C) Oral 73 18 99 %  ?06/02/21 2257 109/76 98.5 ?F (36.9 ?C) Oral 75 18 --  ?06/02/21 2148 103/73 98.6 ?F (37 ?C) Oral -- 18 96 %  ?06/02/21 2046 (!) 101/52 -- -- 74 16 --  ?06/02/21 2042 109/62 -- -- 85 -- --  ?06/02/21 2022 99/65 -- -- 83 -- --  ?06/02/21 2016 -- -- -- 95 -- --  ?06/02/21 2006 100/72 -- -- 99 16 --  ?06/02/21 2001 113/63 -- -- 100 17 --  ?06/02/21 1931 109/65 -- -- 99 -- --  ?06/02/21 1912 (!) 108/92 -- -- 90 -- --  ?06/02/21 1910 -- -- -- -- -- 100 %  ?06/02/21 1904 -- -- -- -- -- 100 %  ?06/02/21 1903 -- -- -- -- -- 100 %  ?06/02/21 1856 108/66 98.1 ?F (36.7 ?C) Oral 94 -- --  ?06/02/21 1855 -- -- -- -- -- 100 %  ?06/02/21 1851 98/87 -- -- 100 -- 100 %  ?06/02/21 1847 (!) 109/57 -- -- 84 -- --  ?06/02/21 1845 -- -- -- -- -- 100 %  ?06/02/21 1841 113/66 -- -- (!) 101 -- --  ?06/02/21 1840 -- -- -- -- -- 100 %   ?06/02/21 1836 (!) 115/59 -- -- 98 -- --  ?06/02/21 1835 (!) 115/59 -- -- 98 -- 100 %  ?06/02/21 1832 130/79 -- -- (!) 103 -- --  ?06/02/21 1830 -- -- -- -- -- 100 %  ?06/02/21 1801 120/69 -- -- 76 -- --  ?06/02/21 1731 (!) 100/48 -- -- 73 16 --  ?06/02/21 1701 107/62 -- -- 87 -- --  ?06/02/21 1632 102/66 -- -- 69 -- --  ?06/02/21 1600 121/70 -- -- 70 -- --  ?06/02/21 1531 103/63 -- -- 72 -- --  ?06/02/21 1501 (!) 89/45 -- -- 63 -- --  ?06/02/21 1440 108/68 -- -- 70 -- --  ?06/02/21 1403 99/64 -- -- 70 16 --  ?  ? ?Physical Exam:  ?General: alert, cooperative, and appears stated age ?Mood/Affect: happy ?Lungs: clear to auscultation, no wheezes, rales or rhonchi, symmetric air entry.  ?Heart: normal rate, regular rhythm, normal S1, S2, no murmurs, rubs, clicks or gallops. ?Breast: breasts appear normal, no suspicious masses, no skin or nipple changes or axillary nodes. ?Abdomen:  + bowel sounds, soft, non-tender ?GU: perineum approximate, healing well. No signs  of external hematomas.  ?Uterine Fundus: firm ?Lochia: appropriate ?Skin: Warm, Dry. ?DVT Evaluation: No evidence of DVT seen on physical exam. ?Negative Homan's sign. ? ?CBC Latest Ref Rng & Units 06/03/2021 06/02/2021 06/30/2020  ?WBC 4.0 - 10.5 K/uL 14.4(H) 10.1 10.6(H)  ?Hemoglobin 12.0 - 15.0 g/dL 10.6(L) 12.1 14.2  ?Hematocrit 36.0 - 46.0 % 32.1(L) 38.2 42.2  ?Platelets 150 - 400 K/uL 255 325 356.0  ? ? ?Results for orders placed or performed during the hospital encounter of 06/02/21 (from the past 24 hour(s))  ?Glucose, capillary     Status: None  ? Collection Time: 06/02/21  7:24 PM  ?Result Value Ref Range  ? Glucose-Capillary 88 70 - 99 mg/dL  ?CBC     Status: Abnormal  ? Collection Time: 06/03/21  5:36 AM  ?Result Value Ref Range  ? WBC 14.4 (H) 4.0 - 10.5 K/uL  ? RBC 3.88 3.87 - 5.11 MIL/uL  ? Hemoglobin 10.6 (L) 12.0 - 15.0 g/dL  ? HCT 32.1 (L) 36.0 - 46.0 %  ? MCV 82.7 80.0 - 100.0 fL  ? MCH 27.3 26.0 - 34.0 pg  ? MCHC 33.0 30.0 - 36.0 g/dL  ? RDW  13.2 11.5 - 15.5 %  ? Platelets 255 150 - 400 K/uL  ? nRBC 0.0 0.0 - 0.2 %  ?Glucose, capillary     Status: Abnormal  ? Collection Time: 06/03/21  6:03 AM  ?Result Value Ref Range  ? Glucose-Capillary 131 (H) 70 - 99 mg/dL  ?  ? ?CBG (last 3)  ?Recent Labs  ?  06/02/21 ?1242 06/02/21 ?1924 06/03/21 ?0603  ?GLUCAP 73 88 131*  ?  ? ?I/O last 3 completed shifts: ?In: -  ?Out: 450 [Urine:200; Blood:250]  ? ?Assessment ?Postpartum Day # 1 : S/P NSVD due to pt was admitted on 3/16 at 39.3 weeks for PROM and progress to SVD with pitocin on 3/16 @ 1941 over 1st degree laceration repaired, had a viable baby female, ebl 232ms, with hgb drop of 12.1-10.6, pt had GDMA1 during pregnancy diet controlled, this morning FBS was 131. Pt stable. -1 involution. Breat and bottle feeding. Hemodynamically stable.  ? ?Plan: ?Continue other mgmt as ordered ?VTE prophylactics: Early ambulated as tolerates.  ?Pain control: Motrin/Tylenol PRN ?Education given regarding options for contraception, including barrier methods, injectable contraception, IUD placement, oral contraceptives.  ?Plan for discharge tomorrow, Breastfeeding, Lactation consult, and Contraception condoms  ? ?Dr. RMancel Baleto be updated on patient status ? ?JJennie M Melham Memorial Medical CenterNP-C, CNM ?06/03/2021, 1:08 PM ?  ?

## 2021-06-03 NOTE — Lactation Note (Signed)
This note was copied from a baby's chart. ?Lactation Consultation Note ?Mom has BF and given formula. Would like to be seen by Lactation when she feeds next time. Encouraged mom to call today for Lactation. States things going OK. ? ? ?Patient Name: Monica Steele ?Today's Date: 06/03/2021 ?  ?Age:36 hours ? ?Maternal Data ?  ? ?Feeding ?Nipple Type: Slow - flow ? ?LATCH Score ?  ? ?  ? ?  ? ?  ? ?  ? ?  ? ? ?Lactation Tools Discussed/Used ?  ? ?Interventions ?  ? ?Discharge ?  ? ?Consult Status ?  ? ? ? ?Theodoro Kalata ?06/03/2021, 3:05 AM ? ? ? ?

## 2021-06-03 NOTE — Lactation Note (Signed)
This note was copied from a baby's chart. ?Lactation Consultation Note ? ?Patient Name: Monica Steele ?Today's Date: 06/03/2021 ?  ?Age:36 hours ? ?[name] (ID# [phone]) was used for the consult. I was called into room b/c RN had understood from student nurse that Mom wanted to see me. However, when I was able to speak with mother, Mom said she did not have any questions for me. The translator (who had been translating when CNM and student nurse were in the room just previously) explained that Mom felt like she had no milk and she didn't want infant to be hungry.  ? ?Mom reports having had "little" milk with her 1st child whom was primarily formula-fed. She did not pump with her 1st child. ? ?Mom was willing to pump when infant received formula. I gave her a hand pump (size 24 for L breast, size 27 for R).  ?Mom was shown how to use manual pump & how to disassemble, clean, & reassemble parts. Dishwashing detergent, plastic bin, and toothbrush for cleaning parts were provided.  ? ?Education was also done on proper bottle-feeding technique.  ?Parents had no questions at end of consult.  ? ?Larkin Ina ?06/03/2021, 11:26 AM ? ? ? ?

## 2021-06-03 NOTE — Anesthesia Postprocedure Evaluation (Signed)
Anesthesia Post Note ? ?Patient: Monica Steele [name] ? ?Procedure(s) Performed: AN AD HOC LABOR EPIDURAL ? ?  ? ?Patient location during evaluation: Mother Baby ?Anesthesia Type: Epidural ?Level of consciousness: awake ?Pain management: satisfactory to patient ?Vital Signs Assessment: post-procedure vital signs reviewed and stable ?Respiratory status: spontaneous breathing ?Cardiovascular status: stable ?Anesthetic complications: no ? ? ?No notable events documented. ? ?Last Vitals:  ?Vitals:  ? 06/03/21 0248 06/03/21 0604  ?BP: 113/76 (!) 108/59  ?Pulse: 73 85  ?Resp: 18 18  ?Temp: 36.8 ?C 36.8 ?C  ?SpO2: 99%   ?  ?Last Pain:  ?Vitals:  ? 06/03/21 0605  ?TempSrc:   ?PainSc: 0-No pain  ? ?Pain Goal:   ? ?  ?  ?  ?  ?  ?  ?  ? ?Erum Cercone ? ? ? ? ?

## 2021-06-04 DIAGNOSIS — O4290 Premature rupture of membranes, unspecified as to length of time between rupture and onset of labor, unspecified weeks of gestation: Secondary | ICD-10-CM | POA: Diagnosis present

## 2021-06-04 MED ORDER — IBUPROFEN 600 MG PO TABS: 600.0000 mg | ORAL_TABLET | Freq: Four times a day (QID) | ORAL | 0 refills | Status: DC

## 2021-06-04 NOTE — Discharge Summary (Signed)
?  SVD OB Discharge Summary ? ?   ?Patient Name: Monica Steele ?DOB: Nov 04, 1985 ?MRN: [id number] ? ?Date of admission: 06/02/2021 ?Delivering MD: Burman Foster B  ?Date of delivery: 06/02/2021 ?Type of delivery: SVD ? ?Newborn Data: ?Sex: Baby female ?Live born female  ?Birth Weight: 6 lb 9.5 oz (2990 g) ?APGAR: 9, 9 ? ?Newborn Delivery   ?Birth date/time: 06/02/2021 19:41:00 ?Delivery type: Vaginal, Spontaneous ?  ?  ? ?Feeding: breast and bottle ?Infant being discharge to home with mother in stable condition.  ? ?Admitting diagnosis: Vaginal bleeding in pregnancy, third trimester [O46.93] ?Intrauterine pregnancy: [redacted]w[redacted]d    ?Secondary diagnosis:  Active Problems: ?  Gestational diabetes ?  Language barrier ?  SVD (06/02/21) ?  Normal postpartum course ?  PROM (premature rupture of membranes) ?                              ? ?Complications: None                                                              ?Intrapartum Procedures: spontaneous vaginal delivery ?Postpartum Procedures: none ?Complications-Operative and Postpartum:  1st degree perineal laceration ?Augmentation: Pitocin ? ? ?History of Present Illness: ?[facility] a 36y.o. female, [phone] who presents at 369w3deeks gestation. [facility]  ?Her pregnancy has been complicated by: ? ?Patient Active Problem List  ? Diagnosis Date Noted  ? PROM (premature rupture of membranes) 06/04/2021  ? Normal postpartum course 06/03/2021  ? Gestational diabetes 06/02/2021  ? Language barrier 06/02/2021  ? SVD (06/02/21) 06/02/2021  ? Gastroesophageal reflux disease without esophagitis 07/12/2020  ? 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  ? ?Active Ambulatory Problems  ?  Diagnosis Date Noted  ? 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 Gastroesophageal reflux disease without esophagitis 07/12/2020  ? ?Resolved Ambulatory Problems  ?  Diagnosis Date Noted  ? Normal  labor 02/26/2013  ? NSVD (normal spontaneous vaginal delivery) 02/28/2013  ? Second-degree perineal laceration, with delivery 02/28/2013  ? Constipation 02/19/2020  ? Generalized abdominal pain 02/19/2020  ? Pelvic pain 07/12/2020  ? ?Past Medical History:  ?Diagnosis Date  ? Allergy   ? Dry eyes   ? Gallbladder polyp   ? GERD (gastroesophageal reflux disease)   ? H/O seasonal allergies   ? Headache   ?  ? ?Hospital course:  Onset of Labor With Vaginal Delivery      ?[age].o. yo [id number] 3915w3ds admitted in Latent Labor on 06/02/2021. Patient had an uncomplicated labor course as follows:  ?Membrane Rupture Time/Date: 7:04 PM ,06/02/2021   ?Delivery Method:Vaginal, Spontaneous  ?Episiotomy: None  ?Lacerations:  1st degree  ?Patient had an uncomplicated postpartum course.  She is ambulating, tolerating a regular diet, passing flatus, and urinating well. Patient is discharged home in stable condition on 06/04/21. ? ?Newborn Data: ?Birth date:06/02/2021  ?Birth time:7:41 PM  ?Gender:Female  ?Living status:Living  ?Apgars:9 ,9  ?Weight:2990 g  ?Postpartum Day # 2 : S/P NSVD due to pt was admitted on 3/16 for PROM and progressed with pitocin for SVD on  3/16 over 1st degree repair, ebl was 272ms, hgb drop of 12.1-10.6, stable pt had GDMA1 deit controlled, did have x 1 elevated fabs of 131 on 3/17, fbs was not checked today, 2H PP were unremarkable in 70-90s, pt . Pt to f/u with [facility] at 6 week for 2HGTT to check for overt DM.  Patient up ad lib, denies syncope or dizziness. Reports consuming regular diet without issues and denies N/V. Patient reports 0 bowel movement + passing flatus.  Denies issues with urination and reports bleeding is "lighter."  Patient is breast and bottle feeding and reports going well.  Desires undecided for postpartum contraception.  Pain is being appropriately managed with use of po meds.  ? ?Physical exam  ?Vitals:  ? 06/03/21 1000 06/03/21 1454 06/03/21 1959 06/04/21 0544  ?BP: 108/62 (!) 109/57  108/63 106/65  ?Pulse: 84 74 80 63  ?Resp: '18 16 16 16  '$ ?Temp: 97.6 ?F (36.4 ?C) (!) 97.5 ?F (36.4 ?C) 98 ?F (36.7 ?C) 98.1 ?F (36.7 ?C)  ?TempSrc: Oral Oral Oral Oral  ?SpO2:   100% 100%  ?Weight:      ?Height:      ? ?General: alert, cooperative, and no distress ?Lochia: appropriate ?Uterine Fundus: firm ?Perineum: approximate ?DVT Evaluation: No evidence of DVT seen on physical exam. ?Negative Homan's sign. ?No cords or calf tenderness. ?No significant calf/ankle edema. ? ?Labs: ?Lab Results  ?Component Value Date  ? WBC 14.4 (H) 06/03/2021  ? HGB 10.6 (L) 06/03/2021  ? HCT 32.1 (L) 06/03/2021  ? MCV 82.7 06/03/2021  ? PLT 255 06/03/2021  ? ?CMP Latest Ref Rng & Units 06/30/2020  ?Glucose 70 - 99 mg/dL 75  ?BUN 6 - 23 mg/dL 8  ?Creatinine 0.40 - 1.20 mg/dL 0.62  ?Sodium 135 - 145 mEq/L 140  ?Potassium 3.5 - 5.1 mEq/L 3.6  ?Chloride 96 - 112 mEq/L 104  ?CO2 19 - 32 mEq/L 28  ?Calcium 8.4 - 10.5 mg/dL 9.8  ?Total Protein 6.0 - 8.3 g/dL 8.2  ?Total Bilirubin 0.2 - 1.2 mg/dL 0.4  ?Alkaline Phos 39 - 117 U/L 76  ?AST 0 - 37 U/L 11  ?ALT 0 - 35 U/L 12  ? ? ?Date of discharge: 06/04/2021 ?Discharge Diagnoses: Term Pregnancy-delivered ?Discharge instruction: per After Visit Summary and "Baby and Me Booklet". ? ?After visit meds:  ? ?Activity:           unrestricted and pelvic rest Advance as tolerated. Pelvic rest for 6 weeks.  ?Diet:                routine ?Medications: PNV and Ibuprofen ?Postpartum contraception: Undecided ?Condition:  Pt discharge to home with baby in stable ? ?Meds: ?Allergies as of 06/04/2021   ? ?   Reactions  ? Gadolinium Derivatives Nausea And Vomiting  ? Pt vomited after multihance injection.   ? ?  ? ?  ?Medication List  ?  ? ?TAKE these medications   ? ?ibuprofen 600 MG tablet ?Commonly known as: ADVIL ?Take 1 tablet (600 mg total) by mouth every 6 (six) hours. ?  ?prenatal multivitamin Tabs tablet ?Take 1 tablet by mouth daily at 12 noon. ?  ? ?  ? ? ?Discharge Follow Up:  ? Follow-up  Information   ? ? [facility] Schedule an appointment as soon as possible for a visit in 6 week(s).   ?Specialty: Obstetrics and Gynecology ?Contact information: ?[address] ?[address] ?[phone]?[phone]? ?  ?  ? ?  ?  ? ?  ?  ? ?  [facility], NP-C, CNM ?06/04/2021, 9:27 AM  ?Noralyn Pick, [place] ? ? ? ? ? ? ?

## 2021-06-05 ENCOUNTER — Inpatient Hospital Stay (HOSPITAL_COMMUNITY)
Admission: AD | Admit: 2021-06-05 | Payer: Medicaid Other | Source: Home / Self Care | Admitting: Obstetrics and Gynecology

## 2021-06-05 ENCOUNTER — Inpatient Hospital Stay (HOSPITAL_COMMUNITY): Payer: Medicaid Other

## 2021-06-13 ENCOUNTER — Telehealth (HOSPITAL_COMMUNITY): Payer: Self-pay | Admitting: *Deleted

## 2021-06-13 NOTE — Telephone Encounter (Signed)
Interpreter left phone voicemail message. ? ?Odis Hollingshead, RN 06-13-2021 at 1:56pm ?

## 2021-07-06 DIAGNOSIS — Z8632 Personal history of gestational diabetes: Secondary | ICD-10-CM | POA: Insufficient documentation

## 2021-07-07 DIAGNOSIS — Z308 Encounter for other contraceptive management: Secondary | ICD-10-CM | POA: Diagnosis not present

## 2021-07-07 DIAGNOSIS — Z01419 Encounter for gynecological examination (general) (routine) without abnormal findings: Secondary | ICD-10-CM | POA: Diagnosis not present

## 2021-07-26 ENCOUNTER — Other Ambulatory Visit: Payer: Self-pay | Admitting: Registered Nurse

## 2021-07-26 DIAGNOSIS — J302 Other seasonal allergic rhinitis: Secondary | ICD-10-CM

## 2021-08-30 ENCOUNTER — Encounter: Payer: Self-pay | Admitting: Radiology

## 2021-08-30 ENCOUNTER — Ambulatory Visit (INDEPENDENT_AMBULATORY_CARE_PROVIDER_SITE_OTHER): Payer: Medicaid Other | Admitting: Radiology

## 2021-08-30 VITALS — BP 102/64

## 2021-08-30 DIAGNOSIS — N898 Other specified noninflammatory disorders of vagina: Secondary | ICD-10-CM

## 2021-08-30 DIAGNOSIS — R102 Pelvic and perineal pain: Secondary | ICD-10-CM

## 2021-08-30 DIAGNOSIS — R062 Wheezing: Secondary | ICD-10-CM | POA: Diagnosis not present

## 2021-08-30 LAB — PREGNANCY, URINE: Preg Test, Ur: NEGATIVE

## 2021-08-30 LAB — WET PREP FOR TRICH, YEAST, CLUE

## 2021-08-30 NOTE — Progress Notes (Signed)
      Subjective: Monica Steele is a 36 y.o. female who complains of suprapubic pelvic pain x 1 month with cream colored vaginal discharge x 3 weeks. No urinary symptoms. SVD 06/02/21, delivered by [facility]. States she had an uneventful pregnancy and delivery. She is not breastfeeding.    - Sexually active with 1 female partner(s) - Last sexual encounter: 3 days ago -Contraception: none  Review of Systems  Constitutional: Negative.   HENT: Negative.    Respiratory:  Positive for chest tightness.   Cardiovascular:  Positive for chest pain.  Gastrointestinal: Negative.   Endocrine: Negative.   Genitourinary:  Positive for pelvic pain and vaginal discharge.  Neurological: Negative.   Psychiatric/Behavioral: Negative.       Objective: Physical Exam Constitutional:      Appearance: Normal appearance. She is normal weight.  HENT:     Head: Normocephalic and atraumatic.  Cardiovascular:     Rate and Rhythm: Normal rate and regular rhythm.     Heart sounds: Normal heart sounds.  Pulmonary:     Breath sounds: Wheezing present.  Abdominal:     General: Abdomen is flat. Bowel sounds are normal.     Palpations: Abdomen is soft.  Skin:    General: Skin is warm and dry.  Neurological:     General: No focal deficit present.     Mental Status: She is alert. Mental status is at baseline.  Psychiatric:        Mood and Affect: Mood normal.        Thought Content: Thought content normal.        Judgment: Judgment normal.    -Vulva: without lesions or discharge -Vagina: discharge present, aptima swab and wet prep obtained -Cervix: no lesion or discharge, no CMT -Perineum: no lesions -Uterus: Mobile, non tender -Adnexa: no masses or tenderness  -Urine dipstick shows positive for leukocytes.  Micro exam: 10-20 WBC's per HPF and few+ bacteria.  -Microscopic wet-mount exam shows negative for pathogens, normal epithelial cells.  -UPT negative  Chaperone offered and  declined.  Assessment:/Plan:  1. Pelvic pain  - Urinalysis,Complete w/RFL Culture - Pregnancy, urine - US Transvaginal Non-OB; Future  2. Vaginal discharge Reassured negative - WET PREP FOR [place], YEAST, CLUE  3. Inspiratory wheeze on examination F/u with PCP    Avoid intercourse until symptoms are resolved. Safe sex encouraged. Avoid the use of soaps or perfumed products in the peri area. Avoid tub baths and sitting in sweaty or wet clothing for prolonged periods of time.

## 2021-09-01 LAB — URINALYSIS, COMPLETE W/RFL CULTURE
Bilirubin Urine: NEGATIVE
Glucose, UA: NEGATIVE
Hgb urine dipstick: NEGATIVE
Hyaline Cast: NONE SEEN /LPF
Ketones, ur: NEGATIVE
Nitrites, Initial: NEGATIVE
Protein, ur: NEGATIVE
RBC / HPF: NONE SEEN /HPF (ref 0–2)
Specific Gravity, Urine: 1.02 (ref 1.001–1.035)
pH: 5.5 (ref 5.0–8.0)

## 2021-09-01 LAB — URINE CULTURE
MICRO NUMBER:: 13518785
Result:: NO GROWTH
SPECIMEN QUALITY:: ADEQUATE

## 2021-09-01 LAB — CULTURE INDICATED

## 2021-09-15 ENCOUNTER — Ambulatory Visit (HOSPITAL_BASED_OUTPATIENT_CLINIC_OR_DEPARTMENT_OTHER): Payer: Medicaid Other | Admitting: Cardiology

## 2021-09-22 ENCOUNTER — Ambulatory Visit (INDEPENDENT_AMBULATORY_CARE_PROVIDER_SITE_OTHER): Payer: Medicaid Other

## 2021-09-22 ENCOUNTER — Ambulatory Visit (INDEPENDENT_AMBULATORY_CARE_PROVIDER_SITE_OTHER): Payer: Medicaid Other | Admitting: Radiology

## 2021-09-22 VITALS — BP 108/70

## 2021-09-22 DIAGNOSIS — R102 Pelvic and perineal pain: Secondary | ICD-10-CM

## 2021-09-22 NOTE — Progress Notes (Signed)
.       Subjective: Monica Steele is a 36 y.o. female  here for ultrasound follow up. She presented last month with complaints of suprapubic pelvic pain x 1 month with cream colored vaginal discharge x 3 weeks. No urinary symptoms. Negative wet mount that day. She had SVD 06/02/21, delivered by [facility]. States she had an uneventful pregnancy and delivery. She is not breastfeeding. Pain has now resolved.   Objective:  Indication: pelvic pain   Vaginal ultrasound   Anteverted uterus, normal size and shape No myometrial masses   Thin, symmetrical endometrium measuring approximately 3.65m No obvious masses or thickening seen   Both ovaries mobile, normal size with normal follicle pattern and normal perfusion   No adnexal masses, no free fluid, non tender vaginal exam  Assessment:/Plan:  1. Pelvic pain Reassured normal ultrasound Follow up prn

## 2021-09-27 ENCOUNTER — Encounter: Payer: Self-pay | Admitting: Physician Assistant

## 2021-09-27 ENCOUNTER — Ambulatory Visit (INDEPENDENT_AMBULATORY_CARE_PROVIDER_SITE_OTHER): Payer: Medicaid Other | Admitting: Gastroenterology

## 2021-09-27 ENCOUNTER — Ambulatory Visit (INDEPENDENT_AMBULATORY_CARE_PROVIDER_SITE_OTHER): Payer: Medicaid Other | Admitting: Physician Assistant

## 2021-09-27 ENCOUNTER — Encounter: Payer: Self-pay | Admitting: Gastroenterology

## 2021-09-27 VITALS — BP 100/70 | HR 80 | Ht 61.5 in | Wt 156.0 lb

## 2021-09-27 VITALS — BP 102/72 | HR 95 | Ht 61.25 in | Wt 156.2 lb

## 2021-09-27 DIAGNOSIS — R002 Palpitations: Secondary | ICD-10-CM

## 2021-09-27 DIAGNOSIS — R1084 Generalized abdominal pain: Secondary | ICD-10-CM

## 2021-09-27 DIAGNOSIS — R079 Chest pain, unspecified: Secondary | ICD-10-CM | POA: Diagnosis not present

## 2021-09-27 DIAGNOSIS — K219 Gastro-esophageal reflux disease without esophagitis: Secondary | ICD-10-CM | POA: Diagnosis not present

## 2021-09-27 DIAGNOSIS — R0602 Shortness of breath: Secondary | ICD-10-CM

## 2021-09-27 DIAGNOSIS — R1013 Epigastric pain: Secondary | ICD-10-CM | POA: Insufficient documentation

## 2021-09-27 LAB — SEDIMENTATION RATE: Sed Rate: 40 mm/hr — ABNORMAL HIGH (ref 0–32)

## 2021-09-27 LAB — D-DIMER, QUANTITATIVE: D-DIMER: 0.26 mg/L FEU (ref 0.00–0.49)

## 2021-09-27 MED ORDER — ESOMEPRAZOLE MAGNESIUM 40 MG PO CPDR: 40.0000 mg | DELAYED_RELEASE_CAPSULE | Freq: Every day | ORAL | 5 refills | Status: DC

## 2021-09-27 MED ORDER — HYOSCYAMINE SULFATE 0.125 MG SL SUBL
0.1250 mg | SUBLINGUAL_TABLET | Freq: Four times a day (QID) | SUBLINGUAL | 1 refills | Status: DC | PRN
Start: 2021-09-27 — End: 2021-11-15

## 2021-09-27 NOTE — Patient Instructions (Addendum)
If you are age 35 or younger, your body mass index should be between 19-25. Your Body mass index is 29.27 kg/m. If this is out of the aformentioned range listed, please consider follow up with your Primary Care Provider.  ________________________________________________________  The [place] [facility] providers would like to encourage you to use [facility] to communicate with providers for non-urgent requests or questions.  Due to long hold times on the telephone, sending your provider a message by [facility] may be a faster and more efficient way to get a response.  Please allow 48 business hours for a response.  Please remember that this is for non-urgent requests.  _______________________________________________________  START Nexium 40 mg 1 capsule every morning  START Hyoscyamine 1 tablet every 6-8 hours as needed.  You have been scheduled to follow up with Dr. Tarri Glenn on November 15, 2021 at 11:10 am.  Thank you for entrusting me with your care and choosing [facility].  Alonza Bogus, PA-C

## 2021-09-27 NOTE — Patient Instructions (Signed)
Medication Instructions:  Your physician recommends that you continue on your current medications as directed. Please refer to the Current Medication list given to you today.  *If you need a refill on your cardiac medications before your next appointment, please call your pharmacy*   Lab Work: TODAY: D-dimer, SED rate If you have labs (blood work) drawn today and your tests are completely normal, you will receive your results only by: [place] (if you have MyChart) OR A paper copy in the mail If you have any lab test that is abnormal or we need to change your treatment, we will call you to review the results.  Testing/Procedures: NONE  Follow-Up: At [facility], you and your health needs are our priority.  As part of our continuing mission to provide you with exceptional heart care, we have created designated Provider Care Teams.  These Care Teams include your primary Cardiologist (physician) and Advanced Practice Providers (APPs -  Physician Assistants and Nurse Practitioners) who all work together to provide you with the care you need, when you need it.  Your next appointment:   3-4 week(s)  The format for your next appointment:   In Person  Provider:   Gwyndolyn Kaufman, MD  or Robbie Lis, PA-C      Important Information About Sugar

## 2021-09-27 NOTE — Progress Notes (Signed)
Reviewed and agree with management plans. ? ?Nishaan Stanke L. Deshante Cassell, MD, MPH  ?

## 2021-09-27 NOTE — Progress Notes (Signed)
09/27/2021 [facility] Monica Steele [id number] 1985-08-27   HISTORY OF PRESENT ILLNESS: This is a 36 year old female who is a patient of Dr. Tarri Glenn.  She was last seen here 08/12/2020.  At that point her Nexium was increased to twice daily.  She was on that for her GERD and upper abdominal pain.  Shortly after that she became pregnant and discontinued her PPI altogether.  Has not resumed any of those.  Had her baby 05/2021.  She is no longer nursing.  She is still taking her MiraLAX for constipation and that is helping.  She took some NSAIDs that were prescribed postpartum just for short time, but is no longer using those.  She's had extensive evaluation including EGD, colonoscopy, CT scan, ultrasound that have all been unrevealing since late 2020.  Labs have been unremarkable.  H. pylori breath test in 2020 was negative.  The patient is primarily [place] speaking so interpreter was present during the entirety of the visit.   EGD was normal in 05/2019.  Colonoscopy in 05/2019 revealed 3 polyps that were removed and internal hemorrhoids.  Gastric biopsies revealed some gastritis, negative for H. pylori.  Esophageal and duodenal biopsies were normal.  2 of the 3 polyps were tubular adenomas and one with hyperplastic polyp   Past Medical History:  Diagnosis Date   Allergy    Dry eyes    Gallbladder polyp    GERD (gastroesophageal reflux disease)    H/O seasonal allergies    Headache    Past Surgical History:  Procedure Laterality Date   COLONOSCOPY  2021   ESOPHAGOGASTRODUODENOSCOPY ENDOSCOPY  2021    reports that she has never smoked. She has never used smokeless tobacco. She reports that she does not drink alcohol and does not use drugs. family history includes Healthy in her father and mother. Allergies  Allergen Reactions   Gadolinium Derivatives Nausea And Vomiting    Pt vomited after multihance injection.       Outpatient Encounter Medications as of 09/27/2021  Medication Sig    cetirizine (ZYRTEC) 5 MG tablet Take 5 mg by mouth daily as needed for allergies.   docusate sodium (STOOL SOFTENER) 100 MG capsule Take 100 mg by mouth daily.   ibuprofen (ADVIL) 600 MG tablet Take 1 tablet (600 mg total) by mouth every 6 (six) hours. (Patient taking differently: Take 600 mg by mouth every 6 (six) hours as needed.)   [DISCONTINUED] cetirizine (ZYRTEC) 10 MG chewable tablet Chew 10 mg by mouth daily.   [DISCONTINUED] fexofenadine (ALLEGRA ALLERGY) 180 MG tablet Take 1 tablet (180 mg total) by mouth daily.   No facility-administered encounter medications on file as of 09/27/2021.     REVIEW OF SYSTEMS  : All other systems reviewed and negative except where noted in the History of Present Illness.   PHYSICAL EXAM: BP 102/72   Pulse 95   Ht 5' 1.25" (1.556 m)   Wt 156 lb 3.2 oz (70.9 kg)   LMP 08/20/2021 (Exact Date)   SpO2 98%   BMI 29.27 kg/m  General: Well developed Asian female in no acute distress Head: Normocephalic and atraumatic Eyes:  Sclerae anicteric, conjunctiva pink. Ears: Normal auditory acuity Lungs: Clear throughout to auscultation; no W/R/R. Heart: Regular rate and rhythm; no M/R/G. Abdomen: Soft, non-distended.  BS present.  Mild epigastric TTP and some RLQ TTP as well. Musculoskeletal: Symmetrical with no gross deformities  Skin: No lesions on visible extremities Extremities: No edema  Neurological: Alert oriented  x 4, grossly non-focal Psychological:  Alert and cooperative. Normal mood and affect  ASSESSMENT AND PLAN: *GERD/epigastric abdominal pain: Has not been on any PPI for several months as she got pregnant and discontinued it.  She was on Nexium most recently.  She had previously been on twice a day and some Carafate, but being that she is not on anything right now we will just start Nexium 40 mg once daily.  Prescription sent to her pharmacy. *Lower abdominal pain: Question IBS/spasm/cramping.  We will try some Levsin every 6-8 hours as  needed.  Prescription sent to pharmacy.  Dr. Tarri Glenn had also mentioned previously consideration of a TCA or SSRI. *Constipation: Well-controlled on MiraLAX daily. *Tubular adenoma of colon 2 tubular adenomas removed on colonoscopy 05/21/2019.  Surveillance colonoscopy recommended in 2028.  **Follow-up with me or Dr. Tarri Glenn in 6 to 8 weeks.   CC:  Maximiano Coss, NP

## 2021-09-27 NOTE — Progress Notes (Signed)
Cardiology Office Note:    Date:  09/27/2021   ID:  Monica Steele, DOB March 23, 1985, MRN [id number]  PCP:  Monica Coss, NP  [facility] Cardiologist:  Monica Heinz, MD  [place] Electrophysiologist:  None   Chief Complaint: yearly follow up   History of Present Illness:    Monica Steele is a 35 y.o. female presents for follow up.   Seen by Dr. Gardiner Steele 10/11/20 for palpation, atypical chest pain and DOE with 5-[redacted] weeks pregnant. Had normal echo. Monitor without significant abnormality.   Had spontaneous vaginal delivery @ [redacted]w[redacted]d on 06/02/2021.  Patient reports intermittent upper sternal chest tightness and shortness of breath since delivery.  Reported worse pain with standing, deep breath and laying.  She was seen by GI today and advised to take PPI for GERD.  She denies orthopnea, PND, syncope, dizziness, palpitation, orthopnea or PND.  She does not breast-feed.  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 Interpreter used during conversation.   Past Medical History:  Diagnosis Date   Allergy    Dry eyes    Gallbladder polyp    GERD (gastroesophageal reflux disease)    H/O seasonal allergies    Headache     Past Surgical History:  Procedure Laterality Date   COLONOSCOPY  2021   ESOPHAGOGASTRODUODENOSCOPY ENDOSCOPY  2021    Current Medications: Current Meds  Medication Sig   cetirizine (ZYRTEC) 5 MG tablet Take 5 mg by mouth daily as needed for allergies.   docusate sodium (STOOL SOFTENER) 100 MG capsule Take 100 mg by mouth daily.   esomeprazole (NEXIUM) 40 MG capsule Take 1 capsule (40 mg total) by mouth daily at 12 noon.   hyoscyamine (LEVSIN SL) 0.125 MG SL tablet Place 1 tablet (0.125 mg total) under the tongue every 6 (six) hours as needed. (Patient taking differently: Place 0.125 mg under the tongue daily.)   ibuprofen (ADVIL) 600 MG tablet Take 1 tablet (600 mg total) by mouth every 6 (six) hours. (Patient taking differently: Take 600 mg by  mouth every 6 (six) hours as needed for mild pain or moderate pain.)     Allergies:   Gadolinium derivatives   Social History   Socioeconomic History   Marital status: Married    Spouse name: Not on file   Number of children: 1   Years of education: Not on file   Highest education level: Not on file  Occupational History   Not on file  Tobacco Use   Smoking status: Never   Smokeless tobacco: Never  Vaping Use   Vaping Use: Never used  Substance and Sexual Activity   Alcohol use: No   Drug use: No   Sexual activity: Yes    Birth control/protection: None  Other Topics Concern   Not on file  Social History Narrative   Lives home with husband, Monica Steele and one child.  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  Caffeine one [employer] daily.   Social Determinants of Health   Financial Resource Strain: Not on file  Food Insecurity: Not on file  Transportation Needs: Not on file  Physical Activity: Not on file  Stress: Not on file  Social Connections: Not on file     Family History: The patient's family history includes Healthy in her father and mother. There is no history of Colon cancer, Esophageal cancer, Prostate cancer, or Rectal cancer.    ROS:  Please see the history of present illness.    All other systems reviewed and are negative.   EKGs/Labs/Other Studies Reviewed:    The following studies were reviewed today:  Echo 10/2020  1. Left ventricular ejection fraction, by estimation, is 60 to 65%. The  left ventricle has normal function. The left ventricle has no regional  wall motion abnormalities. Left ventricular diastolic parameters were  normal. The average left ventricular  global longitudinal strain is -22.7 %. The global longitudinal strain is  normal.   2. Right ventricular systolic function is normal. The right ventricular  size is normal.   3. The mitral valve is normal in structure. No evidence of mitral valve  regurgitation.    4. The aortic valve is tricuspid. Aortic valve regurgitation is not  visualized.   Comparison(s): No prior Echocardiogram.   Conclusion(s)/Recommendation(s): Normal biventricular function without  evidence of hemodynamically significant valvular heart disease.  Monitor 10/2020 No significant abnormalities   9 days of data recorded on monitor. Patient had a min HR of 54 bpm, max HR of 118 bpm, and avg HR of 76 bpm. Predominant underlying rhythm was Sinus Rhythm. No VT, SVT, atrial fibrillation, high degree block, or pauses noted. Isolated atrial and ventricular ectopy was rare (<1%). There were 3 triggered events, which corresponded to sinus rhythm.  No significant arrhythmias detected.  EKG:  EKG is  ordered today.  The ekg ordered today demonstrates normal sinus rhythm  Recent Labs: 06/03/2021: Hemoglobin 10.6; Platelets 255  Recent Lipid Panel    Component Value Date/Time   CHOL 157 12/23/2019 1208   TRIG 96 12/23/2019 1208   HDL 41 12/23/2019 1208   CHOLHDL 3.8 12/23/2019 1208   LDLCALC 98 12/23/2019 1208     Physical Exam:    VS:  BP 100/70   Pulse 80   Ht 5' 1.5" (1.562 m)   Wt 156 lb (70.8 kg)   LMP 08/20/2021 (Exact Date)   SpO2 98%   BMI 29.00 kg/m     Wt Readings from Last 3 Encounters:  09/27/21 156 lb (70.8 kg)  09/27/21 156 lb 3.2 oz (70.9 kg)  06/02/21 163 lb 4.8 oz (74.1 kg)     GEN:  Well nourished, well developed in no acute distress HEENT: Normal NECK: No JVD; No carotid bruits LYMPHATICS: No lymphadenopathy CARDIAC: RRR, no murmurs, rubs, gallops RESPIRATORY:  Clear to auscultation without rales, wheezing or rhonchi  ABDOMEN: Soft, non-tender, non-distended MUSCULOSKELETAL:  No edema; No deformity  SKIN: Warm and dry NEUROLOGIC:  Alert and oriented x 3 PSYCHIATRIC:  Normal affect   ASSESSMENT AND PLAN:    Chest discomfort and shortness of breath Ongoing since delivery.  Some pleuritic component.  However, her pain is reproducible with  palpation with tip of the finger at 1 spot.  Could be musculoskeletal in etiology.  Given recent delivery will check D-dimer and sed rate.  Advised to take over-the-counter pain medication to see response.  If ongoing pain at follow-up will get echocardiogram to rule out cardiomyopathy.  Reviewed with Dr. Johney Frame. She will follow-up with me or Dr. Johney Frame in [facility] clinic in 3 to 4 weeks.    Medication Adjustments/Labs and Tests Ordered: Current medicines are reviewed at length with the patient today.  Concerns regarding medicines are outlined above.  Orders Placed This Encounter  Procedures   D-dimer, quantitative   Sedimentation rate   EKG 12-Lead   No orders of the defined types were placed in this encounter.  Patient Instructions  Medication Instructions:  Your physician recommends that you continue on your current medications as directed. Please refer to the Current Medication list given to you today.  *If you need a refill on your cardiac medications before your next appointment, please call your pharmacy*   Lab Work: TODAY: D-dimer, SED rate If you have labs (blood work) drawn today and your tests are completely normal, you will receive your results only by: [place] (if you have MyChart) OR A paper copy in the mail If you have any lab test that is abnormal or we need to change your treatment, we will call you to review the results.  Testing/Procedures: NONE  Follow-Up: At [facility], you and your health needs are our priority.  As part of our continuing mission to provide you with exceptional heart care, we have created designated Provider Care Teams.  These Care Teams include your primary Cardiologist (physician) and Advanced Practice Providers (APPs -  Physician Assistants and Nurse Practitioners) who all work together to provide you with the care you need, when you need it.  Your next appointment:   3-4 week(s)  The format for your next appointment:   In  Person  Provider:   Gwyndolyn Kaufman, MD  or Robbie Lis, PA-C      Important Information About Sugar         Jarrett Soho, [place]  09/27/2021 3:55 PM    [place]

## 2021-09-28 ENCOUNTER — Other Ambulatory Visit: Payer: Self-pay

## 2021-09-28 DIAGNOSIS — R700 Elevated erythrocyte sedimentation rate: Secondary | ICD-10-CM

## 2021-10-05 ENCOUNTER — Other Ambulatory Visit: Payer: Self-pay | Admitting: Physician Assistant

## 2021-10-05 ENCOUNTER — Other Ambulatory Visit (HOSPITAL_BASED_OUTPATIENT_CLINIC_OR_DEPARTMENT_OTHER): Payer: Medicaid Other

## 2021-10-05 ENCOUNTER — Ambulatory Visit (HOSPITAL_COMMUNITY)
Admission: RE | Admit: 2021-10-05 | Discharge: 2021-10-05 | Disposition: A | Discharge status: Home / Self Care | Payer: Medicaid Other | Source: Ambulatory Visit | Attending: Physician Assistant | Admitting: Physician Assistant

## 2021-10-05 DIAGNOSIS — I351 Nonrheumatic aortic (valve) insufficiency: Secondary | ICD-10-CM | POA: Diagnosis not present

## 2021-10-05 DIAGNOSIS — R0602 Shortness of breath: Secondary | ICD-10-CM

## 2021-10-05 DIAGNOSIS — R700 Elevated erythrocyte sedimentation rate: Secondary | ICD-10-CM | POA: Diagnosis not present

## 2021-10-05 LAB — ECHOCARDIOGRAM COMPLETE
AR max vel: 2.48 cm2
AV Area VTI: 2.39 cm2
AV Area mean vel: 2.47 cm2
AV Mean grad: 3 mmHg
AV Peak grad: 4.8 mmHg
Ao pk vel: 1.1 m/s
Area-P 1/2: 4.63 cm2
S' Lateral: 3 cm

## 2021-10-22 ENCOUNTER — Other Ambulatory Visit: Payer: Self-pay | Admitting: Registered Nurse

## 2021-10-22 DIAGNOSIS — J302 Other seasonal allergic rhinitis: Secondary | ICD-10-CM

## 2021-11-11 NOTE — Progress Notes (Unsigned)
Cardiology Office Note:    Date:  11/15/2021   ID:  Monica Steele, DOB [birth year], MRN [id number]  PCP:  Maximiano Coss, NP   [place] [facility] Cardiologist:  Donato Heinz, MD {   Referring MD: Maximiano Coss, NP    History of Present Illness:    Monica Steele is a 36 y.o. female with a hx of GERD who was previously followed by Dr. Gardiner Rhyme who now presents to clinic for follow-up.  Patient was initially seen by Dr. Gardiner Rhyme on 10/11/20 for palpitations, atypical chest pain and DOE when [redacted] weeks pregnant. TTE at that time with normal BiV function and no significant valve abnormalities. Cardiac monitor with no arrhythmias or significant ectopy. Had spontaneous vaginal delivery at [redacted]w[redacted]d   [facility] SOB. Pain was worse with standing, deep breathing and laying. GI evaluated and recommended PPI for GERD. D-dimer was negative. ESR mildly elevated at 40.  Repeat limited echo to assess for effusion showed LVEF 60-65%, normal RV, no significant valve disease, no effusion.  Today, the patient states that she is overall feeling better. Chest pain is improved but continues to have intermittent burning in the chest. No exertional symptoms. Occasional dizziness with position changes. No orthopnea, PND, or LE edema. Overall, patient feels back to baseline.  Past Medical History:  Diagnosis Date   Allergy    Dry eyes    Gallbladder polyp    GERD (gastroesophageal reflux disease)    H/O seasonal allergies    Headache     Past Surgical History:  Procedure Laterality Date   COLONOSCOPY  2021   ESOPHAGOGASTRODUODENOSCOPY ENDOSCOPY  2021    Current Medications: Current Meds  Medication Sig   docusate sodium (STOOL SOFTENER) 100 MG capsule Take 100 mg by mouth daily. This is the powder form   esomeprazole (NEXIUM) 40 MG capsule Take 1 capsule (40 mg total) by mouth daily at 12 noon.      Allergies:   Gadolinium derivatives   Social History   Socioeconomic History   Marital status: Married    Spouse name: Not on file   Number of children: 1   Years of education: Not on file   Highest education level: Not on file  Occupational History   Not on file  Tobacco Use   Smoking status: Never   Smokeless tobacco: Never  Vaping Use   Vaping Use: Never used  Substance and Sexual Activity   Alcohol use: No   Drug use: No   Sexual activity: Yes    Birth control/protection: None  Other Topics Concern   Not on file  Social History Narrative   Lives home with husband, HWarner Mccreedy and one child.  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  Caffeine one [employer] daily.   Social Determinants of Health   Financial Resource Strain: Not on file  Food Insecurity: Not on file  Transportation Needs: Not on file  Physical Activity: Not on file  Stress: Not on file  Social Connections: Not on file     Family History: The patient's family history includes Healthy in her father and mother. There is no history of Colon cancer, Esophageal cancer, Prostate cancer, or Rectal cancer.  ROS:   Please see the history of present illness.     All other systems reviewed and are negative.  EKGs/Labs/Other Studies Reviewed:  The following studies were reviewed today: TTE 28-Oct-2021: IMPRESSIONS     1. Left ventricular ejection fraction, by estimation, is 60 to 65%. The  left ventricle has normal function. The left ventricle has no regional  wall motion abnormalities. Left ventricular diastolic parameters were  normal.   2. Right ventricular systolic function is normal. The right ventricular  size is normal.   3. The mitral valve is normal in structure. No evidence of mitral valve  regurgitation. No evidence of mitral stenosis.   4. The aortic valve is tricuspid. Aortic valve regurgitation is mild.  Aortic valve sclerosis is present, with no evidence of aortic  valve  stenosis.   5. The inferior vena cava is normal in size with greater than 50%  respiratory variability, suggesting right atrial pressure of 3 mmHg.   Comparison(s): A prior study was performed on 10/26/2020. No significant  change from prior study.   Echo 10/2020  1. Left ventricular ejection fraction, by estimation, is 60 to 65%. The  left ventricle has normal function. The left ventricle has no regional  wall motion abnormalities. Left ventricular diastolic parameters were  normal. The average left ventricular  global longitudinal strain is -22.7 %. The global longitudinal strain is  normal.   2. Right ventricular systolic function is normal. The right ventricular  size is normal.   3. The mitral valve is normal in structure. No evidence of mitral valve  regurgitation.   4. The aortic valve is tricuspid. Aortic valve regurgitation is not  visualized.   Comparison(s): No prior Echocardiogram.   Conclusion(s)/Recommendation(s): Normal biventricular function without  evidence of hemodynamically significant valvular heart disease.   Monitor 10/2020 No significant abnormalities   9 days of data recorded on monitor. Patient had a min HR of 54 bpm, max HR of 118 bpm, and avg HR of 76 bpm. Predominant underlying rhythm was Sinus Rhythm. No VT, SVT, atrial fibrillation, high degree block, or pauses noted. Isolated atrial and ventricular ectopy was rare (<1%). There were 3 triggered events, which corresponded to sinus rhythm.  No significant arrhythmias detected.  EKG:  No new tracing  Recent Labs: 06/03/2021: Hemoglobin 10.6; Platelets 255  Recent Lipid Panel    Component Value Date/Time   CHOL 157 12/23/2019 1208   TRIG 96 12/23/2019 1208   HDL 41 12/23/2019 1208   CHOLHDL 3.8 12/23/2019 1208   LDLCALC 98 12/23/2019 1208     Risk Assessment/Calculations:          Physical Exam:    VS:  BP 98/62   Pulse 78   Ht 5' 1.5" (1.562 m)   Wt 159 lb (72.1 kg)   SpO2 97%    BMI 29.56 kg/m     Wt Readings from Last 3 Encounters:  11/15/21 159 lb (72.1 kg)  09/27/21 156 lb (70.8 kg)  09/27/21 156 lb 3.2 oz (70.9 kg)     GEN:  Well nourished, well developed in no acute distress HEENT: Normal NECK: No JVD; No carotid bruits CARDIAC: RRR, no murmurs, rubs, gallops RESPIRATORY:  Clear to auscultation without rales, wheezing or rhonchi  ABDOMEN: Soft, non-tender, non-distended MUSCULOSKELETAL:  No edema; No deformity  SKIN: Warm and dry NEUROLOGIC:  Alert and oriented x 3 PSYCHIATRIC:  Normal affect   ASSESSMENT:    1. Chest pain of uncertain etiology   2. Palpitations    PLAN:    In order of problems listed above:  #Chest Pain: Atypical in nature with reassuring cardiac work-up. TTE with  normal BiV function with no valvular diease, no effusion. D-dimer negative. Currently, doing well with significant improvement in symptoms. Given reassuring work-up and improvement in symptoms, no further work-up needed at this time.   #Palpitations: Reassuring cardiac monitor. Symptoms overall improved. No further work-up needed.           Medication Adjustments/Labs and Tests Ordered: Current medicines are reviewed at length with the patient today.  Concerns regarding medicines are outlined above.  No orders of the defined types were placed in this encounter.  No orders of the defined types were placed in this encounter.   Patient Instructions  Medication Instructions:  Your physician recommends that you continue on your current medications as directed. Please refer to the Current Medication list given to you today.  *If you need a refill on your cardiac medications before your next appointment, please call your pharmacy*  Follow-Up: At [facility], you and your health needs are our priority.  As part of our continuing mission to provide you with exceptional heart care, we have created designated Provider Care Teams.  These Care Teams include  your primary Cardiologist (physician) and Advanced Practice Providers (APPs -  Physician Assistants and Nurse Practitioners) who all work together to provide you with the care you need, when you need it.   Your next appointment:   As needed  The format for your next appointment:   In Person  Provider:   Dr. Johney Frame          Signed, Freada Bergeron, MD  11/15/2021 10:31 AM    [place]

## 2021-11-12 ENCOUNTER — Other Ambulatory Visit: Payer: Self-pay | Admitting: Family Medicine

## 2021-11-13 ENCOUNTER — Ambulatory Visit: Payer: Medicaid Other

## 2021-11-15 ENCOUNTER — Encounter (HOSPITAL_BASED_OUTPATIENT_CLINIC_OR_DEPARTMENT_OTHER): Payer: Self-pay | Admitting: Emergency Medicine

## 2021-11-15 ENCOUNTER — Emergency Department (HOSPITAL_BASED_OUTPATIENT_CLINIC_OR_DEPARTMENT_OTHER): Payer: 59

## 2021-11-15 ENCOUNTER — Ambulatory Visit
Admission: EM | Admit: 2021-11-15 | Discharge: 2021-11-15 | Disposition: A | Discharge status: Home / Self Care | Payer: Medicaid Other

## 2021-11-15 ENCOUNTER — Emergency Department (HOSPITAL_BASED_OUTPATIENT_CLINIC_OR_DEPARTMENT_OTHER)
Admission: EM | Admit: 2021-11-15 | Discharge: 2021-11-15 | Disposition: A | Discharge status: Home / Self Care | Payer: 59 | Attending: Emergency Medicine | Admitting: Emergency Medicine

## 2021-11-15 ENCOUNTER — Other Ambulatory Visit: Payer: Self-pay

## 2021-11-15 ENCOUNTER — Ambulatory Visit: Payer: Medicaid Other | Attending: Cardiology | Admitting: Cardiology

## 2021-11-15 ENCOUNTER — Encounter: Payer: Self-pay | Admitting: Emergency Medicine

## 2021-11-15 ENCOUNTER — Encounter: Payer: Self-pay | Admitting: Cardiology

## 2021-11-15 ENCOUNTER — Ambulatory Visit: Payer: Medicaid Other | Admitting: Gastroenterology

## 2021-11-15 VITALS — BP 98/62 | HR 78 | Ht 61.5 in | Wt 159.0 lb

## 2021-11-15 DIAGNOSIS — R519 Headache, unspecified: Secondary | ICD-10-CM

## 2021-11-15 DIAGNOSIS — R209 Unspecified disturbances of skin sensation: Secondary | ICD-10-CM | POA: Diagnosis not present

## 2021-11-15 DIAGNOSIS — R9431 Abnormal electrocardiogram [ECG] [EKG]: Secondary | ICD-10-CM | POA: Diagnosis not present

## 2021-11-15 DIAGNOSIS — R002 Palpitations: Secondary | ICD-10-CM | POA: Diagnosis not present

## 2021-11-15 DIAGNOSIS — R202 Paresthesia of skin: Secondary | ICD-10-CM | POA: Diagnosis not present

## 2021-11-15 DIAGNOSIS — R079 Chest pain, unspecified: Secondary | ICD-10-CM

## 2021-11-15 DIAGNOSIS — R200 Anesthesia of skin: Secondary | ICD-10-CM

## 2021-11-15 LAB — URINALYSIS, ROUTINE W REFLEX MICROSCOPIC
Bilirubin Urine: NEGATIVE
Glucose, UA: NEGATIVE mg/dL
Ketones, ur: NEGATIVE mg/dL
Nitrite: NEGATIVE
Protein, ur: NEGATIVE mg/dL
Specific Gravity, Urine: 1.02 (ref 1.005–1.030)
pH: 6 (ref 5.0–8.0)

## 2021-11-15 LAB — CBC
HCT: 44.6 % (ref 36.0–46.0)
Hemoglobin: 14.4 g/dL (ref 12.0–15.0)
MCH: 27.7 pg (ref 26.0–34.0)
MCHC: 32.3 g/dL (ref 30.0–36.0)
MCV: 85.9 fL (ref 80.0–100.0)
Platelets: 340 10*3/uL (ref 150–400)
RBC: 5.19 MIL/uL — ABNORMAL HIGH (ref 3.87–5.11)
RDW: 12.1 % (ref 11.5–15.5)
WBC: 7.3 10*3/uL (ref 4.0–10.5)
nRBC: 0 % (ref 0.0–0.2)

## 2021-11-15 LAB — BASIC METABOLIC PANEL
Anion gap: 6 (ref 5–15)
BUN: 14 mg/dL (ref 6–20)
CO2: 27 mmol/L (ref 22–32)
Calcium: 9.2 mg/dL (ref 8.9–10.3)
Chloride: 104 mmol/L (ref 98–111)
Creatinine, Ser: 0.66 mg/dL (ref 0.44–1.00)
GFR, Estimated: >60 mL/min (ref >60)
Glucose, Bld: 147 mg/dL — ABNORMAL HIGH (ref 70–99)
Potassium: 3.8 mmol/L (ref 3.5–5.1)
Sodium: 137 mmol/L (ref 135–145)

## 2021-11-15 LAB — URINALYSIS, MICROSCOPIC (REFLEX)

## 2021-11-15 LAB — MAGNESIUM: Magnesium: 2.1 mg/dL (ref 1.7–2.4)

## 2021-11-15 LAB — CBG MONITORING, ED: Glucose-Capillary: 140 mg/dL — ABNORMAL HIGH (ref 70–99)

## 2021-11-15 LAB — PREGNANCY, URINE: Preg Test, Ur: NEGATIVE

## 2021-11-15 MED ORDER — KETOROLAC TROMETHAMINE 10 MG PO TABS
10.0000 mg | ORAL_TABLET | Freq: Once | ORAL | Status: AC
  Administered 2021-11-15: 10 mg via ORAL
  Filled 2021-11-15: qty 1

## 2021-11-15 MED ORDER — PROCHLORPERAZINE MALEATE 10 MG PO TABS
5.0000 mg | ORAL_TABLET | Freq: Once | ORAL | Status: AC
  Administered 2021-11-15: 5 mg via ORAL
  Filled 2021-11-15: qty 1

## 2021-11-15 NOTE — Discharge Instructions (Signed)
  Please report to [facility] [place]  [address] [place],  [address]   

## 2021-11-15 NOTE — ED Provider Notes (Signed)
[facility] Provider Note   CSN: [id number] Arrival date & time: 11/15/21  1305     History  Chief Complaint  Patient presents with   Headache    Monica Steele is a 36 y.o. female with history of headaches and GERD who presents to the ER for headache x 1 week and intermittent numbness x 2 weeks.  Localizes her headache to the right side of her head and states that it is constant.  Describes it as "sharp poking" and "tingling".  She took Tylenol 2 days ago with minimal relief.  She is also been complaining of some left hand and left leg numbness.  No aggravating or alleviating factors.  Not exacerbated by movement.  No history of similar.  Has had similar headaches in the past, and previously had head imaging.  Went to urgent care today and they sent her to the ER for CT scan.   The history is provided by the patient. The history is limited by a language barrier. A language interpreter was used.  Headache Associated symptoms: numbness   Associated symptoms: no weakness        Home Medications Prior to Admission medications   Medication Sig Start Date End Date Taking? Authorizing Provider  docusate sodium (STOOL SOFTENER) 100 MG capsule Take 100 mg by mouth daily. This is the powder form    [provider]  esomeprazole (NEXIUM) 40 MG capsule Take 1 capsule (40 mg total) by mouth daily at 12 noon. 09/27/21   Zehr, Laban Emperor, PA-C      Allergies    Gadolinium derivatives    Review of Systems   Review of Systems  Neurological:  Positive for numbness and headaches. Negative for syncope, speech difficulty and weakness.  All other systems reviewed and are negative.   Physical Exam Updated Vital Signs BP 119/74 (BP Location: Right Arm)   Pulse 77   Temp 98 F (36.7 C)   Resp 17   Ht 5' 1.5" (1.562 m)   Wt 72.1 kg   LMP 11/08/2021   SpO2 100%   BMI 29.55 kg/m  Physical Exam Vitals and nursing note reviewed.  Constitutional:       Appearance: Normal appearance.  HENT:     Head: Normocephalic and atraumatic.     Right Ear: Tympanic membrane, ear canal and external ear normal.     Left Ear: Tympanic membrane, ear canal and external ear normal.  Eyes:     Conjunctiva/sclera: Conjunctivae normal.  Cardiovascular:     Rate and Rhythm: Normal rate and regular rhythm.  Pulmonary:     Effort: Pulmonary effort is normal. No respiratory distress.     Breath sounds: Normal breath sounds.  Abdominal:     General: There is no distension.     Palpations: Abdomen is soft.     Tenderness: There is no abdominal tenderness.  Skin:    General: Skin is warm and dry.  Neurological:     General: No focal deficit present.     Mental Status: She is alert.     Comments: Neuro: Speech is clear, able to follow commands. CN III-XII intact grossly intact. PERRLA. EOMI. Reported numbness in L shoulder, L hand, and entire L leg. Str 5/5 all extremities.     ED Results / Procedures / Treatments   Labs (all labs ordered are listed, but only abnormal results are displayed) Labs Reviewed  BASIC METABOLIC PANEL - Abnormal; Notable for the following  components:      Result Value   Glucose, Bld 147 (*)    All other components within normal limits  CBC - Abnormal; Notable for the following components:   RBC 5.19 (*)    All other components within normal limits  URINALYSIS, ROUTINE W REFLEX MICROSCOPIC - Abnormal; Notable for the following components:   APPearance HAZY (*)    Hgb urine dipstick MODERATE (*)    Leukocytes,Ua TRACE (*)    All other components within normal limits  URINALYSIS, MICROSCOPIC (REFLEX) - Abnormal; Notable for the following components:   Bacteria, UA FEW (*)    All other components within normal limits  CBG MONITORING, ED - Abnormal; Notable for the following components:   Glucose-Capillary 140 (*)    All other components within normal limits  PREGNANCY, URINE  MAGNESIUM    EKG EKG  Interpretation  Date/Time:  Tuesday November 15 2021 14:47:15 EDT Ventricular Rate:  75 PR Interval:  167 QRS Duration: 91 QT Interval:  389 QTC Calculation: 435 R Axis:   85 Text Interpretation: Sinus rhythm Low voltage, precordial leads no stemi No old tracing to compare Confirmed by Wynona Dove ([id number]) on 11/15/2021 5:02:05 PM  Radiology CT Head Wo Contrast  Result Date: 11/15/2021 CLINICAL DATA:  Headache, new or worsening, neuro deficit. EXAM: CT HEAD WITHOUT CONTRAST TECHNIQUE: Contiguous axial images were obtained from the base of the skull through the vertex without intravenous contrast. RADIATION DOSE REDUCTION: This exam was performed according to the departmental dose-optimization program which includes automated exposure control, adjustment of the mA and/or kV according to patient size and/or use of iterative reconstruction technique. COMPARISON:  Head CT 03/27/2016. FINDINGS: Brain: Cerebral volume is normal. There is no acute intracranial hemorrhage. No demarcated cortical infarct. No extra-axial fluid collection. No evidence of an intracranial mass. No midline shift. Vascular: No hyperdense vessel. Skull: No fracture or aggressive osseous lesion. Sinuses/Orbits: No mass or acute finding within the imaged orbits. Tiny mucous retention cyst within a posterior right ethmoid air cell. IMPRESSION: Unremarkable non-contrast CT appearance of the brain. No evidence of acute intracranial abnormality. Electronically Signed   By: Kellie Simmering D.O.   On: 11/15/2021 16:33    Procedures Procedures    Medications Ordered in ED Medications  ketorolac (TORADOL) tablet 10 mg (10 mg Oral Given 11/15/21 1617)  prochlorperazine (COMPAZINE) tablet 5 mg (5 mg Oral Given 11/15/21 1617)    ED Course/ Medical Decision Making/ A&P                           Medical Decision Making Amount and/or Complexity of Data Reviewed Labs: ordered. Radiology: ordered.  Risk Prescription drug management.  This  patient is a 36 y.o. female  who presents to the ED for concern of headache and intermittent left arm and leg numbness for several weeks.   Differential diagnoses prior to evaluation: The emergent differential diagnosis includes, but is not limited to,  Neurologic causes (GBS, CVA, MS, ALS, spinal cord injury); ACS, Arrhythmia, syncope, orthostatic hypotension, sepsis, hypoglycemia, electrolyte disturbance, hypothyroidism, respiratory failure, anemia, dehydration, heat injury, polypharmacy, malignancy. This is not an exhaustive differential.   Past Medical History / Co-morbidities: headaches and GERD  Additional history: Chart reviewed. Pertinent results include: CT head from 2018 and MRI from 2019 reviewed and with normal intracranial abnormality.   Physical Exam: Physical exam performed. The pertinent findings include: Normal vital signs.  Reported numbness to L hand, L shoulder,  L leg. Normal strength. Cranial nerve exam normal.   Lab Tests/Imaging studies: I personally interpreted labs/imaging and the pertinent results include:  CBC and BMP unremarkable.  Urinalysis negative for hematuria or infection.  Normal magnesium.  Negative pregnancy.  CT head without acute intracranial abnormality. I agree with the radiologist interpretation.  Cardiac monitoring: EKG obtained and interpreted by my attending physician which shows: sinus rhythm   Medications: I ordered medication including toradol and compazine.  I have reviewed the patients home medicines and have made adjustments as needed.  She reports symptoms have somewhat improved.   Disposition: After consideration of the diagnostic results and the patients response to treatment, I feel that emergency department workup does not suggest an emergent condition requiring admission or immediate intervention beyond what has been performed at this time. The plan is: Charged home with neurology follow-up.  Very low concern for stroke or TIA based on  pattern and frequency of symptoms.  The patient is safe for discharge and has been instructed to return immediately for worsening symptoms, change in symptoms or any other concerns.         Final Clinical Impression(s) / ED Diagnoses Final diagnoses:  Right-sided headache  Paresthesias    Rx / DC Orders ED Discharge Orders          Ordered    Ambulatory referral to Neurology       Comments: An appointment is requested in approximately: 2 weeks   11/15/21 1804           Portions of this report may have been transcribed using voice recognition software. Every effort was made to ensure accuracy; however, inadvertent computerized transcription errors may be present.    Kateri Plummer, PA-C 11/15/21 [place], DO 11/16/21 [phone]

## 2021-11-15 NOTE — ED Provider Notes (Signed)
[name]    CSN: [id number] Arrival date & time: 11/15/21  1038      History   Chief Complaint Chief Complaint  Patient presents with   Headache    HPI [facility] Thi Barkalow is a 36 y.o. female.   Patient here today for evaluation of right sided headache she has had the last week. She denies any nausea or vomiting but does endorse numbness to her left arm and left leg.   The history is provided by the patient.  Headache Associated symptoms: numbness   Associated symptoms: no fever, no nausea and no vomiting     Past Medical History:  Diagnosis Date   Allergy    Dry eyes    Gallbladder polyp    GERD (gastroesophageal reflux disease)    H/O seasonal allergies    Headache     Patient Active Problem List   Diagnosis Date Noted   Abdominal pain, epigastric 09/27/2021   PROM (premature rupture of membranes) 06/04/2021   Normal postpartum course 06/03/2021   Gestational diabetes 06/02/2021   Language barrier 06/02/2021   SVD (06/02/21) 06/02/2021   Gastroesophageal reflux disease without esophagitis 07/12/2020   Generalized abdominal pain 02/19/2020   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 Authorizing Provider  docusate sodium (STOOL SOFTENER) 100 MG capsule Take 100 mg by mouth daily. This is the powder form    [provider]  esomeprazole (NEXIUM) 40 MG capsule Take 1 capsule (40 mg total) by mouth daily at 12 noon. 09/27/21   Zehr, Laban Emperor, PA-C    Family History Family History  Problem Relation Age of Onset   Healthy  Mother    Healthy Father    Colon cancer Neg Hx    Esophageal cancer Neg Hx    Prostate cancer Neg Hx    Rectal cancer Neg Hx     Social History Social History   Tobacco Use   Smoking status: Never   Smokeless tobacco: Never  Vaping Use   Vaping Use: Never used  Substance Use Topics   Alcohol use: No   Drug use: No     Allergies   Gadolinium derivatives   Review of Systems Review of Systems  Constitutional:  Negative for chills and fever.  Eyes:  Negative for discharge, redness and visual disturbance.  Respiratory:  Negative for shortness of breath.   Gastrointestinal:  Negative for nausea and vomiting.  Neurological:  Positive for numbness and headaches.     Physical Exam Triage Vital Signs ED Triage Vitals  Enc Vitals Group     BP 11/15/21 1207 112/76     Pulse Rate 11/15/21 1207 76     Resp 11/15/21 1207 18     Temp 11/15/21 1207 98.3 F (36.8 C)     Temp src --  SpO2 11/15/21 1207 98 %     Weight --      Height --      Head Circumference --      Peak Flow --      Pain Score 11/15/21 1206 8     Pain Loc --      Pain Edu? --      Excl. in [place]? --    No data found.  Updated Vital Signs BP 112/76   Pulse 76   Temp 98.3 F (36.8 C)   Resp 18   SpO2 98%   Breastfeeding No      Physical Exam Vitals and nursing note reviewed.  Constitutional:      General: She is not in acute distress.    Appearance: She is well-developed. She is not ill-appearing.  HENT:     Head: Normocephalic and atraumatic.  Cardiovascular:     Rate and Rhythm: Normal rate.  Pulmonary:     Effort: Pulmonary effort is normal. No respiratory distress.  Neurological:     Mental Status: She is alert.  Psychiatric:        Mood and Affect: Mood normal.        Behavior: Behavior normal.      UC Treatments / Results  Labs (all labs ordered are listed, but only abnormal results are displayed) Labs Reviewed - No data to display  EKG   Radiology No results  found.  Procedures Procedures (including critical care time)  Medications Ordered in UC Medications - No data to display  Initial Impression / Assessment and Plan / UC Course  I have reviewed the triage vital signs and the nursing notes.  Pertinent labs & imaging results that were available during my care of the patient were reviewed by me and considered in my medical decision making (see chart for details).    Patient with normal vitals, appears stable, however given reported unilateral numbness recommended further evaluation in the ED for head CT scan, etc. Patient expresses understanding and will travel via POV to [facility] for further screening.   Final Clinical Impressions(s) / UC Diagnoses   Final diagnoses:  Acute nonintractable headache, unspecified headache type  Numbness on left side     Discharge Instructions       Please report to [facility]  [place], [place] [address]   ED Prescriptions   None    PDMP not reviewed this encounter.   Francene Finders, PA-C 11/15/21 1235

## 2021-11-15 NOTE — ED Notes (Signed)
Patient is being discharged from the Urgent Care and sent to the Emergency Department via POV . Per Ewell Poe PA, patient is in need of higher level of care due to HA and left side numbness and tingling. Patient is aware and verbalizes understanding of plan of care.  Vitals:   11/15/21 1207  BP: 112/76  Pulse: 76  Resp: 18  Temp: 98.3 F (36.8 C)  SpO2: 98%

## 2021-11-15 NOTE — ED Triage Notes (Signed)
Interpreter usedBailey Steele, [phone] Pt arrives pov, steady gait, c/o RT side HA and feeling light headed x 1 week. Referral from UC. Bilaterally equal, Speech clear, VAN -

## 2021-11-15 NOTE — Discharge Instructions (Addendum)
You were seen in the emergency department today for headache and numbness.  As we discussed your lab work and CT scan of your head were normal.  I recommend following up with the neurologist, and I have sent a referral for you.  I have also attached their contact information for you to call if you have not heard from them by next week.  You can take ibuprofen or Tylenol as needed for headache. _____________________________________________________________ Hm nay b?n ???c ??a vo khoa c?p c?u v ?au ??u v t li?t.  Nh? chng ta ? th?o lu?n, cng vi?c trong phng th nghi?m v k?t qu? ch?p CT ??u c?a b?n ??u bnh th??ng. Ti khuyn b?n nn lin h? v?i bc s? th?n kinh v ti ? g?i gi?y gi?i thi?u cho b?n. Ti c?ng ? ?nh km thng tin lin h? c?a h? ?? b?n c th? g?i n?u tu?n sau b?n khng nh?n ???c ph?n h?i t? h?.  B?n c th? dng ibuprofen ho?c Tylenol khi c?n thi?t khi b? ?au ??u.

## 2021-11-15 NOTE — Patient Instructions (Signed)
Medication Instructions:  Your physician recommends that you continue on your current medications as directed. Please refer to the Current Medication list given to you today.  *If you need a refill on your cardiac medications before your next appointment, please call your pharmacy*  Follow-Up: At [facility], you and your health needs are our priority.  As part of our continuing mission to provide you with exceptional heart care, we have created designated Provider Care Teams.  These Care Teams include your primary Cardiologist (physician) and Advanced Practice Providers (APPs -  Physician Assistants and Nurse Practitioners) who all work together to provide you with the care you need, when you need it.   Your next appointment:   As needed  The format for your next appointment:   In Person  Provider:   Dr. Johney Frame

## 2021-11-15 NOTE — ED Triage Notes (Signed)
Pt is present today with c./o HA. Pt states that her HA started one week ago.

## 2021-12-06 ENCOUNTER — Encounter: Payer: Self-pay | Admitting: Nurse Practitioner

## 2021-12-06 ENCOUNTER — Ambulatory Visit (INDEPENDENT_AMBULATORY_CARE_PROVIDER_SITE_OTHER): Payer: 59 | Admitting: Nurse Practitioner

## 2021-12-06 VITALS — BP 116/68 | HR 92 | Ht 61.0 in | Wt 160.1 lb

## 2021-12-06 DIAGNOSIS — R1084 Generalized abdominal pain: Secondary | ICD-10-CM | POA: Diagnosis not present

## 2021-12-06 DIAGNOSIS — G8929 Other chronic pain: Secondary | ICD-10-CM

## 2021-12-06 DIAGNOSIS — K5909 Other constipation: Secondary | ICD-10-CM | POA: Diagnosis not present

## 2021-12-06 NOTE — Patient Instructions (Addendum)
Increase water to at least 60 oz daily.  Increase Miralax to once daily ( currently taking as needed) Add two stool softeners at bedtime.  Call in 3 weeks if constipation not resolved and we can try amitiza 8 mg twice daily.    T?ng l??ng n??c ln t nh?t 60 oz m?i ngy.  T?ng Miralax ln m?t l?n m?i ngy (hi?n ?ang dng khi c?n thi?t)  Thm hai lo?i thu?c lm m?m phn tr??c khi ?i ng?.  G?i sau 3 tu?n n?u tnh tr?ng to bn [place] gi?m v chng ta c th? th? dng amitiza 8 mg hai l?n m?i ngy.  It was a pleasure to see you today!  Thank you for trusting me with your gastrointestinal care!

## 2021-12-06 NOTE — Progress Notes (Signed)
Chief Complaint:  lower abdominal pain   Assessment &  Plan   #36 year old [place] female with chronic GERD, chronic upper and lower abdominal pain, chronic constipation suboptimally controlled. Previous extensive evaluation unrevealing including EGD, colonoscopy, CT scan, ultrasound, H. pylori breath test.  Increase water to at least 60 oz daily.  Increase Miralax to once daily ( currently taking as needed) Add two stool softeners at bedtime.  Call in 3 weeks if constipation not resolved and we can try Amitiza 8 mg twice daily.   #History of two small adenomatous colon polyps in 2021. A 7-year surveillance colonoscopy recommended   HPI   [facility] Monica Steele is a 36 y.o. female known to Dr. Tarri Glenn with a past medical history significant for chronic constipation , GERD , non-H. pylori gastritis , colon polyps X. See [place] /PSH for additional history   Patient is [place], here with an interpreter.   Last seen by Monica Bogus, PA on 09/27/21. See that note  for details.   She was evaluated by Cardiology in July for chest pain and shortness of breath since giving birth.  Pain thought to be musculoskeletal. D-dimer was normal, ESR elevated at 40.  Echocardiogram was normal.    Interval History:  She is here for "old pain" in abdomen. She is inquiring about the infection in her intestines found at time of colonoscopy.  I reviewed reports / biopsies and subsequent office notes and cannot determine what she is referring to .   She complains of discomfort when pressing and then releasing mid lower abdomen. Has same pain if she wears restrictive clothing.  She feels constipated on a regular basis. Stools are hard and she strains. Taking Miralax but only as needed. Drinking very little water, maybe 2 bottles a day.    Previous GI Evaluation   March 2021 EGD and Colonoscopy EGD  -normal.  Colonoscopy - One 2 mm polyp in the sigmoid colon, removed with a cold snare. Resected and  retrieved. - One 2 mm polyp at the hepatic flexure, removed with a cold snare. Resected and retrieved. - One 2 mm polyp in the rectum, removed with a cold snare. Resected and retrieved. - Non-bleeding internal hemorrhoids. - The examination was otherwise normal on direct and retroflexion views.  Diagnosis 1. Surgical [P], duodenum - BENIGN DUODENAL MUCOSA - NO ACUTE INFLAMMATION, VILLOUS BLUNTING OR INCREASED INTRAEPITHELIAL LYMPHOCYTES 2. Surgical [P], fundus, gastric antrum and gastric body - MILD CHRONIC GASTRITIS WITHOUT ACTIVITY - NO H. PYLORI OR INTESTINAL METAPLASIA IDENTIFIED - SEE COMMENT 3. Surgical [P], distal esophagus - BENIGN SQUAMOUS MUCOSA - NO INCREASED INTRAEPITHELIAL EOSINOPHILS 4. Surgical [P], mid esophagus and proximal esophagus - BENIGN SQUAMOUS MUCOSA - NO INCREASED INTRAEPITHELIAL EOSINOPHILS 5. Surgical [P], colon, rectum, sigmoid and hepatic flexure, polyp (3) - TUBULAR ADENOMA (2 OF 3 FRAGMENTS) - HYPERPLASTIC POLYP (1 OF 3 FRAGMENTS) - NO HIGH GRADE DYSPLASIA OR MALIGNANCY IDENTIFIED  Labs:     Latest Ref Rng & Units 11/15/2021    1:36 PM 06/03/2021    5:36 AM 06/02/2021   11:22 AM  CBC  WBC 4.0 - 10.5 K/uL 7.3  14.4  10.1   Hemoglobin 12.0 - 15.0 g/dL 14.4  10.6  12.1   Hematocrit 36.0 - 46.0 % 44.6  32.1  38.2   Platelets 150 - 400 K/uL 340  255  325        Latest Ref Rng & Units 06/30/2020    9:09 AM 12/23/2019  12:08 PM 09/17/2019    9:18 AM  Hepatic Function  Total Protein 6.0 - 8.3 g/dL 8.2  7.3  7.9   Albumin 3.5 - 5.2 g/dL 4.3  4.5  4.6   AST 0 - 37 U/L 11  13  12    ALT 0 - 35 U/L 12  14  18    Alk Phosphatase 39 - 117 U/L 76  76  78   Total Bilirubin 0.2 - 1.2 mg/dL 0.4  0.2  0.4      Past Medical History:  Diagnosis Date   Allergy    Dry eyes    Gallbladder polyp    GERD (gastroesophageal reflux disease)    H/O seasonal allergies    Headache     Past Surgical History:  Procedure Laterality Date   COLONOSCOPY  2021    ESOPHAGOGASTRODUODENOSCOPY ENDOSCOPY  2021    Current Medications, Allergies, Family History and Social History were reviewed in [employer] record.     Current Outpatient Medications  Medication Sig Dispense Refill   docusate sodium (STOOL SOFTENER) 100 MG capsule Take 100 mg by mouth daily. This is the powder form     esomeprazole (NEXIUM) 40 MG capsule Take 1 capsule (40 mg total) by mouth daily at 12 noon. 30 capsule 5   No current facility-administered medications for this visit.    Review of Systems: No chest pain. No shortness of breath. No urinary complaints.    Physical Exam  Wt Readings from Last 3 Encounters:  12/06/21 160 lb 2 oz (72.6 kg)  11/15/21 158 lb 15.2 oz (72.1 kg)  11/15/21 159 lb (72.1 kg)    BP 116/68   Pulse 92   Ht 5' 1"  (1.549 m)   Wt 160 lb 2 oz (72.6 kg)   LMP 11/08/2021   BMI 30.26 kg/m  Constitutional:  Generally well appearing female in no acute distress. Psychiatric: Pleasant. Normal mood and affect. Behavior is normal. EENT: Pupils normal.  Conjunctivae are normal. No scleral icterus. Neck supple.  Cardiovascular: Normal rate, regular rhythm. No edema Pulmonary/chest: Effort normal and breath sounds normal. No wheezing, rales or rhonchi. Abdominal: Soft, nondistended, nontender. Bowel sounds active throughout. There are no masses palpable. No hepatomegaly. Neurological: Alert and oriented to person place and time. Skin: Skin is warm and dry. No rashes noted.  I spent 30 minutes total reviewing records, obtaining history, performing exam, counseling patient and documenting visit / findings.    [name], NP  12/06/2021, 3:07 PM

## 2021-12-06 NOTE — Progress Notes (Signed)
Reviewed and agree with management plans. ? ?Monica Fedor L. Khyleigh Furney, MD, MPH  ?

## 2021-12-21 ENCOUNTER — Ambulatory Visit (INDEPENDENT_AMBULATORY_CARE_PROVIDER_SITE_OTHER): Payer: 59 | Admitting: Diagnostic Neuroimaging

## 2021-12-21 VITALS — BP 106/72 | HR 78 | Ht 59.0 in | Wt 165.1 lb

## 2021-12-21 DIAGNOSIS — G43109 Migraine with aura, not intractable, without status migrainosus: Secondary | ICD-10-CM

## 2021-12-21 DIAGNOSIS — H5213 Myopia, bilateral: Secondary | ICD-10-CM | POA: Diagnosis not present

## 2021-12-21 MED ORDER — RIZATRIPTAN BENZOATE 10 MG PO TBDP: 10.0000 mg | ORAL_TABLET | ORAL | 11 refills | Status: DC | PRN

## 2021-12-21 MED ORDER — TOPIRAMATE 50 MG PO TABS: 50.0000 mg | ORAL_TABLET | Freq: Two times a day (BID) | ORAL | 12 refills | Status: DC

## 2021-12-21 NOTE — Progress Notes (Unsigned)
[facility]  PATIENT: Monica Steele DOB: 09-11-1985  REFERRING CLINICIAN: Roemhildt, Lorin T, PA-C HISTORY FROM: patient  REASON FOR VISIT: new consult    HISTORICAL  CHIEF COMPLAINT:  Chief Complaint  Patient presents with   New Patient (Initial Visit)    Pt reports having constant headaches and not tolerable. She states its all the time pain. She reports having headaches more than a month. She reports taking Tylenol before she went to the ER and that did not help at all. Room 7 with interpreter     HISTORY OF PRESENT ILLNESS:   36 year old female here for evaluation of headaches.  For past 1 month has had intermittent headaches lasting 20 to 30 minutes at a time, 3-4 times a week, with right-sided pulsating and throbbing sensation.  Sometimes associated with dizziness, spinning, blurred vision sensation.  Sometimes associated with seeing spots.  She has tried Tylenol without relief.  No prior similar headaches.  Has had CT of the head which was unremarkable.  No family history of migraine.  No specific triggering or aggravating factors.   REVIEW OF SYSTEMS: Full 14 system review of systems performed and negative with exception of: As per HPI.  ALLERGIES: Allergies  Allergen Reactions   Gadolinium Derivatives Nausea And Vomiting    Pt vomited after multihance injection.     HOME MEDICATIONS: Outpatient Medications Prior to Visit  Medication Sig Dispense Refill   docusate sodium (STOOL SOFTENER) 100 MG capsule Take 100 mg by mouth daily. This is the powder form     esomeprazole (NEXIUM) 40 MG capsule Take 1 capsule (40 mg total) by mouth daily at 12 noon. 30 capsule 5   No facility-administered medications prior to visit.    PAST MEDICAL HISTORY: Past Medical History:  Diagnosis Date   Allergy    Dry eyes    Gallbladder polyp    GERD (gastroesophageal reflux disease)    H/O seasonal allergies    Headache     PAST SURGICAL  HISTORY: Past Surgical History:  Procedure Laterality Date   COLONOSCOPY  2021   ESOPHAGOGASTRODUODENOSCOPY ENDOSCOPY  2021    FAMILY HISTORY: Family History  Problem Relation Age of Onset   Healthy Mother    Healthy Father    Colon cancer Neg Hx    Esophageal cancer Neg Hx    Prostate cancer Neg Hx    Rectal cancer Neg Hx     SOCIAL HISTORY: Social History   Socioeconomic History   Marital status: Married    Spouse name: Not on file   Number of children: 1   Years of education: Not on file   Highest education level: Not on file  Occupational History   Not on file  Tobacco Use   Smoking status: Never   Smokeless tobacco: Never  Vaping Use   Vaping Use: Never used  Substance and Sexual Activity   Alcohol use: No   Drug use: No   Sexual activity: Yes    Birth control/protection: None  Other Topics Concern   Not on file  Social History Narrative   Lives home with husband, [name], and one child.  She works as Engineer, civil (consulting) in [place], [place].  Education 8th grade.  Caffeine one [employer]bucks daily.   Social Determinants of Health   Financial Resource Strain: Not on file  Food Insecurity: Not on file  Transportation Needs: Not on file  Physical Activity: Not on file  Stress: Not on file  Social Connections: Not on file  Intimate Partner Violence: Not on file     PHYSICAL EXAM  GENERAL EXAM/CONSTITUTIONAL: Vitals:  Vitals:   12/21/21 0835  BP: 106/72  Pulse: 78  Weight: 165 lb 2 oz (74.9 kg)  Height: '4\' 11"'$  (1.499 m)   Body mass index is 33.35 kg/m. Wt Readings from Last 3 Encounters:  12/21/21 165 lb 2 oz (74.9 kg)  12/06/21 160 lb 2 oz (72.6 kg)  11/15/21 158 lb 15.2 oz (72.1 kg)   Patient is in no distress; well developed, nourished and groomed; neck is supple  CARDIOVASCULAR: Examination of carotid arteries is normal; no carotid bruits Regular rate and rhythm, no murmurs Examination of peripheral vascular system by observation and palpation is  normal  EYES: Ophthalmoscopic exam of optic discs and posterior segments is normal; no papilledema or hemorrhages No results found.  MUSCULOSKELETAL: Gait, strength, tone, movements noted in Neurologic exam below  NEUROLOGIC: MENTAL STATUS:      No data to display         awake, alert, oriented to person, place and time recent and remote memory intact normal attention and concentration language fluent, comprehension intact, naming intact fund of knowledge appropriate  CRANIAL NERVE:  2nd - no papilledema on fundoscopic exam 2nd, 3rd, 4th, 6th - pupils equal and reactive to light, visual fields full to confrontation, extraocular muscles intact, no nystagmus 5th - facial sensation symmetric 7th - facial strength symmetric 8th - hearing intact 9th - palate elevates symmetrically, uvula midline 11th - shoulder shrug symmetric 12th - tongue protrusion midline  MOTOR:  normal bulk and tone, full strength in the BUE, BLE  SENSORY:  normal and symmetric to light touch, temperature, vibration  COORDINATION:  finger-nose-finger, fine finger movements normal  REFLEXES:  deep tendon reflexes present and symmetric  GAIT/STATION:  narrow based gait     DIAGNOSTIC DATA (LABS, IMAGING, TESTING) - I reviewed patient records, labs, notes, testing and imaging myself where available.  Lab Results  Component Value Date   WBC 7.3 11/15/2021   HGB 14.4 11/15/2021   HCT 44.6 11/15/2021   MCV 85.9 11/15/2021   PLT 340 11/15/2021      Component Value Date/Time   NA 137 11/15/2021 1336   NA 139 12/23/2019 1208   K 3.8 11/15/2021 1336   CL 104 11/15/2021 1336   CO2 27 11/15/2021 1336   GLUCOSE 147 (H) 11/15/2021 1336   BUN 14 11/15/2021 1336   BUN 12 12/23/2019 1208   CREATININE 0.66 11/15/2021 1336   CALCIUM 9.2 11/15/2021 1336   PROT 8.2 06/30/2020 0909   PROT 7.3 12/23/2019 1208   ALBUMIN 4.3 06/30/2020 0909   ALBUMIN 4.5 12/23/2019 1208   AST 11 06/30/2020 0909    ALT 12 06/30/2020 0909   ALKPHOS 76 06/30/2020 0909   BILITOT 0.4 06/30/2020 0909   BILITOT 0.2 12/23/2019 1208   GFRNONAA >60 11/15/2021 1336   GFRAA 135 12/23/2019 1208   Lab Results  Component Value Date   CHOL 157 12/23/2019   HDL 41 12/23/2019   LDLCALC 98 12/23/2019   TRIG 96 12/23/2019   CHOLHDL 3.8 12/23/2019   Lab Results  Component Value Date   HGBA1C 5.4 06/30/2020   No results found for: "VITAMINB12" Lab Results  Component Value Date   TSH 0.98 06/30/2020    11/15/2021 CT head [I reviewed images myself and agree with interpretation. -VRP]  -Unremarkable     ASSESSMENT AND PLAN  36 y.o. year  old female here with headaches for past 1 month, with migraine features.  CT head is unremarkable.  Neurologic examination normal.   Dx:  1. Migraine with aura and without status migrainosus, not intractable       PLAN:  MIGRAINE WITH AURA  MIGRAINE TREATMENT PLAN:  MIGRAINE PREVENTION  LIFESTYLE CHANGES -Stop or avoid smoking -Decrease or avoid caffeine / alcohol -Eat and sleep on a regular schedule -Exercise several times per week - start topiramate '50mg'$  at bedtime; after 1-2 weeks increase to '50mg'$  twice a day; drink plenty of water  MIGRAINE RESCUE  - ibuprofen, tylenol as needed - rizatriptan (Maxalt) '10mg'$  as needed for breakthrough headache; may repeat x 1 after 2 hours; max 2 tabs per day or 8 per month  Meds ordered this encounter  Medications   topiramate (TOPAMAX) 50 MG tablet    Sig: Take 1 tablet (50 mg total) by mouth 2 (two) times daily.    Dispense:  60 tablet    Refill:  12   rizatriptan (MAXALT-MLT) 10 MG disintegrating tablet    Sig: Take 1 tablet (10 mg total) by mouth as needed for migraine. May repeat in 2 hours if needed    Dispense:  9 tablet    Refill:  11   Return in about 6 months (around 06/22/2022) for with NP Butler Denmark).    Penni Bombard, MD 57/0/1779, 3:90 AM Certified in Neurology, Neurophysiology and  Neuroimaging  [facility] [address], [place], [place] [address] [phone]

## 2021-12-21 NOTE — Patient Instructions (Signed)
MIGRAINE PREVENTION  LIFESTYLE CHANGES -Stop or avoid smoking -Decrease or avoid caffeine / alcohol -Eat and sleep on a regular schedule -Exercise several times per week - start topiramate '50mg'$  at bedtime; after 1-2 weeks increase to '50mg'$  twice a day; drink plenty of water   MIGRAINE RESCUE  - ibuprofen, tylenol as needed - rizatriptan (Maxalt) '10mg'$  as needed for breakthrough headache; may repeat x 1 after 2 hours; max 2 tabs per day or 8 per month

## 2021-12-22 ENCOUNTER — Encounter: Payer: Self-pay | Admitting: Diagnostic Neuroimaging

## 2022-03-14 ENCOUNTER — Ambulatory Visit (INDEPENDENT_AMBULATORY_CARE_PROVIDER_SITE_OTHER): Payer: 59 | Admitting: Family Medicine

## 2022-03-14 ENCOUNTER — Encounter: Payer: Self-pay | Admitting: Family Medicine

## 2022-03-14 ENCOUNTER — Ambulatory Visit: Payer: 59 | Admitting: Family Medicine

## 2022-03-14 VITALS — BP 116/80 | HR 87 | Temp 97.6°F | Ht 59.0 in | Wt 167.0 lb

## 2022-03-14 DIAGNOSIS — K219 Gastro-esophageal reflux disease without esophagitis: Secondary | ICD-10-CM | POA: Diagnosis not present

## 2022-03-14 DIAGNOSIS — Z7689 Persons encountering health services in other specified circumstances: Secondary | ICD-10-CM

## 2022-03-14 DIAGNOSIS — Z8632 Personal history of gestational diabetes: Secondary | ICD-10-CM | POA: Diagnosis not present

## 2022-03-14 DIAGNOSIS — Z789 Other specified health status: Secondary | ICD-10-CM | POA: Diagnosis not present

## 2022-03-14 NOTE — Progress Notes (Signed)
New Patient Office Visit  Subjective    Patient ID: Monica Steele, female    DOB: 02-Dec-1985  Age: 36 y.o. MRN: [id number]  CC:  Chief Complaint  Patient presents with   Establish Care    No concerns, last PCP office closed    HPI [facility] Monica Steele presents to establish care Previous PCP office closed   Other providers: GI Neurologist  OB/GYN  Cardiologist  Medical interpreter in person today   Hx of GERD and constipation and sees GI. Under the care of neurologist for migraines.   Hx of gestational diabetes. Delivered 9 months ago and is not breast feeding. Is not on birth control.   Denies smoking, alcohol, or drug use.  Married. 2 children, ages 16 months or   Works as Engineer, civil (consulting).     Outpatient Encounter Medications as of 03/14/2022  Medication Sig   docusate sodium (STOOL SOFTENER) 100 MG capsule Take 100 mg by mouth daily. This is the powder form   esomeprazole (NEXIUM) 40 MG capsule Take 1 capsule (40 mg total) by mouth daily at 12 noon.   rizatriptan (MAXALT-MLT) 10 MG disintegrating tablet Take 1 tablet (10 mg total) by mouth as needed for migraine. May repeat in 2 hours if needed   topiramate (TOPAMAX) 50 MG tablet Take 1 tablet (50 mg total) by mouth 2 (two) times daily.   No facility-administered encounter medications on file as of 03/14/2022.    Past Medical History:  Diagnosis Date   Allergy    Dry eyes    Gallbladder polyp    GERD (gastroesophageal reflux disease)    H/O seasonal allergies    Headache     Past Surgical History:  Procedure Laterality Date   COLONOSCOPY  2021   ESOPHAGOGASTRODUODENOSCOPY ENDOSCOPY  2021    Family History  Problem Relation Age of Onset   Healthy Mother    Healthy Father    Colon cancer Neg Hx    Esophageal cancer Neg Hx    Prostate cancer Neg Hx    Rectal cancer Neg Hx     Social History   Socioeconomic History   Marital status: Married    Spouse name: Not on file   Number of children: 1    Years of education: Not on file   Highest education level: Not on file  Occupational History   Not on file  Tobacco Use   Smoking status: Never   Smokeless tobacco: Never  Vaping Use   Vaping Use: Never used  Substance and Sexual Activity   Alcohol use: No   Drug use: No   Sexual activity: Yes    Birth control/protection: None  Other Topics Concern   Not on file  Social History Narrative   Lives home with husband, [name], and one child.  She works as Engineer, civil (consulting) in [place], [place].  Education 8th grade.  Caffeine one [employer]bucks daily.   Social Determinants of Health   Financial Resource Strain: Not on file  Food Insecurity: Not on file  Transportation Needs: Not on file  Physical Activity: Not on file  Stress: Not on file  Social Connections: Not on file  Intimate Partner Violence: Not on file    ROS      Objective    BP 116/80 (BP Location: Left Arm, Patient Position: Sitting, Cuff Size: Large)   Pulse 87   Temp 97.6 F (36.4 C) (Temporal)   Ht '4\' 11"'$  (1.499 m)  Wt 167 lb (75.8 kg)   LMP 03/08/2022   SpO2 97%   BMI 33.73 kg/m   Physical Exam Constitutional:      General: She is not in acute distress.    Appearance: She is not ill-appearing.  Eyes:     Conjunctiva/sclera: Conjunctivae normal.  Cardiovascular:     Rate and Rhythm: Normal rate and regular rhythm.  Pulmonary:     Effort: Pulmonary effort is normal.     Breath sounds: Normal breath sounds.  Neurological:     General: No focal deficit present.     Mental Status: She is alert and oriented to person, place, and time.  Psychiatric:        Mood and Affect: Mood normal.        Behavior: Behavior normal.         Assessment & Plan:   Problem List Items Addressed This Visit       Digestive   Gastroesophageal reflux disease without esophagitis - Primary     Other   History of gestational diabetes mellitus   Language barrier   Other Visit Diagnoses     Encounter to establish care           Pleasant 36 year old female here to establish care. In person medical translator present.  She has several specialists involved in her care.  Reviewed notes from GI, neurology, and cardiology.  Follow up here in 3-4 months for CPE  Return for f/u 3-4 months .   Harland Dingwall, NP-C

## 2022-03-28 ENCOUNTER — Ambulatory Visit (INDEPENDENT_AMBULATORY_CARE_PROVIDER_SITE_OTHER): Payer: 59 | Admitting: Physician Assistant

## 2022-03-28 ENCOUNTER — Telehealth: Payer: Self-pay | Admitting: *Deleted

## 2022-03-28 ENCOUNTER — Ambulatory Visit (INDEPENDENT_AMBULATORY_CARE_PROVIDER_SITE_OTHER): Payer: 59 | Admitting: Radiology

## 2022-03-28 ENCOUNTER — Encounter: Payer: Self-pay | Admitting: Physician Assistant

## 2022-03-28 VITALS — BP 114/76 | Temp 98.1°F

## 2022-03-28 VITALS — BP 100/70 | HR 87 | Ht 60.5 in | Wt 167.2 lb

## 2022-03-28 DIAGNOSIS — N644 Mastodynia: Secondary | ICD-10-CM | POA: Diagnosis not present

## 2022-03-28 DIAGNOSIS — R1013 Epigastric pain: Secondary | ICD-10-CM

## 2022-03-28 MED ORDER — HYOSCYAMINE SULFATE 0.125 MG SL SUBL: 0.1250 mg | SUBLINGUAL_TABLET | Freq: Four times a day (QID) | SUBLINGUAL | 2 refills | Status: DC | PRN

## 2022-03-28 MED ORDER — ESOMEPRAZOLE MAGNESIUM 40 MG PO CPDR: 40.0000 mg | DELAYED_RELEASE_CAPSULE | Freq: Two times a day (BID) | ORAL | 3 refills | Status: DC

## 2022-03-28 NOTE — Progress Notes (Signed)
   Monica Steele 1985-08-04 [id number]   History:  37 y.o. ([place] video interpreter used) G4P2 presents with complaints of breast pain in the upper outer quadrant of the left breast x 1 month. Denies any injury to the area, no changes to skin, no masses, no nipple d/c. Does report she drinks 1 cup of coffee a day, but this is not new for her,  Gynecologic History Patient's last menstrual period was 03/08/2022 (exact date).   Contraception/Family planning: none   Obstetric History OB History  Gravida Para Term Preterm AB Living  '4 2 2 '$ 0 2 2  SAB IAB Ectopic Multiple Live Births  2 0 0 0 2    # Outcome Date GA Lbr Len/2nd Weight Sex Delivery Anes PTL Lv  4 Term 06/02/21 [id number] 6 lb 9.5 oz (2.99 kg) F Vag-Spont EPI  LIV  3 Term 02/26/13 [id number] / 00:10 7 lb 1.6 oz (3.221 kg) M Vag-Spont EPI  LIV  2 SAB           1 SAB              The following portions of the patient's history were reviewed and updated as appropriate: allergies, current medications, past family history, past medical history, past social history, past surgical history, and problem list.  Review of Systems Pertinent items noted in HPI and remainder of comprehensive ROS otherwise negative.   Past medical history, past surgical history, family history and social history were all reviewed and documented in the EPIC chart.   Exam:  Vitals:   03/28/22 1032  BP: 114/76  Temp: 98.1 F (36.7 C)  TempSrc: Oral   There is no height or weight on file to calculate BMI.  General appearance:  Normal Thyroid:  Symmetrical, normal in size, without palpable masses or nodularity. Respiratory  Auscultation:  Clear without wheezing or rhonchi Cardiovascular  Auscultation:  Regular rate, without rubs, murmurs or gallops  Edema/varicosities:  Not grossly evident Breasts:breasts appear normal, no suspicious masses, no skin or nipple changes or axillary nodes.  + tenderness in left breast upper outer  quadrant  Patient informed chaperone available to be present for breast exam. Patient has requested no chaperone to be present.   Assessment/Plan:   1. Breast pain, left Vit E 400IU BID x 2 weeks Breast u/s ordered    CHRubbie Battiest WHNP-BC 10:47 AM 03/28/2022

## 2022-03-28 NOTE — Progress Notes (Signed)
Chief Complaint: GERD  HPI:    Monica Steele is a 37 year old [place] female with a past medical history as listed below including GERD, known to Dr. Tarri Steele, who presents to clinic today for complaint of GERD.    12/06/2021 patient seen in clinic by Monica Steele for lower abdominal pain.  At that time discussed she had previous extensive evaluation which was unrevealing including EGD, colonoscopy, CT scan, ultrasound and H. pylori breath test.  She was told to increase her MiraLAX and add 2 stool softeners at bedtime.  Discussed that if constipation was not resolved in 3 weeks and could try Amitiza 8 mcg twice daily.    Today, patient is here and exam is performed with the use of a video interpreter, to discuss her upper abdominal pain.  Explains that she is not currently taking her Nexium because she does not think it helps that much, apparently has been on and off of this over the past few years ever since been diagnosed with gastritis.  Does describe an upper abdominal pain which is worse 20 minutes after eating which seems to been getting worse over the past month, at its most is rated as an 8-9/10 and also causes nausea.  Typically last for 20 to 25 minutes.  Patient is worried something else is going on.    Denies fever, chills or weight loss.  Previous GI workup: March 2021 EGD and Colonoscopy EGD  -normal.  Colonoscopy - One 2 mm polyp in the sigmoid colon, removed with a cold snare. Resected and retrieved. - One 2 mm polyp at the hepatic flexure, removed with a cold snare. Resected and retrieved. - One 2 mm polyp in the rectum, removed with a cold snare. Resected and retrieved. - Non-bleeding internal hemorrhoids. - The examination was otherwise normal on direct and retroflexion views.   Diagnosis 1. Surgical [P], duodenum - BENIGN DUODENAL MUCOSA - NO ACUTE INFLAMMATION, VILLOUS BLUNTING OR INCREASED INTRAEPITHELIAL LYMPHOCYTES 2. Surgical [P], fundus, gastric antrum and  gastric body - MILD CHRONIC GASTRITIS WITHOUT ACTIVITY - NO H. PYLORI OR INTESTINAL METAPLASIA IDENTIFIED - SEE COMMENT 3. Surgical [P], distal esophagus - BENIGN SQUAMOUS MUCOSA - NO INCREASED INTRAEPITHELIAL EOSINOPHILS 4. Surgical [P], mid esophagus and proximal esophagus - BENIGN SQUAMOUS MUCOSA - NO INCREASED INTRAEPITHELIAL EOSINOPHILS 5. 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 This is the powder form     esomeprazole (NEXIUM) 40 MG capsule Take 1 capsule (40 mg total) by mouth daily at 12 noon. 30 capsule 5   rizatriptan (MAXALT-MLT) 10 MG disintegrating tablet Take 1 tablet (10 mg total) by mouth as needed for migraine. May repeat in 2 hours if needed (Patient not taking: Reported on 03/28/2022) 9 tablet 11   topiramate (TOPAMAX) 50 MG tablet Take 1 tablet (50 mg total) by mouth 2 (two) times daily. (Patient not taking: Reported  on 03/28/2022) 60 tablet 12   No current facility-administered medications for this visit.    Allergies as of 03/28/2022 - Review Complete 03/28/2022  Allergen Reaction Noted   Gadolinium derivatives Nausea And Vomiting 08/27/2017    Family History  Problem Relation Age of Onset   Healthy Mother    Healthy Father    Colon cancer Neg Hx    Esophageal cancer Neg Hx    Prostate cancer Neg Hx    Rectal cancer Neg Hx     Social History   Socioeconomic History    Marital status: Married    Spouse name: Not on file   Number of children: 2   Years of education: Not on file   Highest education level: Not on file  Occupational History   Not on file  Tobacco Use   Smoking status: Never   Smokeless tobacco: Never  Vaping Use   Vaping Use: Never used  Substance and Sexual Activity   Alcohol use: No   Drug use: No   Sexual activity: Yes    Birth control/protection: None  Other Topics Concern   Not on file  Social History Narrative   Lives home with husband, Monica Steele, and two children.  1 son and 1 daughter. She works as Engineer, civil (consulting) in [place], [place].  Education 8th grade.  Caffeine one [employer] daily.   Social Determinants of Health   Financial Resource Strain: Not on file  Food Insecurity: Not on file  Transportation Needs: Not on file  Physical Activity: Not on file  Stress: Not on file  Social Connections: Not on file  Intimate Partner Violence: Not on file    Review of Systems:    Constitutional: No weight loss, fever or chills Cardiovascular: No chest pain   Respiratory: No SOB Gastrointestinal: See HPI and otherwise negative   Physical Exam:  Vital signs: BP 100/70   Pulse 87   Ht 5' 0.5" (1.537 m)   Wt 167 lb 3.2 oz (75.8 kg)   LMP 03/08/2022   SpO2 96%   BMI 32.12 kg/m   Constitutional:   Pleasant [place] female appears to be in NAD, Well developed, Well nourished, alert and cooperative Respiratory: Respirations even and unlabored. Lungs clear to auscultation bilaterally.   No wheezes, crackles, or rhonchi.  Cardiovascular: Normal S1, S2. No MRG. Regular rate and rhythm. No peripheral edema, cyanosis or pallor.  Gastrointestinal:  Soft, nondistended, mod epigastric ttp. No rebound or guarding. Normal bowel sounds. No appreciable masses or hepatomegaly. Rectal:  Not performed.  Psychiatric: Demonstrates good judgement and reason without abnormal affect or behaviors.  RELEVANT LABS AND IMAGING: CBC    Component Value  Date/Time   WBC 7.3 11/15/2021 1336   RBC 5.19 (H) 11/15/2021 1336   HGB 14.4 11/15/2021 1336   HGB 14.2 12/23/2019 1208   HCT 44.6 11/15/2021 1336   HCT 43.1 12/23/2019 1208   PLT 340 11/15/2021 1336   PLT 360 12/18/2016 1003   MCV 85.9 11/15/2021 1336   MCV 89 12/23/2019 1208   MCH 27.7 11/15/2021 1336   MCHC 32.3 11/15/2021 1336   RDW 12.1 11/15/2021 1336   RDW 11.9 12/23/2019 1208   LYMPHSABS 1.4 06/30/2020 0909   LYMPHSABS 2.1 12/23/2019 1208   MONOABS 0.9 06/30/2020 0909   EOSABS 0.1 06/30/2020 0909   EOSABS 0.1 12/23/2019 1208   BASOSABS 0.0 06/30/2020 0909   BASOSABS 0.0 12/23/2019 1208    CMP     Component Value Date/Time  NA 137 11/15/2021 1336   NA 139 12/23/2019 1208   K 3.8 11/15/2021 1336   CL 104 11/15/2021 1336   CO2 27 11/15/2021 1336   GLUCOSE 147 (H) 11/15/2021 1336   BUN 14 11/15/2021 1336   BUN 12 12/23/2019 1208   CREATININE 0.66 11/15/2021 1336   CALCIUM 9.2 11/15/2021 1336   PROT 8.2 06/30/2020 0909   PROT 7.3 12/23/2019 1208   ALBUMIN 4.3 06/30/2020 0909   ALBUMIN 4.5 12/23/2019 1208   AST 11 06/30/2020 0909   ALT 12 06/30/2020 0909   ALKPHOS 76 06/30/2020 0909   BILITOT 0.4 06/30/2020 0909   BILITOT 0.2 12/23/2019 1208   GFRNONAA >60 11/15/2021 1336   GFRAA 135 12/23/2019 1208    Assessment: 1.  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  Reviewed all of patient's previous testing.  I do not think she needs emergent imaging or repeat EGD. 2.  Will start with increasing her Nexium to 40 mg twice a day, 30-60 minutes before breakfast and dinner.  Prescribed #60 with 5 refills. 3.  Also prescribed Hyoscyamine 0.125 mg sublingual tabs 1 tab every 4-6 hours as needed for pain #30 with 3 refills. 4.  Discussed with patient that if her symptoms are no better in a month she can call us back and would recommend an EGD at that point.   She verbalized understanding. 5.  Patient scheduled a 41-monthfollow-up with Dr. BTarri Steele  JEllouise Newer PA-C [facility] 03/28/2022, 9:10 AM

## 2022-03-28 NOTE — Telephone Encounter (Signed)
-----   Message from Kerry Dory, NP sent at 03/28/2022 10:46 AM EST ----- Regarding: Breast pain Please schedule breast u/s of left breast, pain upper outer quadrant, no masses or skin changes

## 2022-03-28 NOTE — Telephone Encounter (Signed)
Interpretor service called pt and left her a message to return our call in the triage department.   I would like to know if patient has ever had a mammogram.   Per protocol if over 30 never had Mammo do bilateral. If patient has had previous Mammo GI would need copy of the films before scheduling

## 2022-03-28 NOTE — Patient Instructions (Addendum)
_______________________________________________________  N?u b?n t? 72 tu?i tr? ln, ch? s? kh?i c? th? c?a b?n ph?i n?m trong kho?ng 23-30. Ch? s? kh?i c? th? c?a b?n l 32,12 kg/m2. N?u con s? ny n?m ngoi ph?m vi nu trn ???c li?t k, vui lng cn nh?c lin h? v?i Nh cung c?p d?ch v? ch?m [place] chnh c?a b?n.  N?u b?n t? 52 tu?i tr? xu?ng, ch? s? kh?i c? th? c?a b?n ph?i n?m trong kho?ng 19-25. Ch? s? kh?i c? th? c?a b?n l 32,12 kg/m2. N?u con s? ny n?m ngoi ph?m vi ???c li?t k ? trn, vui lng cn nh?c lin h? v?i Nh cung c?p d?ch v? ch?m [place] chnh c?a b?n.  Chng ti ? g?i cc lo?i thu?c sau ??n nh thu?c ?? b?n nh?n khi thu?n ti?n: Nexium 40 mg hai l?n m?i ngy, Hyoscyamine 0,125 mg m?i 4-6 gi? n?u c?n.    H?n ti khm v?i Dr.Beavers lc 9:30 sng ____________________________________________________________  Cc nh cung c?p [facility] mu?n khuy?n khch b?n s? d?ng MYCHART ?? lin l?c v?i cc nh cung c?p v? cc yu c?u ho?c th?c m?c khng kh?n c?p. Do th?i gian ch? ?i?n tho?i qu lu nn vi?c g?i tin nh?n cho nh cung c?p c?a b?n b?ng MYCHART c th? l cch nhanh h?n v hi?u qu? h?n ?? nh?n ???c ph?n h?i. Vui lng ch? 48 gi? lm vi?c ?? nh?n ???c ph?n h?i. Hy nh? r?ng ?y l nh?ng yu c?u khng kh?n c?p.  Th?t vui ???c g?p b?n ngy hm nay!  C?m ?n b?n ? tin t??ng giao d?ch v? ch?m [place] d? dy cho ti!

## 2022-03-28 NOTE — Progress Notes (Signed)
Reviewed and agree with management plans. ? ?Siri Buege L. [place] Graefe, MD, MPH  ?

## 2022-03-29 ENCOUNTER — Other Ambulatory Visit: Payer: Self-pay

## 2022-03-29 ENCOUNTER — Telehealth: Payer: Self-pay | Admitting: Physician Assistant

## 2022-03-29 MED ORDER — HYOSCYAMINE SULFATE 0.125 MG SL SUBL: 0.1250 mg | SUBLINGUAL_TABLET | SUBLINGUAL | 2 refills | Status: DC | PRN

## 2022-03-29 NOTE — Telephone Encounter (Signed)
Patient was seen by Anderson Malta, [place] on 03/28/22

## 2022-03-29 NOTE — Telephone Encounter (Signed)
PT went to pick up new RX for Hyoscyamine and was told it was never sent to pharmacy. Please advise

## 2022-03-29 NOTE — Telephone Encounter (Signed)
Resent the medication to patient's pharmacy after it was canceled by another practice after medication was sent in. Confirmed patient's pharmacy received medication and patient was informed.

## 2022-03-29 NOTE — Telephone Encounter (Signed)
Per [facility] notes from 07/2020: pt had never had mammogram/breast imaging. No imaging seen in EMR or in care everywhere.   Will place order and fax to [facility].

## 2022-03-29 NOTE — Telephone Encounter (Signed)
Order faxed successfully, will give to MR to place order in EMR.

## 2022-04-05 NOTE — Telephone Encounter (Signed)
FYI. Pt scheduled for 04/11/2022 @ 10:45.

## 2022-04-11 ENCOUNTER — Encounter: Payer: Self-pay | Admitting: Radiology

## 2022-04-12 ENCOUNTER — Encounter: Payer: Self-pay | Admitting: Radiology

## 2022-05-23 ENCOUNTER — Ambulatory Visit (INDEPENDENT_AMBULATORY_CARE_PROVIDER_SITE_OTHER): Payer: 59 | Admitting: Gastroenterology

## 2022-05-23 ENCOUNTER — Encounter: Payer: Self-pay | Admitting: Gastroenterology

## 2022-05-23 VITALS — BP 100/64 | HR 88 | Ht 60.0 in | Wt 169.0 lb

## 2022-05-23 DIAGNOSIS — R1013 Epigastric pain: Secondary | ICD-10-CM

## 2022-05-23 NOTE — Patient Instructions (Signed)
Ti?p t?c Nexium 40 mg BID  Ti?p t?c u?ng hyoscyamine tr??c b?a ?n  Ti?p t?c Miralax PRN  Trnh t?t c? cc NSAID  HIDA v?i CCK - B?n ? ???c ln l?ch ch?p HIDA t?i [facility] (t?ng 1) trn Monday 06/05/22 . Vui lng ??n tr??c gi? h?n 30 pht t?i 8 am. ??m b?o khng ?n ho?c u?ng b?t c? th? g t nh?t 6 gi? tr??c khi xt nghi?m. N?u ngy ho?c gi? h?n ny khng ph h?p v?i b?n, vui lng g?i ??t l?ch ch?p X quang theo s? [phone]. ___________________________________________________________________ Monica Steele m?t (HIDA) l m?t th? t?c hnh ?nh ???c s? d?ng ?? ch?n ?on cc v?n ?? ? gan, ti m?t v ?ng m?t. Trong qu trnh qut HIDA, m?t ch?t phng x? ho?c ch?t ?nh d?u ???c tim vo t?nh m?ch ? cnh tay c?a b?n. Ch?t ?nh d?u ???c gan x? l nh? m?t. M?t l m?t ch?t l?ng ???c gan s?n xu?t v bi ti?t, gip h? tiu ha phn h?y ch?t bo trong th?c ph?m b?n ?n. M?t ???c l?u tr? trong ti m?t v ti m?t s? ti?t ra m?t khi b?n ?n. M?t my qut y h?c h?t nhn ??c bi?t (my ?nh gamma) theo di dng ch?t ?nh d?u t? gan vo ti m?t v ru?t non c?a b?n. Trong qu trnh qut HIDA c?a b?n B?n s? ???c yu c?u thay o chong b?nh vi?n tr??c khi qu trnh qut HIDA b?t ??u. ??i ng? ch?m Monica Steele s?c kh?e c?a b?n s? ??t b?n trn bn, th??ng l n?m ng?a. Ch?t ?nh d?u phng x? sau ? ???c tim vo t?nh m?ch ? cnh tay c?a b?n. Ch?t ?nh d?u ?i qua dng mu ??n gan, n?i n ???c cc t? bo s?n xu?t m?t h?p th?. Ch?t ?nh d?u phng x? di chuy?n theo m?t t? gan vo ti m?t v qua ?ng m?t ??n ru?t non. B?n c th? c?m th?y p l?c trong khi ch?t ?nh d?u phng x? ???c tim vo t?nh m?ch. Khi b?n n?m trn bn, m?t camera gamma ??c bi?t s? ???c ??t trn b?ng b?n ?? ch?p ?nh ch?t ?nh d?u khi n di chuy?n trong c? th? b?n. Camera gamma ch?p ?nh lin t?c trong kho?ng m?t gi?Marland Kitchen B?n s? c?n gi? yn trong qu trnh qut HIDA. ?i?u ny c th? tr? nn kh ch?u, nh?ng b?n c th? th?y r?ng mnh c th? gi?m b?t s? kh ch?u b?ng cch ht th?  su v suy ngh? v? nh?ng ?i?u khc. Hy cho nhm ch?m Monica Steele s?c kh?e c?a b?n bi?t n?u b?n khng tho?i mi. Bc s? X quang s? theo di trn my tnh ti?n trnh c?a ch?t ?nh d?u phng x? trong c? th? b?n. Monica Steele trnh qut HIDA c th? b? d?ng khi nhn th?y ch?t ?nh d?u phng x? trong ti m?t v ?i vo ru?t non c?a b?n. Vi?c ny th??ng m?t kho?ng m?t gi?Marland Kitchen Trong m?t s? tr??ng h?p, hnh ?nh b? sung s? ???c th?c hi?n n?u hnh ?nh g?c khng ??t yu c?u, n?u dng morphin ?? gip hnh dung ti m?t ho?c n?u dng thu?c CCK ?? xem xt s? co bp c?a ti m?t. Bi ki?m tra ny th??ng m?t 2 gi? ?? hon thnh. ____________________________________________________________________________  Monica Steele t?i v?n phng sau HIDA, s?m h?n n?u c?n   Continue Nexium 40 mg BID  Continue hyoscyamine taken prior to meals  Continue Miralax PRN  Avoid all NSAIDs  HIDA with CCK - You have been scheduled  for a HIDA scan at [facility] (1st floor) on Monday 06/05/22. Please arrive 30 minutes prior to your scheduled appointment at  8 am. Make certain not to have anything to eat or drink at least 6 hours prior to your test. Should this appointment date or time not work well for you, please call radiology scheduling at [phone].  _____________________________________________________________________ hepatobiliary (HIDA) scan is an imaging procedure used to diagnose problems in the liver, gallbladder and bile ducts. In the HIDA scan, a radioactive chemical or tracer is injected into a vein in your arm. The tracer is handled by the liver like bile. Bile is a fluid produced and excreted by your liver that helps your digestive system break down fats in the foods you eat. Bile is stored in your gallbladder and the gallbladder releases the bile when you eat a meal. A special nuclear medicine scanner (gamma camera) tracks the flow of the tracer from your liver into your gallbladder and small intestine.  During your HIDA scan  You'll be  asked to change into a hospital gown before your HIDA scan begins. Your health care team will position you on a table, usually on your back. The radioactive tracer is then injected into a vein in your arm.The tracer travels through your bloodstream to your liver, where it's taken up by the bile-producing cells. The radioactive tracer travels with the bile from your liver into your gallbladder and through your bile ducts to your small intestine.You may feel some pressure while the radioactive tracer is injected into your vein. As you lie on the table, a special gamma camera is positioned over your abdomen taking pictures of the tracer as it moves through your body. The gamma camera takes pictures continually for about an hour. You'll need to keep still during the HIDA scan. This can become uncomfortable, but you may find that you can lessen the discomfort by taking deep breaths and thinking about other things. Tell your health care team if you're uncomfortable. The radiologist will watch on a computer the progress of the radioactive tracer through your body. The HIDA scan may be stopped when the radioactive tracer is seen in the gallbladder and enters your small intestine. This typically takes about an hour. In some cases extra imaging will be performed if original images aren't satisfactory, if morphine is given to help visualize the gallbladder or if the medication CCK is given to look at the contraction of the gallbladder. This test typically takes 2 hours to complete. ________________________________________________________________________  Follow-up in the office after the HIDA, earlier if needed

## 2022-05-23 NOTE — Progress Notes (Signed)
Referring Provider: Girtha Rm, NP-C Primary Care Physician:  Girtha Rm, NP-C  Chief complaint:  Periumbilical abdominal pain   IMPRESSION:  Predominantly post-prandial upper abdominal pain with history of H pylori negative gastritis and gastric erosion: Symptoms now present for >2 years.  No other source identified on ultrasound, CT, EGD, or colonoscopy. Liver enzymes and pancreatic enzymes have been normal. Must consider symptomatic gallbladder disease, although symptoms are slightly atypical. I have encouraged her to use the hyoscyamine more regularly between now and a HIDA scan. Will focus on treatment of functional dyspepsia if HIDA is negative.   Intermittent Reflux: Incomplete relief on Nexium QD. No alarm features.   Constipation: She does not feel this is related to abdominal pain. Although I wonder if her abdominal pain would improve with improved constipation.   Tubular adenoma of colon 2 tubular adenomas removed on colonoscopy 05/21/2019.  Surveillance colonoscopy recommended in 2028.  Gastric polyp: Small polyp seen on ultrasound and CT. No follow-up needed.   No family history of esophageal cancer, stomach cancer, or colon cancer or polyps  PLAN: - Continue Nexium 40 mg BID - Continue hyoscyamine taken prior to meals - Avoid all NSAIDs - HIDA with CCK - Consider trial of TCA or SSRI in the future - Continue Miralax PRN - Follow-up in the office after the HIDA, earlier if needed  Please see the "Patient Instructions" section for addition details about the plan.  HPI: Monica Steele is a 38 y.o. female who returns with ongoing abdominal pain.  The interval history is obtained through the patient with the assistance of a [place] interpreter via iPAD.  She was last seen by 03/29/22 for ongoing complaints of abdominal pain, constipation and reflux. I last saw her in the office in 2022.  Returns today in follow-up of abdominal pain that has been present  since 2022. Evaluation has included CT scan, EGD, colonoscopy, esophagram and abdominal ultrasound over the last few years.  CT scan showed a small (64m) gallbladder polyp but was otherwise normal.  Reports ongoing pain, bloating and eructation after eating despite using Nexium. Worsened with eating and particularly after eating spicy and greasy foods. Pain starts within minutes and will last 10-15 minutes.  She reports 7-9 out of 10 when she pushes on her epigastrium. She previously had constipation but this has since resolved. Does not feel that the pain and constipation are related.  No alarm features.   No improvement with Nexium, stool softeners, Miralax, intermittent use of dicyclomine.  Endoscopic history: - EGD was normal in 05/2019.  Gastric biopsies revealed some gastritis, negative for H. pylori.  Esophageal and duodenal biopsies were normal.  - Colonoscopy in 05/2019 revealed 3 polyps that were removed and internal hemorrhoids.  2 of the 3 polyps were tubular adenomas and one with hyperplastic polyp.    Past Medical History:  Diagnosis Date   Allergy    Dry eyes    Gallbladder polyp    GERD (gastroesophageal reflux disease)    H/O seasonal allergies    Headache     Past Surgical History:  Procedure Laterality Date   COLONOSCOPY  2021   ESOPHAGOGASTRODUODENOSCOPY ENDOSCOPY  2021    Current Outpatient Medications  Medication Sig Dispense Refill   esomeprazole (NEXIUM) 40 MG capsule Take 1 capsule (40 mg total) by mouth 2 (two) times daily. 60 capsule 3   EYSUVIS 0.25 % SUSP Apply 1 drop to eye 2 (two) times daily.  hyoscyamine (LEVSIN SL) 0.125 MG SL tablet Place 1 tablet (0.125 mg total) under the tongue every 4 (four) hours as needed. 30 tablet 2   rizatriptan (MAXALT-MLT) 10 MG disintegrating tablet Take 10 mg by mouth as needed.     topiramate (TOPAMAX) 50 MG tablet Take 50 mg by mouth as needed.     XIIDRA 5 % SOLN Apply 1 drop to eye 2 (two) times daily.     No  current facility-administered medications for this visit.    Allergies as of 05/23/2022 - Review Complete 05/23/2022  Allergen Reaction Noted   Gadolinium derivatives Nausea And Vomiting 08/27/2017    Family History  Problem Relation Age of Onset   Healthy Mother    Healthy Father    Colon cancer Neg Hx    Esophageal cancer Neg Hx    Prostate cancer Neg Hx    Rectal cancer Neg Hx        Physical Exam: General:   Alert,  well-nourished, pleasant and cooperative in NAD Head:  Normocephalic and atraumatic. Eyes:  Sclera clear, no icterus.   Conjunctiva pink. Abdomen:  Soft, nontender, nondistended, normal bowel sounds, no rebound or guarding. No hepatosplenomegaly.  She localizes the pain the RUQ but I am unable to reproduce it.  Neurologic:  Alert and  oriented x4;  grossly nonfocal Skin:  Intact without significant lesions or rashes. Psych:  Alert and cooperative. Normal mood and affect.   Luca Dyar L. Tarri Glenn, MD, MPH 05/23/2022, 10:36 AM

## 2022-05-25 ENCOUNTER — Ambulatory Visit: Payer: 59 | Admitting: Gastroenterology

## 2022-06-05 ENCOUNTER — Ambulatory Visit: Payer: 59 | Admitting: Radiology

## 2022-06-05 ENCOUNTER — Encounter (HOSPITAL_COMMUNITY)
Admission: RE | Admit: 2022-06-05 | Discharge: 2022-06-05 | Disposition: A | Discharge status: Home / Self Care | Payer: 59 | Source: Ambulatory Visit | Attending: Gastroenterology | Admitting: Gastroenterology

## 2022-06-05 DIAGNOSIS — R1013 Epigastric pain: Secondary | ICD-10-CM | POA: Insufficient documentation

## 2022-06-05 MED ORDER — TECHNETIUM TC 99M MEBROFENIN IV KIT
5.5000 | PACK | Freq: Once | INTRAVENOUS | Status: AC | PRN
  Administered 2022-06-05: 5.5 via INTRAVENOUS

## 2022-06-29 DIAGNOSIS — K828 Other specified diseases of gallbladder: Secondary | ICD-10-CM | POA: Diagnosis not present

## 2022-07-14 ENCOUNTER — Telehealth: Payer: Self-pay | Admitting: Cardiology

## 2022-07-14 NOTE — Telephone Encounter (Signed)
OK with me Chris  

## 2022-07-14 NOTE — Telephone Encounter (Signed)
Pt would like to do a provider Switch  From: Bjorn Pippin  To: Shari Prows

## 2022-07-21 NOTE — Progress Notes (Signed)
Sent message, via epic in basket, requesting orders in epic from surgeon.  

## 2022-07-21 NOTE — Progress Notes (Signed)
Anesthesia Review:  PCP: Cardiologist : Chest x-ray : EKG : 11/16/21  Echo : 10/05/2021  Stress test: Cardiac Cath :  Activity level:  Sleep Study/ CPAP : Fasting Blood Sugar :      / Checks Blood Sugar -- times a day:   Blood Thinner/ Instructions /Last Dose: ASA / Instructions/ Last Dose :

## 2022-07-21 NOTE — Patient Instructions (Signed)
SURGICAL WAITING ROOM VISITATION  Patients having surgery or a procedure may have no more than 2 support people in the waiting area - these visitors may rotate.    Children under the age of 13 must have an adult with them who is not the patient.  Due to an increase in RSV and influenza rates and associated hospitalizations, children ages 20 and under may not visit patients in [facility] hospitals.  If the patient needs to stay at the hospital during part of their recovery, the visitor guidelines for inpatient rooms apply. Pre-op nurse will coordinate an appropriate time for 1 support person to accompany patient in pre-op.  This support person may not rotate.    Please refer to the [facility] website for the visitor guidelines for Inpatients (after your surgery is over and you are in a regular room).       Your procedure is scheduled on:  07/31/22    Report to [facility] Main Entrance    Report to admitting at  0700 AM   Call this number if you have problems the morning of surgery [phone]   Do not eat food or drink liquids  :After Midnight.                                 Oral Hygiene is also important to reduce your risk of infection.                                    Remember - BRUSH YOUR TEETH THE MORNING OF SURGERY WITH YOUR REGULAR TOOTHPASTE  DENTURES WILL BE REMOVED PRIOR TO SURGERY PLEASE DO NOT APPLY "Poly grip" OR ADHESIVES!!!   Do NOT smoke after Midnight   Take these medicines the morning of surgery with A SIP OF WATER:   none                                  You may not have any metal on your body including hair pins, jewelry, and body piercing             Do not wear make-up, lotions, powders, perfumes/cologne, or deodorant  Do not wear nail polish including gel and S&S, artificial/acrylic nails, or any other type of covering on natural nails including finger and toenails. If you have artificial nails, gel coating, etc. that needs to  be removed by a nail salon please have this removed prior to surgery or surgery may need to be canceled/ delayed if the surgeon/ anesthesia feels like they are unable to be safely monitored.   Do not shave  48 hours prior to surgery.               Men   Do not bring valuables to the hospital. [facility] IS NOT             RESPONSIBLE   FOR VALUABLES.   Contacts, glasses, dentures or bridgework may not be worn into surgery.   Bring small overnight bag day of surgery.   DO NOT BRING YOUR HOME MEDICATIONS TO THE HOSPITAL. PHARMACY WILL DISPENSE MEDICATIONS LISTED ON YOUR MEDICATION LIST TO YOU DURING YOUR ADMISSION IN THE HOSPITAL!    Patients discharged on the day of surgery will not be allowed to drive home.  Someone NEEDS to stay with you for the first 24 hours after anesthesia.   Special Instructions: Bring a copy of your healthcare power of attorney and living will documents the day of surgery if you haven't scanned them before.              Please read over the following fact sheets you were given: IF YOU HAVE QUESTIONS ABOUT YOUR PRE-OP INSTRUCTIONS PLEASE CALL [phone]   If you received a COVID test during your pre-op visit  it is requested that you wear a mask when out in public, stay away from anyone that may not be feeling well and notify your surgeon if you develop symptoms. If you test positive for Covid or have been in contact with anyone that has tested positive in the last 10 days please notify you surgeon.    [facility] - Preparing for Surgery Before surgery, you can play an important role.  Because skin is not sterile, your skin needs to be as free of germs as possible.  You can reduce the number of germs on your skin by washing with CHG (chlorahexidine gluconate) soap before surgery.  CHG is an antiseptic cleaner which kills germs and bonds with the skin to continue killing germs even after washing. Please DO NOT use if you have an allergy to CHG or antibacterial  soaps.  If your skin becomes reddened/irritated stop using the CHG and inform your nurse when you arrive at Short Stay. Do not shave (including legs and underarms) for at least 48 hours prior to the first CHG shower.  You may shave your face/neck. Please follow these instructions carefully:  1.  Shower with CHG Soap the night before surgery and the  morning of Surgery.  2.  If you choose to wash your hair, wash your hair first as usual with your  normal  shampoo.  3.  After you shampoo, rinse your hair and body thoroughly to remove the  shampoo.                           4.  Use CHG as you would any other liquid soap.  You can apply chg directly  to the skin and wash                       Gently with a scrungie or clean washcloth.  5.  Apply the CHG Soap to your body ONLY FROM THE NECK DOWN.   Do not use on face/ open                           Wound or open sores. Avoid contact with eyes, ears mouth and genitals (private parts).                       Wash face,  Genitals (private parts) with your normal soap.             6.  Wash thoroughly, paying special attention to the area where your surgery  will be performed.  7.  Thoroughly rinse your body with warm water from the neck down.  8.  DO NOT shower/wash with your normal soap after using and rinsing off  the CHG Soap.                9.  Pat yourself dry with a clean towel.  10.  Wear clean pajamas.            11.  Place clean sheets on your bed the night of your first shower and do not  sleep with pets. Day of Surgery : Do not apply any lotions/deodorants the morning of surgery.  Please wear clean clothes to the hospital/surgery center.  FAILURE TO FOLLOW THESE INSTRUCTIONS MAY RESULT IN THE CANCELLATION OF YOUR SURGERY PATIENT SIGNATURE_________________________________  NURSE SIGNATURE__________________________________  ________________________________________________________________________

## 2022-07-25 ENCOUNTER — Other Ambulatory Visit: Payer: Self-pay

## 2022-07-25 ENCOUNTER — Encounter (HOSPITAL_COMMUNITY)
Admission: RE | Admit: 2022-07-25 | Discharge: 2022-07-25 | Disposition: A | Discharge status: Home / Self Care | Payer: 59 | Source: Ambulatory Visit | Attending: Surgery | Admitting: Surgery

## 2022-07-25 ENCOUNTER — Encounter (HOSPITAL_COMMUNITY): Payer: Self-pay

## 2022-07-25 ENCOUNTER — Ambulatory Visit: Payer: Self-pay | Admitting: Surgery

## 2022-07-25 VITALS — BP 121/76 | HR 78 | Temp 98.7°F | Resp 16 | Ht 62.0 in | Wt 166.0 lb

## 2022-07-25 DIAGNOSIS — Z01812 Encounter for preprocedural laboratory examination: Secondary | ICD-10-CM | POA: Diagnosis not present

## 2022-07-25 DIAGNOSIS — Z01818 Encounter for other preprocedural examination: Secondary | ICD-10-CM

## 2022-07-25 LAB — CBC
HCT: 45.1 % (ref 36.0–46.0)
Hemoglobin: 14.5 g/dL (ref 12.0–15.0)
MCH: 28.1 pg (ref 26.0–34.0)
MCHC: 32.2 g/dL (ref 30.0–36.0)
MCV: 87.4 fL (ref 80.0–100.0)
Platelets: 349 10*3/uL (ref 150–400)
RBC: 5.16 MIL/uL — ABNORMAL HIGH (ref 3.87–5.11)
RDW: 12.1 % (ref 11.5–15.5)
WBC: 9.2 10*3/uL (ref 4.0–10.5)
nRBC: 0 % (ref 0.0–0.2)

## 2022-07-28 NOTE — Progress Notes (Addendum)
Called pt using interpreter to update of surgery schedule time. Attempted twice with no answer. Left voicemail stating new scheduled surgery time and time of arrival.

## 2022-07-30 NOTE — Anesthesia Preprocedure Evaluation (Signed)
Anesthesia Evaluation  Patient identified by MRN, date of birth, ID band Patient awake    Reviewed: Allergy & Precautions, NPO status , Patient's Chart, lab work & pertinent test results  Airway Mallampati: II  TM Distance: >3 FB Neck ROM: Full    Dental no notable dental hx. (+) Teeth Intact, Dental Advisory Given   Pulmonary neg pulmonary ROS   Pulmonary exam normal breath sounds clear to auscultation       Cardiovascular negative cardio ROS Normal cardiovascular exam Rhythm:Regular Rate:Normal     Neuro/Psych negative neurological ROS  negative psych ROS   GI/Hepatic Neg liver ROS,GERD  ,,  Endo/Other  negative endocrine ROS    Renal/GU negative Renal ROS  negative genitourinary   Musculoskeletal negative musculoskeletal ROS (+)    Abdominal   Peds  Hematology negative hematology ROS (+) Lab Results      Component                Value               Date                      WBC                      9.2                 07/25/2022                HGB                      14.5                07/25/2022                HCT                      45.1                07/25/2022                MCV                      87.4                07/25/2022                PLT                      349                 07/25/2022              Anesthesia Other Findings   Reproductive/Obstetrics negative OB ROS                             Anesthesia Physical Anesthesia Plan  ASA: 2  Anesthesia Plan: General   Post-op Pain Management: Toradol IV (intra-op)*, Tylenol PO (pre-op)* and Precedex   Induction: Intravenous  PONV Risk Score and Plan: Treatment may vary due to age or medical condition, Midazolam, Ondansetron and Dexamethasone  Airway Management Planned: Oral ETT  Additional Equipment: None  Intra-op Plan:   Post-operative Plan: Extubation in OR  Informed Consent: I have reviewed the  patients History and Physical, chart, labs and discussed the procedure including the risks,  benefits and alternatives for the proposed anesthesia with the patient or authorized representative who has indicated his/her understanding and acceptance.     Interpreter used for [employer] and Sales promotion account executive given  Plan Discussed with:   Anesthesia Plan Comments: (Vietnamese speaking)        Anesthesia Quick Evaluation

## 2022-07-31 ENCOUNTER — Other Ambulatory Visit: Payer: Self-pay

## 2022-07-31 ENCOUNTER — Ambulatory Visit (HOSPITAL_COMMUNITY): Payer: 59 | Admitting: Anesthesiology

## 2022-07-31 ENCOUNTER — Ambulatory Visit (HOSPITAL_BASED_OUTPATIENT_CLINIC_OR_DEPARTMENT_OTHER): Payer: 59 | Admitting: Anesthesiology

## 2022-07-31 ENCOUNTER — Ambulatory Visit (HOSPITAL_COMMUNITY)
Admission: RE | Admit: 2022-07-31 | Discharge: 2022-07-31 | Disposition: A | Discharge status: Home / Self Care | Payer: 59 | Source: Ambulatory Visit | Attending: Surgery | Admitting: Surgery

## 2022-07-31 ENCOUNTER — Encounter (HOSPITAL_COMMUNITY): Payer: Self-pay | Admitting: Surgery

## 2022-07-31 ENCOUNTER — Encounter (HOSPITAL_COMMUNITY)
Admission: RE | Disposition: A | Discharge status: Home / Self Care | Payer: Self-pay | Source: Ambulatory Visit | Attending: Surgery

## 2022-07-31 DIAGNOSIS — K219 Gastro-esophageal reflux disease without esophagitis: Secondary | ICD-10-CM | POA: Diagnosis not present

## 2022-07-31 DIAGNOSIS — K828 Other specified diseases of gallbladder: Secondary | ICD-10-CM

## 2022-07-31 DIAGNOSIS — K811 Chronic cholecystitis: Secondary | ICD-10-CM | POA: Diagnosis not present

## 2022-07-31 DIAGNOSIS — Z01818 Encounter for other preprocedural examination: Secondary | ICD-10-CM

## 2022-07-31 HISTORY — PX: CHOLECYSTECTOMY: SHX55

## 2022-07-31 LAB — POCT PREGNANCY, URINE: Preg Test, Ur: NEGATIVE

## 2022-07-31 SURGERY — LAPAROSCOPIC CHOLECYSTECTOMY
Anesthesia: General

## 2022-07-31 MED ORDER — OXYCODONE HCL 5 MG PO TABS
5.0000 mg | ORAL_TABLET | Freq: Once | ORAL | Status: AC | PRN
  Administered 2022-07-31: 5 mg via ORAL

## 2022-07-31 MED ORDER — KETOROLAC TROMETHAMINE 30 MG/ML IJ SOLN
INTRAMUSCULAR | Status: AC
  Filled 2022-07-31: qty 1

## 2022-07-31 MED ORDER — 0.9 % SODIUM CHLORIDE (POUR BTL) OPTIME
TOPICAL | Status: DC | PRN
  Administered 2022-07-31: 1000 mL

## 2022-07-31 MED ORDER — SUGAMMADEX SODIUM 200 MG/2ML IV SOLN
INTRAVENOUS | Status: DC | PRN
  Administered 2022-07-31: 200 mg via INTRAVENOUS

## 2022-07-31 MED ORDER — ORAL CARE MOUTH RINSE: 15.0000 mL | Freq: Once | OROMUCOSAL | Status: AC

## 2022-07-31 MED ORDER — METHOCARBAMOL 750 MG PO TABS: 750.0000 mg | ORAL_TABLET | Freq: Four times a day (QID) | ORAL | 1 refills | Status: DC

## 2022-07-31 MED ORDER — ROCURONIUM BROMIDE 10 MG/ML (PF) SYRINGE
PREFILLED_SYRINGE | INTRAVENOUS | Status: DC | PRN
  Administered 2022-07-31: 60 mg via INTRAVENOUS

## 2022-07-31 MED ORDER — LIDOCAINE 2% (20 MG/ML) 5 ML SYRINGE
INTRAMUSCULAR | Status: DC | PRN
  Administered 2022-07-31: 100 mg via INTRAVENOUS

## 2022-07-31 MED ORDER — DEXAMETHASONE SODIUM PHOSPHATE 10 MG/ML IJ SOLN
INTRAMUSCULAR | Status: DC | PRN
  Administered 2022-07-31: 4 mg via INTRAVENOUS

## 2022-07-31 MED ORDER — DOCUSATE SODIUM 100 MG PO CAPS: 100.0000 mg | ORAL_CAPSULE | Freq: Two times a day (BID) | ORAL | 2 refills | Status: AC

## 2022-07-31 MED ORDER — IBUPROFEN 600 MG PO TABS: 600.0000 mg | ORAL_TABLET | Freq: Four times a day (QID) | ORAL | 1 refills | Status: DC

## 2022-07-31 MED ORDER — ONDANSETRON 4 MG PO TBDP
4.0000 mg | ORAL_TABLET | Freq: Once | ORAL | Status: AC
  Administered 2022-07-31: 4 mg via ORAL

## 2022-07-31 MED ORDER — ONDANSETRON HCL 4 MG/2ML IJ SOLN
INTRAMUSCULAR | Status: DC | PRN
  Administered 2022-07-31: 4 mg via INTRAVENOUS

## 2022-07-31 MED ORDER — ONDANSETRON HCL 4 MG/2ML IJ SOLN
INTRAMUSCULAR | Status: AC
  Filled 2022-07-31: qty 2

## 2022-07-31 MED ORDER — ACETAMINOPHEN 500 MG PO TABS: 1000.0000 mg | ORAL_TABLET | Freq: Four times a day (QID) | ORAL | 3 refills | Status: AC

## 2022-07-31 MED ORDER — ROCURONIUM BROMIDE 10 MG/ML (PF) SYRINGE
PREFILLED_SYRINGE | INTRAVENOUS | Status: AC
  Filled 2022-07-31: qty 10

## 2022-07-31 MED ORDER — BUPIVACAINE-EPINEPHRINE (PF) 0.25% -1:200000 IJ SOLN
INTRAMUSCULAR | Status: AC
  Filled 2022-07-31: qty 30

## 2022-07-31 MED ORDER — ONDANSETRON HCL 4 MG/2ML IJ SOLN: 4.0000 mg | Freq: Once | INTRAMUSCULAR | Status: DC | PRN

## 2022-07-31 MED ORDER — PROPOFOL 10 MG/ML IV BOLUS
INTRAVENOUS | Status: AC
  Filled 2022-07-31: qty 20

## 2022-07-31 MED ORDER — FENTANYL CITRATE (PF) 100 MCG/2ML IJ SOLN
INTRAMUSCULAR | Status: AC
  Filled 2022-07-31: qty 2

## 2022-07-31 MED ORDER — CEFAZOLIN SODIUM-DEXTROSE 2-4 GM/100ML-% IV SOLN
2.0000 g | INTRAVENOUS | Status: AC
  Administered 2022-07-31: 2 g via INTRAVENOUS
  Filled 2022-07-31: qty 100

## 2022-07-31 MED ORDER — DEXAMETHASONE SODIUM PHOSPHATE 10 MG/ML IJ SOLN
INTRAMUSCULAR | Status: AC
  Filled 2022-07-31: qty 1

## 2022-07-31 MED ORDER — ACETAMINOPHEN 500 MG PO TABS
1000.0000 mg | ORAL_TABLET | Freq: Once | ORAL | Status: DC
Start: 2022-07-31 — End: 2022-07-31

## 2022-07-31 MED ORDER — BUPIVACAINE LIPOSOME 1.3 % IJ SUSP
INTRAMUSCULAR | Status: AC
  Filled 2022-07-31: qty 20

## 2022-07-31 MED ORDER — MIDAZOLAM HCL 2 MG/2ML IJ SOLN
INTRAMUSCULAR | Status: AC
  Filled 2022-07-31: qty 2

## 2022-07-31 MED ORDER — BUPIVACAINE LIPOSOME 1.3 % IJ SUSP: 20.0000 mL | Freq: Once | INTRAMUSCULAR | Status: DC

## 2022-07-31 MED ORDER — ACETAMINOPHEN 500 MG PO TABS
1000.0000 mg | ORAL_TABLET | ORAL | Status: AC
  Administered 2022-07-31: 1000 mg via ORAL
  Filled 2022-07-31: qty 2

## 2022-07-31 MED ORDER — ONDANSETRON 4 MG PO TBDP
ORAL_TABLET | ORAL | Status: AC
  Filled 2022-07-31: qty 1

## 2022-07-31 MED ORDER — CHLORHEXIDINE GLUCONATE CLOTH 2 % EX PADS: 6.0000 | MEDICATED_PAD | Freq: Once | CUTANEOUS | Status: DC

## 2022-07-31 MED ORDER — LACTATED RINGERS IV SOLN: INTRAVENOUS | Status: DC

## 2022-07-31 MED ORDER — OXYCODONE HCL 5 MG PO TABS: 5.0000 mg | ORAL_TABLET | ORAL | 0 refills | Status: DC | PRN

## 2022-07-31 MED ORDER — HEPARIN SODIUM (PORCINE) 5000 UNIT/ML IJ SOLN
5000.0000 [IU] | Freq: Once | INTRAMUSCULAR | Status: AC
  Administered 2022-07-31: 5000 [IU] via SUBCUTANEOUS
  Filled 2022-07-31: qty 1

## 2022-07-31 MED ORDER — DEXMEDETOMIDINE HCL IN NACL 80 MCG/20ML IV SOLN
INTRAVENOUS | Status: DC | PRN
  Administered 2022-07-31: 8 ug via INTRAVENOUS
  Administered 2022-07-31: 4 ug via INTRAVENOUS

## 2022-07-31 MED ORDER — PROPOFOL 10 MG/ML IV BOLUS
INTRAVENOUS | Status: DC | PRN
  Administered 2022-07-31: 150 mg via INTRAVENOUS

## 2022-07-31 MED ORDER — BUPIVACAINE LIPOSOME 1.3 % IJ SUSP
INTRAMUSCULAR | Status: DC | PRN
  Administered 2022-07-31: 20 mL

## 2022-07-31 MED ORDER — LIDOCAINE HCL (PF) 2 % IJ SOLN
INTRAMUSCULAR | Status: AC
  Filled 2022-07-31: qty 5

## 2022-07-31 MED ORDER — OXYCODONE HCL 5 MG/5ML PO SOLN: 5.0000 mg | Freq: Once | ORAL | Status: AC | PRN

## 2022-07-31 MED ORDER — FENTANYL CITRATE (PF) 100 MCG/2ML IJ SOLN
INTRAMUSCULAR | Status: DC | PRN
  Administered 2022-07-31 (×2): 100 ug via INTRAVENOUS

## 2022-07-31 MED ORDER — OXYCODONE HCL 5 MG PO TABS
ORAL_TABLET | ORAL | Status: AC
  Filled 2022-07-31: qty 1

## 2022-07-31 MED ORDER — MIDAZOLAM HCL 2 MG/2ML IJ SOLN
INTRAMUSCULAR | Status: DC | PRN
  Administered 2022-07-31: 2 mg via INTRAVENOUS

## 2022-07-31 MED ORDER — KETOROLAC TROMETHAMINE 30 MG/ML IJ SOLN
30.0000 mg | Freq: Once | INTRAMUSCULAR | Status: AC | PRN
  Administered 2022-07-31: 30 mg via INTRAVENOUS

## 2022-07-31 MED ORDER — CHLORHEXIDINE GLUCONATE 0.12 % MT SOLN
15.0000 mL | Freq: Once | OROMUCOSAL | Status: AC
  Administered 2022-07-31: 15 mL via OROMUCOSAL

## 2022-07-31 MED ORDER — BUPIVACAINE-EPINEPHRINE 0.25% -1:200000 IJ SOLN
INTRAMUSCULAR | Status: DC | PRN
  Administered 2022-07-31: 30 mL

## 2022-07-31 MED ORDER — HYDROMORPHONE HCL 1 MG/ML IJ SOLN
INTRAMUSCULAR | Status: AC
  Filled 2022-07-31: qty 1

## 2022-07-31 MED ORDER — HYDROMORPHONE HCL 1 MG/ML IJ SOLN
0.2500 mg | INTRAMUSCULAR | Status: DC | PRN
  Administered 2022-07-31 (×2): 0.5 mg via INTRAVENOUS

## 2022-07-31 SURGICAL SUPPLY — 44 items
ADH SKN CLS APL DERMABOND .7 (GAUZE/BANDAGES/DRESSINGS) ×1
APL PRP STRL LF DISP 70% ISPRP (MISCELLANEOUS) ×1
APPLIER CLIP 5 13 M/L LIGAMAX5 (MISCELLANEOUS) ×1
APR CLP MED LRG 5 ANG JAW (MISCELLANEOUS) ×1
BAG SPEC RTRVL 10 TROC 200 (ENDOMECHANICALS) ×1
BLADE CLIPPER SURG (BLADE) IMPLANT
CANISTER SUCT 3000ML PPV (MISCELLANEOUS) ×1 IMPLANT
CHLORAPREP W/TINT 26 (MISCELLANEOUS) ×1 IMPLANT
CLIP APPLIE 5 13 M/L LIGAMAX5 (MISCELLANEOUS) ×1 IMPLANT
COVER MAYO STAND XLG (MISCELLANEOUS) ×1 IMPLANT
COVER SURGICAL LIGHT HANDLE (MISCELLANEOUS) ×1 IMPLANT
DERMABOND ADVANCED .7 DNX12 (GAUZE/BANDAGES/DRESSINGS) ×1 IMPLANT
DISSECTOR BLUNT TIP ENDO 5MM (MISCELLANEOUS) IMPLANT
DRAPE C-ARM 42X120 X-RAY (DRAPES) IMPLANT
ELECT PENCIL ROCKER SW 15FT (MISCELLANEOUS) ×1 IMPLANT
ENDOLOOP SUT PDS II  0 18 (SUTURE)
ENDOLOOP SUT PDS II 0 18 (SUTURE) IMPLANT
GLOVE BIO SURGEON STRL SZ 6.5 (GLOVE) ×1 IMPLANT
GLOVE BIOGEL PI IND STRL 6 (GLOVE) ×1 IMPLANT
GOWN STRL REUS W/ TWL LRG LVL3 (GOWN DISPOSABLE) ×3 IMPLANT
GOWN STRL REUS W/TWL LRG LVL3 (GOWN DISPOSABLE) ×3
IRRIG SUCT STRYKERFLOW 2 WTIP (MISCELLANEOUS) ×1
IRRIGATION SUCT STRKRFLW 2 WTP (MISCELLANEOUS) IMPLANT
KIT BASIN OR (CUSTOM PROCEDURE TRAY) ×1 IMPLANT
L-HOOK LAP DISP 36CM (ELECTROSURGICAL) ×1
LHOOK LAP DISP 36CM (ELECTROSURGICAL) ×1 IMPLANT
NDL INSUFFLATION 14GA 120MM (NEEDLE) IMPLANT
NEEDLE INSUFFLATION 14GA 120MM (NEEDLE) IMPLANT
NS IRRIG 1000ML POUR BTL (IV SOLUTION) ×1 IMPLANT
PAD ARMBOARD 7.5X6 YLW CONV (MISCELLANEOUS) ×1 IMPLANT
POUCH RETRIEVAL ECOSAC 10 (ENDOMECHANICALS) ×1 IMPLANT
SCISSORS LAP 5X35 DISP (ENDOMECHANICALS) ×1 IMPLANT
SET CHOLANGIOGRAPH MIX (MISCELLANEOUS) IMPLANT
SET TUBE SMOKE EVAC HIGH FLOW (TUBING) ×1 IMPLANT
SLEEVE ADV FIXATION 5X100MM (TROCAR) ×2 IMPLANT
SUT MNCRL AB 4-0 PS2 18 (SUTURE) ×1 IMPLANT
SUT VIC AB 0 UR5 27 (SUTURE) IMPLANT
SUT VICRYL 0 UR6 27IN ABS (SUTURE) IMPLANT
SYS BAG RETRIEVAL 10MM (BASKET)
SYSTEM BAG RETRIEVAL 10MM (BASKET) IMPLANT
TRAY LAPAROSCOPIC (CUSTOM PROCEDURE TRAY) ×1 IMPLANT
TROCAR BALLN 12MMX100 BLUNT (TROCAR) ×1 IMPLANT
TROCAR Z-THREAD FIOS 5X100MM (TROCAR) ×1 IMPLANT
WATER STERILE IRR 1000ML POUR (IV SOLUTION) ×1 IMPLANT

## 2022-07-31 NOTE — Anesthesia Procedure Notes (Signed)
Procedure Name: Intubation Date/Time: 07/31/2022 7:44 AM  Performed by: Nelle Don, CRNAPre-anesthesia Checklist: Patient identified, Emergency Drugs available, Suction available and Patient being monitored Patient Re-evaluated:Patient Re-evaluated prior to induction Oxygen Delivery Method: Circle system utilized Preoxygenation: Pre-oxygenation with 100% oxygen Induction Type: IV induction Ventilation: Mask ventilation without difficulty Laryngoscope Size: Mac and 4 Grade View: Grade I Tube type: Oral Tube size: 7.0 mm Number of attempts: 1 Airway Equipment and Method: Stylet Placement Confirmation: ETT inserted through vocal cords under direct vision, positive ETCO2 and breath sounds checked- equal and bilateral Secured at: 22 cm Tube secured with: Tape Dental Injury: Teeth and Oropharynx as per pre-operative assessment  Comments: Intubation done by Paramedic student G. Dr Richardson Landry present. Grade 1 view. No complications

## 2022-07-31 NOTE — Anesthesia Postprocedure Evaluation (Signed)
Anesthesia Post Note  Patient: Pennelope Forand [name]  Procedure(s) Performed: LAPAROSCOPIC CHOLECYSTECTOMY     Patient location during evaluation: PACU Anesthesia Type: General Level of consciousness: awake and alert Pain management: pain level controlled Vital Signs Assessment: post-procedure vital signs reviewed and stable Respiratory status: spontaneous breathing, nonlabored ventilation, respiratory function stable and patient connected to nasal cannula oxygen Cardiovascular status: blood pressure returned to baseline and stable Postop Assessment: no apparent nausea or vomiting Anesthetic complications: no  No notable events documented.  Last Vitals:  Vitals:   07/31/22 0930 07/31/22 0945  BP: 123/81 125/82  Pulse: 88 84  Resp: 20   Temp: 36.7 C   SpO2: 94% 96%    Last Pain:  Vitals:   07/31/22 0945  TempSrc:   PainSc: 5                  Trevor Iha

## 2022-07-31 NOTE — H&P (Signed)
    [facility] Thi Woodlief is an 37 y.o. female.   HPI: 73F with biliary dyskinesia. Plan for lap chole. The patient has had no hospitalizations, doctors visits, ER visits, surgeries, or newly diagnosed allergies since being seen in the office.    Past Medical History:  Diagnosis Date   Allergy    Dry eyes    Gallbladder polyp    GERD (gastroesophageal reflux disease)    H/O seasonal allergies     Past Surgical History:  Procedure Laterality Date   COLONOSCOPY  2021   ESOPHAGOGASTRODUODENOSCOPY ENDOSCOPY  2021    Family History  Problem Relation Age of Onset   Healthy Mother    Healthy Father    Colon cancer Neg Hx    Esophageal cancer Neg Hx    Prostate cancer Neg Hx    Rectal cancer Neg Hx     Social History:  reports that she has never smoked. She has never used smokeless tobacco. She reports that she does not drink alcohol and does not use drugs.  Allergies:  Allergies  Allergen Reactions   Gadolinium Derivatives Nausea And Vomiting    Pt vomited after multihance injection.     Medications: I have reviewed the patient's current medications.  Results for orders placed or performed during the hospital encounter of 07/31/22 (from the past 48 hour(s))  Pregnancy, urine POC     Status: None   Collection Time: 07/31/22  5:38 AM  Result Value Ref Range   Preg Test, Ur NEGATIVE NEGATIVE    Comment:        THE SENSITIVITY OF THIS METHODOLOGY IS >24 mIU/mL     No results found.  ROS 10 point review of systems is negative except as listed above in HPI.   Physical Exam Blood pressure 133/85, pulse 95, temperature 98.3 F (36.8 C), temperature source Oral, resp. rate 16, height 5\' 2"  (1.575 m), weight 75.3 kg, last menstrual period 07/08/2022, SpO2 98 %, not currently breastfeeding. Constitutional: well-developed, well-nourished HEENT: pupils equal, round, reactive to light, 2mm b/l, moist conjunctiva, external inspection of ears and nose normal, hearing  intact Oropharynx: normal oropharyngeal mucosa, normal dentition Neck: no thyromegaly, trachea midline, no midline cervical tenderness to palpation Chest: breath sounds equal bilaterally, normal respiratory effort, no midline or lateral chest wall tenderness to palpation/deformity Abdomen: soft, NT, no bruising, no hepatosplenomegaly GU: normal female genitalia  Back: no wounds, no thoracic/lumbar spine tenderness to palpation, no thoracic/lumbar spine stepoffs Rectal: deferred Extremities: 2+ radial and pedal pulses bilaterally, intact motor and sensation bilateral UE and LE, no peripheral edema MSK: unable to assess gait/station, no clubbing/cyanosis of fingers/toes, normal ROM of all four extremities Skin: warm, dry, no rashes Psych: normal memory, normal mood/affect     Assessment/Plan: 73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 Plan for lap chole. Informed consent was obtained after detailed explanation of risks, including bleeding, infection, biloma, hematoma, injury to common bile duct, need for IOC to delineate anatomy, and need for conversion to open procedure. All questions answered to the patient's satisfaction.  In-person interpreter services used to obtain consent: Elliot Gurney.   Diamantina Monks, MD General and Trauma Surgery [facility] Surgery

## 2022-07-31 NOTE — Transfer of Care (Signed)
Immediate Anesthesia Transfer of Care Note  Patient: Monica Steele Thi [name]  Procedure(s) Performed: LAPAROSCOPIC CHOLECYSTECTOMY  Patient Location: PACU  Anesthesia Type:General  Level of Consciousness: awake, alert , and oriented  Airway & Oxygen Therapy: Patient Spontanous Breathing and Patient connected to face mask oxygen  Post-op Assessment: Report given to RN, Post -op Vital signs reviewed and stable, and Patient moving all extremities X 4  Post vital signs: Reviewed and stable  Last Vitals:  Vitals Value Taken Time  BP 139/81 07/31/22 0838  Temp    Pulse 104 07/31/22 0839  Resp 26 07/31/22 0839  SpO2 100 % 07/31/22 0839  Vitals shown include unvalidated device data.  Last Pain:  Vitals:   07/31/22 0635  TempSrc:   PainSc: 0-No pain         Complications: No notable events documented.

## 2022-07-31 NOTE — Op Note (Signed)
   Operative Note  Date: 07/31/2022  Procedure: laparoscopic cholecystectomy  Pre-op diagnosis:  biliary dyskinesia Post-op diagnosis: same  Indication and clinical history: The patient is a 37 y.o. year old female with biliary dyskinesia  Surgeon: Diamantina Monks, MD  Anesthesiologist: Richardson Landry, MD Anesthesia: General  Findings:  Specimen: gallbladder EBL: 5cc Drains/Implants: none  Disposition: PACU - hemodynamically stable.  Description of procedure: The patient was positioned supine on the operating room table. Time-out was performed verifying correct patient, procedure, signature of informed consent, and administration of pre-operative antibiotics. General anesthetic induction and intubation were uneventful. The abdomen was prepped and draped in the usual sterile fashion. An infra-umbilical incision was made using an open technique using zero vicryl stay sutures on either side of the fascia and a 10mm Hassan port inserted. After establishing pneumoperitoneum, which the patient tolerated well, the abdominal cavity was inspected and no injury of any intra-abdominal structures was identified. Additional ports were placed under direct visualization and using local anesthetic: two 5mm ports in the right subcostal region and a 5mm port in the epigastric region. The patient was re-positioned to reverse Trendelenburg and right side up. Adhesiolysis was performed to expose the gallbladder, which was then retracted cephalad. The infundibulum was identified and retracted toward the right lower quadrant. The peritoneum was incised over the infundibulum and the triangle of Calot dissected to expose the critical view of safety. With clear identification and isolation of the cystic duct and cystic artery, the cystic artery was doubly clipped and divided. After this, the cystic duct was identified as a single structure entering the gallbladder, and was also doubly clipped and divided. The gallbladder was  dissected off the liver bed using electrocautery and hemostasis of the liver bed was confirmed prior to separation of the final peritoneal attachments of the gallbladder to the liver bed. The gallbladder fossa was irrigated and fluid returned clear. After transection of the final peritoneal attachments, the gallbladder was placed in an endoscopic specimen retrieval bag, removed via the umbilical port site, and sent to pathology as a permanent specimen. The gallbladder fossa was inspected confirming hemostasis, the absence of bile leakage from the cystic duct stump, and correct placement of clips on the cystic artery and cystic duct stumps. The abdomen was desufflated and the fascia of the umbilical port site was closed using the previously placed stay sutures. Additional local anesthetic was administered at the umbilical port site.  The skin of all incisions was closed with 4-0 monocryl. Sterile dressings were applied. All sponge and instrument counts were correct at the conclusion of the procedure. The patient was awakened from anesthesia, extubated uneventfully, and transported to the PACU - hemodynamically stable.. There were no complications.   Diamantina Monks, MD General and Trauma Surgery [facility] Surgery

## 2022-07-31 NOTE — Discharge Instructions (Addendum)
[facility]  LAPAROSCOPIC SURGERY: POST OP INSTRUCTIONS Always review your discharge instruction sheet given to you by the facility where your surgery was performed. IF YOU HAVE DISABILITY OR FAMILY LEAVE FORMS, YOU MUST BRING THEM TO THE OFFICE FOR PROCESSING.   DO NOT GIVE THEM TO YOUR DOCTOR.  PAIN CONTROL  Pain regimen: take over-the-counter tylenol (acetaminophen) 1000mg  every six hours, the prescription ibuprofen (600mg ) every six hours and the robaxin (methocarbamol) 750mg  every six hours. With all three of these, you should be taking something every two hours. Example: tylenol ( acetaminophen) at 8am, ibuprofen at 10am, robaxin (methocarbamol) at 12pm, tylenol (acetaminophen) again at 2pm, ibuprofen again at 4pm, robaxin (methocarbamol) at 6pm. You also have a prescription for oxycodone, which should be taken if the tylenol (acetaminophen), ibuprofen, and robaxin (methocarbamol) are not enough to control your pain. You may take the oxycodone as frequently as every four hours as needed, but if you are taking the other medications as above, you should not need the oxycodone this frequently. You have also been given a prescription for colace (docusate) which is a stool softener. Please take this as prescribed because the oxycodone can cause constipation and the colace (docusate) will minimize or prevent constipation. Do not drive while taking or under the influence of the oxycodone as it is a narcotic medication. Use ice packs to help control pain. If you need a refill on your pain medication, please contact your pharmacy.  They will contact our office to request authorization. Prescriptions will not be filled after 5pm or on week-ends.  HOME MEDICATIONS Take your usually prescribed medications unless otherwise directed.  DIET You should follow a light diet the first few days after arrival home.  Be sure to include lots of fluids daily.   CONSTIPATION It is common to  experience some constipation after surgery and if you are taking pain medication.  Increasing fluid intake and taking a stool softener (such as Colace) will usually help or prevent this problem from occurring.  A mild laxative (Milk of Magnesia or Miralax) should be taken according to package instructions if there are no bowel movements after 48 hours.  WOUND/INCISION CARE Most patients will experience some swelling and bruising in the area of the incisions.  Ice packs will help.  Swelling and bruising can take several days to resolve.  May shower beginning 08/01/2022.  Do not peel off or scrub skin glue. May allow warm soapy water to run over incision, then rinse and pat dry.  Do not soak in any water (tubs, hot tubs, pools, lakes, oceans) for one week.   ACTIVITIES You may resume regular (light) daily activities beginning the next day--such as daily self-care, walking, climbing stairs--gradually increasing activities as tolerated.  You may have sexual intercourse when it is comfortable.   No lifting greater than 5 pounds for six weeks.  You may drive when you are no longer taking narcotic pain medication, you can comfortably wear a seatbelt, and you can safely maneuver your car and apply brakes.  FOLLOW-UP You should see your doctor in the office for a follow-up appointment approximately 2-3 weeks after your surgery.  You should have been given your post-op/follow-up appointment when your surgery was scheduled.  If you did not receive a post-op/follow-up appointment, make sure that you call for this appointment within a day or two after you arrive home to insure a convenient appointment time.  WHEN TO CALL YOUR DOCTOR: Fever over 101.5 Inability to urinate  Continued bleeding from incision. Increased pain, redness, or drainage from the incision. Increasing abdominal pain  The clinic staff is available to answer your questions during regular business hours.  Please don't hesitate to call and  ask to speak to one of the nurses for clinical concerns.  If you have a medical emergency, go to the nearest emergency room or call 911.  A surgeon from [facility] is always on call at the hospital. [address], [place], [place]  [address] ? [address] [address], [place], [place]   [address] [phone] ? [phone] ? FAX [phone] Web site: [email or web address]      PH?U THU?T TRUNG TM [facility]  Ph?u thu?t n?i soi: POST H??NG D?N OP Lun xem l?i t? h??ng d?n xu?t vi?n do c? s? n?i b?n th?c hi?n ph?u thu?t cung c?p cho b?n. N?U B?N C KHUY?T T?T HO?C BI?U M?U L?I NGH? GIA ?NH, B?N PH?I MANG ??N V?N PHNG ?? X? L. ??NG ??A CHNG CHO BC S? C?A B?N.  KI?M SOT ?AU  1. Phc ?? gi?m ?au: u?ng tylenol (acetaminophen) khng k ??n 1000mg  m?i su gi?, ibuprofen theo toa (600mg ) m?i su gi? v robaxin (methocarbamol) 750mg  m?i su gi?Marland Kitchen V?i c? ba ?i?u ny, b?n nn u?ng th? g ? c? sau hai gi?. V d?: tylenol (acetaminophen) lc 8 gi? sng, ibuprofen lc 10 gi? sng, robaxin (methocarbamol) lc 12 gi? tr?a, tylenol (acetaminophen) n?a lc 2 gi? chi?u, l?i ibuprofen lc 4 gi? chi?u, robaxin (methocarbamol) lc 6 gi? chi?u. B?n c?ng c ??n thu?c oxycodone, nn dng n?u tylenol (acetaminophen), ibuprofen v robaxin (methocarbamol) khng ?? ?? ki?m sot c?n ?au c?a b?n. B?n c th? dng oxycodone th??ng xuyn c? sau b?n gi? n?u c?n, nh?ng n?u b?n ?ang dng cc lo?i thu?c khc nh? trn, b?n khng nn dng oxycodone th??ng xuyn nh? v?y. B?n c?ng ? ???c k ??n thu?c colace (docusate) l ch?t lm m?m phn. Vui lng dng thu?c ny theo ch? ??nh v oxycodone c th? gy to bn v colace (d Focusate) s? gi?m thi?u ho?c ng?n ng?a to bn. Khng li xe khi ?ang dng ho?c ch?u ?nh h??ng c?a oxycodone v ?y l thu?c gy nghi?n. 2. Ch??m ? ?? gip ki?m sot c?n ?au. 3. N?u b?n c?n mua thm thu?c gi?m ?au, vui lng lin h? v?i nh thu?c c?a b?n. H? s? lin h? v?i  v?n phng c?a chng ti ?? yu c?u ?y quy?n. ??n thu?c s? khng ???c c?p sau 5 gi? chi?u ho?c vo cu?i tu?n.  THU?C T?I NH 4. Dng cc lo?i thu?c th??ng ???c k ??n tr? khi c ch? d?n khc.  ?N KING 5. B?n nn ?n king nh? trong vi ngy ??u sau khi v? nh. Hy ch?c ch?n Monica Steele g?m nhi?u ch?t l?ng hng ngy.  TO BN 6. B?n th??ng b? to bn sau ph?u thu?t v sau khi dng thu?c gi?m ?au. T?ng l??ng ch?t l?ng v dng thu?c lm m?m phn (ch?ng h?n nh? Colace) th??ng s? gip ch ho?c ng?n ng?a v?n ?? ny x?y ra. Nn dng thu?c nhu?n trng nh? (S?a Magnesia ho?c Miralax) theo h??ng d?n trn [place] n?u khng ?i tiu sau 48 gi?.  CH?M [place] V?t th??ng/V?t m? 7. H?u h?t b?nh nhn s? b? s?ng v b?m tm ? vng v?t m?. Ti n??c ? s? gip ch. S?ng v b?m tm c th? m?t vi ngy ?? gi?i quy?t. 8. Thng 5 b?t ??u t?m Ngy 14  thng 5 n?m 2024. 9. Khng bc ho?c ch xt l?p keo trn da. C th? ?? n??c x phng ?m ch?y qua v?t m?, sau ? r?a s?ch v lau kh. 10. Khng ngm mnh trong b?t k? lo?i n??c no (b?n t?m, b?n n??c nng, h? b?i, h?, ??i d??ng) trong m?t tu?n.  CC HO?T ??NG 11. B?n c th? ti?p t?c cc ho?t ??ng th??ng ngy (nh? nhng) b?t ??u vo ngy hm sau--ch?ng h?n nh? t? ch?m [place]cock b?n thn hng ngy, ?i b?, leo c?u thang--t?ng d?n cc ho?t ??ng n?u c th? ch?u ??ng ???c. B?n c th? quan h? tnh d?c khi th?y tho?i mi. 12. Khng nng v?t n?ng qu 5 pound trong su tu?n. 13. B?n c th? li xe khi khng cn dng thu?c gi?m ?au gy m, b?n c th? tho?i mi th?t dy an ton v c th? ?i?u khi?n xe m?t cch an ton v s? d?ng phanh.  THEO ST 14. B?n nn ??n g?p bc s? t?i phng khm ?? ti khm kho?ng 2-3 tu?n sau khi ph?u thu?t. L? ra b?n ph?i ???c h?n khm h?u ph?u/theo di khi cu?c ph?u thu?t c?a b?n ???c ln l?ch. N?u b?n khng nh?n ???c cu?c h?n h?u ph?u/theo di, hy ??m b?o r?ng b?n g?i ??n cu?c h?n ny trong vng m?t ho?c hai ngy sau khi b?n v? ??n nh ?? ??m b?o th?i gian h?n thu?n  ti?n.  KHI NO C?N G?I BC S? C?A B?N: 1. S?t trn 101,5 2. Khng th? ?i ti?u 3. Ti?p t?c ch?y mu t? v?t m?. 4. ?au nhi?u h?n, t?y ?? ho?c ch?y d?ch t? v?t m?. 5. ?au b?ng ngy cng t?ng  Nhn vin phng khm s?n sng tr? l?i cc cu h?i c?a b?n trong gi? lm vi?c thng th??ng. Vui lng g?i ?i?n v yu c?u ???c ni chuy?n v?i m?t trong cc y t n?u c th?c m?c v? lm sng. N?u b?n g?p tr??ng h?p c?p c?u y t?, hy ??n phng c?p c?u g?n nh?t ho?c g?i 911. Bc s? ph?u thu?t c?a [facility] lun tc tr?c t?i b?nh vi?n.   [address], [address], [place], [place] [address]  [address] [address], [place], [place] [address] [phone]  FAX [phone] [place] web: [email or web address]

## 2022-08-01 ENCOUNTER — Encounter (HOSPITAL_COMMUNITY): Payer: Self-pay | Admitting: Surgery

## 2022-08-01 LAB — SURGICAL PATHOLOGY

## 2022-08-04 DIAGNOSIS — K5900 Constipation, unspecified: Secondary | ICD-10-CM | POA: Diagnosis not present

## 2022-08-04 DIAGNOSIS — K219 Gastro-esophageal reflux disease without esophagitis: Secondary | ICD-10-CM | POA: Diagnosis not present

## 2022-08-04 DIAGNOSIS — E669 Obesity, unspecified: Secondary | ICD-10-CM | POA: Diagnosis not present

## 2022-08-04 DIAGNOSIS — G8929 Other chronic pain: Secondary | ICD-10-CM | POA: Diagnosis not present

## 2022-08-04 DIAGNOSIS — Z6831 Body mass index (BMI) 31.0-31.9, adult: Secondary | ICD-10-CM | POA: Diagnosis not present

## 2022-08-04 DIAGNOSIS — Z8632 Personal history of gestational diabetes: Secondary | ICD-10-CM | POA: Diagnosis not present

## 2022-08-07 DIAGNOSIS — N644 Mastodynia: Secondary | ICD-10-CM | POA: Diagnosis not present

## 2022-08-07 DIAGNOSIS — Z8632 Personal history of gestational diabetes: Secondary | ICD-10-CM | POA: Diagnosis not present

## 2022-08-07 DIAGNOSIS — Z01419 Encounter for gynecological examination (general) (routine) without abnormal findings: Secondary | ICD-10-CM | POA: Diagnosis not present

## 2022-08-07 DIAGNOSIS — Z124 Encounter for screening for malignant neoplasm of cervix: Secondary | ICD-10-CM | POA: Diagnosis not present

## 2022-08-09 ENCOUNTER — Other Ambulatory Visit: Payer: Self-pay | Admitting: Obstetrics and Gynecology

## 2022-08-09 ENCOUNTER — Encounter: Payer: Self-pay | Admitting: Family Medicine

## 2022-08-09 ENCOUNTER — Ambulatory Visit (INDEPENDENT_AMBULATORY_CARE_PROVIDER_SITE_OTHER): Payer: 59 | Admitting: Family Medicine

## 2022-08-09 VITALS — BP 110/82 | HR 85 | Temp 97.8°F | Ht 62.0 in | Wt 166.0 lb

## 2022-08-09 DIAGNOSIS — M5431 Sciatica, right side: Secondary | ICD-10-CM | POA: Diagnosis not present

## 2022-08-09 DIAGNOSIS — N644 Mastodynia: Secondary | ICD-10-CM

## 2022-08-09 MED ORDER — PREDNISONE 20 MG PO TABS
40.0000 mg | ORAL_TABLET | Freq: Every day | ORAL | 0 refills | Status: DC
Start: 2022-08-09 — End: 2022-08-31

## 2022-08-09 NOTE — Patient Instructions (Signed)

## 2022-08-09 NOTE — Progress Notes (Signed)
Subjective:     Patient ID: Monica Steele [name], female    DOB: [birth year], 37 y.o.   MRN: [id number]  Chief Complaint  Patient presents with   Back Pain    When she goes to stand up and walking she has low back pain, has been going on for about a month. When sitting she is fine.    HPI Patient is in today for a 4-6 wk hx of low back pain on right. The pain is sharp and intermittent, relieved with standing still, sitting and laying down. Pain radiates down the back of her right leg.    Denies injury or hx of same.   Recent gallbladder surgery.   Denies fever, chills, dizziness, chest pain, palpitations, shortness of breath, abdominal pain, N/V/D, urinary symptoms, LE edema.  No numbness, tingling or weakness.   LMP: 08/02/2022    Health Maintenance Due  Topic Date Due   HPV VACCINES (2 - 3-dose SCDM series) 09/09/2020    Past Medical History:  Diagnosis Date   Allergy    Dry eyes    Gallbladder polyp    GERD (gastroesophageal reflux disease)    H/O seasonal allergies     Past Surgical History:  Procedure Laterality Date   CHOLECYSTECTOMY N/A 07/31/2022   Procedure: LAPAROSCOPIC CHOLECYSTECTOMY;  Surgeon: Diamantina Monks, MD;  Location: [facility];  Service: General;  Laterality: N/A;   COLONOSCOPY  2021   ESOPHAGOGASTRODUODENOSCOPY ENDOSCOPY  2021    Family History  Problem Relation Age of Onset   Healthy Mother    Healthy Father    Colon cancer Neg Hx    Esophageal cancer Neg Hx    Prostate cancer Neg Hx    Rectal cancer Neg Hx     Social History   Socioeconomic History   Marital status: Married    Spouse name: Not on file   Number of children: 2   Years of education: Not on file   Highest education level: 7th grade  Occupational History   Not on file  Tobacco Use   Smoking status: Never   Smokeless tobacco: Never  Vaping Use   Vaping Use: Never used  Substance and Sexual Activity   Alcohol use: No   Drug use: No   Sexual activity: Yes     Birth control/protection: None  Other Topics Concern   Not on file  Social History Narrative   Lives home with husband, Nigel Sloop, and two children.  1 son and 1 daughter. 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  Education 8th grade.  Caffeine one [employer] daily.   Social Determinants of Health   Financial Resource Strain: Low Risk  (08/06/2022)   Overall Financial Resource Strain (CARDIA)    Difficulty of Paying Living Expenses: Not hard at all  Food Insecurity: No Food Insecurity (08/06/2022)   Hunger Vital Sign    Worried About Running Out of Food in the Last Year: Never true    Ran Out of Food in the Last Year: Never true  Transportation Needs: No Transportation Needs (08/06/2022)   PRAPARE - Administrator, Civil Service (Medical): No    Lack of Transportation (Non-Medical): No  Physical Activity: Unknown (08/06/2022)   Exercise Vital Sign    Days of Exercise per Week: 0 days    Minutes of Exercise per Session: Not on file  Stress: No Stress Concern Present (08/06/2022)   [employer] of Occupational Health - Occupational Stress Questionnaire  Feeling of Stress : Not at all  Social Connections: Unknown (08/06/2022)   Social Connection and Isolation Panel [NHANES]    Frequency of Communication with Friends and Family: Once a week    Frequency of Social Gatherings with Friends and Family: Once a week    Attends Religious Services: Patient declined    Database administrator or Organizations: No    Attends Engineer, structural: Not on file    Marital Status: Married  Catering manager Violence: Not on file    Outpatient Medications Prior to Visit  Medication Sig Dispense Refill   acetaminophen (TYLENOL) 500 MG tablet Take 2 tablets (1,000 mg total) by mouth 4 (four) times daily. 120 tablet 3   docusate sodium (COLACE) 100 MG capsule Take 1 capsule (100 mg total) by mouth 2 (two) times daily. 60 capsule 2   esomeprazole (NEXIUM) 40 MG capsule Take 1 capsule  (40 mg total) by mouth 2 (two) times daily. 60 capsule 3   hyoscyamine (LEVSIN SL) 0.125 MG SL tablet Place 1 tablet (0.125 mg total) under the tongue every 4 (four) hours as needed. 30 tablet 2   ibuprofen (ADVIL) 600 MG tablet Take 1 tablet (600 mg total) by mouth 4 (four) times daily. 120 tablet 1   methocarbamol (ROBAXIN-750) 750 MG tablet Take 1 tablet (750 mg total) by mouth 4 (four) times daily. 120 tablet 1   oxyCODONE (ROXICODONE) 5 MG immediate release tablet Take 1 tablet (5 mg total) by mouth every 4 (four) hours as needed for severe pain. 20 tablet 0   No facility-administered medications prior to visit.    Allergies  Allergen Reactions   Gadolinium Derivatives Nausea And Vomiting    Pt vomited after multihance injection.     ROS     Objective:    Physical Exam Constitutional:      General: She is not in acute distress.    Appearance: She is not ill-appearing.  Eyes:     Extraocular Movements: Extraocular movements intact.     Conjunctiva/sclera: Conjunctivae normal.  Cardiovascular:     Rate and Rhythm: Normal rate.  Pulmonary:     Effort: Pulmonary effort is normal.  Abdominal:     General: There is no distension.     Palpations: Abdomen is soft.     Tenderness: There is no abdominal tenderness. There is no right CVA tenderness or left CVA tenderness.  Musculoskeletal:     Cervical back: Normal, normal range of motion and neck supple.     Thoracic back: Normal.     Lumbar back: Tenderness present. No spasms. Normal range of motion.     Comments: TTP over right SI joint.   Skin:    General: Skin is warm and dry.     Findings: No bruising or rash.  Neurological:     General: No focal deficit present.     Mental Status: She is alert and oriented to person, place, and time.     Sensory: No sensory deficit.     Motor: No weakness.     Coordination: Coordination normal.     Gait: Gait normal.  Psychiatric:        Mood and Affect: Mood normal.         Behavior: Behavior normal.        Thought Content: Thought content normal.     BP 110/82 (BP Location: Left Arm, Patient Position: Sitting, Cuff Size: Large)   Pulse 85   Temp 97.8  F (36.6 C) (Temporal)   Ht 5\' 2"  (1.575 m)   Wt 166 lb (75.3 kg)   LMP 07/08/2022 (Exact Date) Comment: negative urine POCT as of 07/31/22  SpO2 98%   BMI 30.36 kg/m  Wt Readings from Last 3 Encounters:  08/09/22 166 lb (75.3 kg)  07/31/22 166 lb (75.3 kg)  07/25/22 166 lb (75.3 kg)       Assessment & Plan:   Problem List Items Addressed This Visit   None Visit Diagnoses     Sciatica of right side    -  Primary   Relevant Medications   predniSONE (DELTASONE) 20 MG tablet      Medical interpreter present.  She has not been taking any pain medication but has several at home due to her recent gallbladder surgery.  Prednisone prescribed. May also take Tylenol and Robaxin if needed.  Use heat.  Stretches provided.  Follow up if worsening or not improving in the next 2 weeks.   I am having Maebelle Munroe start on predniSONE. I am also having her maintain her esomeprazole, hyoscyamine, acetaminophen, methocarbamol, ibuprofen, docusate sodium, and oxyCODONE.  Meds ordered this encounter  Medications   predniSONE (DELTASONE) 20 MG tablet    Sig: Take 2 tablets (40 mg total) by mouth daily with breakfast.    Dispense:  10 tablet    Refill:  0    Order Specific Question:   Supervising Provider    Answer:   Hillard Danker A [[id number]]

## 2022-08-14 NOTE — Progress Notes (Unsigned)
Office Visit    Patient Name: Monica Steele [name] Date of Encounter: 08/15/2022  PCP:  Avanell Shackleton, NP-C   [facility]  Cardiologist:  Meriam Sprague, MD  Advanced Practice Provider:  No care team member to display Electrophysiologist:  None   HPI    [facility] Monica Steele is a 37 y.o. female with a past medical history of GERD presents today for follow-up appointment.  Previously followed by Dr. Darryl Nestle and was initially seen 10/11/2020 for palpitations, atypical chest pain, and DOE when [redacted] weeks pregnant.  TTE at that time with normal BiV function and no significant valve abnormalities.  Cardiac monitor with no arrhythmias or significant ectopy.  Had spontaneous vaginal delivery at 39 weeks 3 days.  She was seen in follow-up by Chelsea Aus, PA-C for intermittent upper sternal chest tightness and SOB.  Pain was worse when standing, deep breathing and laying.  GI evaluated and recommended PPI for GERD.  D-dimer was negative.  ESR mildly elevated at 40.  Repeat limited echocardiogram to assess for effusion showed LVEF 60 to 65%, normal RV, no significant valvular disease, no effusion.  Patient was last seen 11/15/2021 and at that time had overall been feeling better.  Chest pain had improved but she did continue to have intermittent burning in her chest.  No exertional symptoms.  Occasional dizziness with position changes.  No orthopnea, PND, lower extremity edema, overall patient was feeling well and back to baseline.  Today, she states that every time she breathes she has a sharp pain under her left breast.  This was happening for about 2 months.  She had her gallbladder removed and the pain is still there.  She also has a history of GERD and she tells me her Nexium is not helping.  She is worried that on a CT scan 2 years ago it said that her heart was enlarged.  We reviewed her most recent echocardiogram which was about a year ago.  Blood pressure has been  well-controlled.  She also endorses some palpitations and we discussed adding a beta-blocker.  Reports no shortness of breath nor dyspnea on exertion. No edema, orthopnea, PND.   Past Medical History    Past Medical History:  Diagnosis Date   Allergy    Dry eyes    Gallbladder polyp    GERD (gastroesophageal reflux disease)    H/O seasonal allergies    Past Surgical History:  Procedure Laterality Date   CHOLECYSTECTOMY N/A 07/31/2022   Procedure: LAPAROSCOPIC CHOLECYSTECTOMY;  Surgeon: Diamantina Monks, MD;  Location: [facility];  Service: General;  Laterality: N/A;   COLONOSCOPY  2021   ESOPHAGOGASTRODUODENOSCOPY ENDOSCOPY  2021    Allergies  Allergies  Allergen Reactions   Gadolinium Derivatives Nausea And Vomiting    Pt vomited after multihance injection.    EKGs/Labs/Other Studies Reviewed:   The following studies were reviewed today: Cardiac Studies & Procedures       ECHOCARDIOGRAM  ECHOCARDIOGRAM COMPLETE 10/05/2021  Narrative ECHOCARDIOGRAM REPORT    Patient Name:   Monica Steele [facility] [place] Date of Exam: 10/05/2021 Medical Rec #:  [id number]           Height:       61.5 in Accession #:    [id number]          Weight:       156.0 lb Date of Birth:  [birth year]           BSA:  1.710 m Patient Age:    36 years            BP:           119/80 mmHg Patient Gender: F                   HR:           71 bpm. Exam Location:  Outpatient  Procedure: 2D Echo, Color Doppler and Cardiac Doppler  Indications:    Elevated sed rate  History:        Patient has prior history of Echocardiogram examinations, most recent 10/26/2020.  Sonographer:    Rodrigo Ran RCS Referring Phys: [id number] Manson Passey  IMPRESSIONS   1. Left ventricular ejection fraction, by estimation, is 60 to 65%. The left ventricle has normal function. The left ventricle has no regional wall motion abnormalities. Left ventricular diastolic parameters were normal. 2. Right ventricular systolic  function is normal. The right ventricular size is normal. 3. The mitral valve is normal in structure. No evidence of mitral valve regurgitation. No evidence of mitral stenosis. 4. The aortic valve is tricuspid. Aortic valve regurgitation is mild. Aortic valve sclerosis is present, with no evidence of aortic valve stenosis. 5. The inferior vena cava is normal in size with greater than 50% respiratory variability, suggesting right atrial pressure of 3 mmHg.  Comparison(s): A prior study was performed on 10/26/2020. No significant change from prior study.  FINDINGS Left Ventricle: Left ventricular ejection fraction, by estimation, is 60 to 65%. The left ventricle has normal function. The left ventricle has no regional wall motion abnormalities. The left ventricular internal cavity size was normal in size. There is no left ventricular hypertrophy. Left ventricular diastolic parameters were normal.  Right Ventricle: The right ventricular size is normal. No increase in right ventricular wall thickness. Right ventricular systolic function is normal.  Left Atrium: Left atrial size was normal in size.  Right Atrium: Right atrial size was normal in size.  Pericardium: There is no evidence of pericardial effusion.  Mitral Valve: The mitral valve is normal in structure. No evidence of mitral valve regurgitation. No evidence of mitral valve stenosis.  Tricuspid Valve: The tricuspid valve is normal in structure. Tricuspid valve regurgitation is trivial. No evidence of tricuspid stenosis.  Aortic Valve: The aortic valve is tricuspid. Aortic valve regurgitation is mild. Aortic valve sclerosis is present, with no evidence of aortic valve stenosis. Aortic valve mean gradient measures 3.0 mmHg. Aortic valve peak gradient measures 4.8 mmHg. Aortic valve area, by VTI measures 2.39 cm.  Pulmonic Valve: The pulmonic valve was normal in structure. Pulmonic valve regurgitation is not visualized. No evidence of  pulmonic stenosis.  Aorta: The aortic root is normal in size and structure.  Venous: The inferior vena cava is normal in size with greater than 50% respiratory variability, suggesting right atrial pressure of 3 mmHg.  IAS/Shunts: No atrial level shunt detected by color flow Doppler.   LEFT VENTRICLE PLAX 2D LVIDd:         4.20 cm   Diastology LVIDs:         3.00 cm   LV e' medial:    7.18 cm/s LV PW:         0.70 cm   LV E/e' medial:  9.3 LV IVS:        0.90 cm   LV e' lateral:   12.80 cm/s LVOT diam:     1.80 cm   LV E/e' lateral:  5.2 LV SV:         52 LV SV Index:   31 LVOT Area:     2.54 cm   RIGHT VENTRICLE RV Basal diam:  2.20 cm RV Mid diam:    1.90 cm TAPSE (M-mode): 1.5 cm  LEFT ATRIUM             Index        RIGHT ATRIUM          Index LA diam:        3.00 cm 1.75 cm/m   RA Area:     8.82 cm LA Vol (A2C):   21.3 ml 12.46 ml/m  RA Volume:   16.30 ml 9.53 ml/m LA Vol (A4C):   13.4 ml 7.84 ml/m LA Biplane Vol: 17.1 ml 10.00 ml/m AORTIC VALVE                    PULMONIC VALVE AV Area (Vmax):    2.48 cm     PV Vmax:          0.88 m/s AV Area (Vmean):   2.47 cm     PV Peak grad:     3.1 mmHg AV Area (VTI):     2.39 cm     PR End Diast Vel: 4.24 msec AV Vmax:           110.00 cm/s AV Vmean:          74.700 cm/s AV VTI:            0.219 m AV Peak Grad:      4.8 mmHg AV Mean Grad:      3.0 mmHg LVOT Vmax:         107.00 cm/s LVOT Vmean:        72.400 cm/s LVOT VTI:          0.206 m LVOT/AV VTI ratio: 0.94  AORTA Ao Root diam: 2.90 cm Ao Asc diam:  2.50 cm  MITRAL VALVE MV Area (PHT): 4.63 cm    SHUNTS MV Decel Time: 164 msec    Systemic VTI:  0.21 m MV E velocity: 66.80 cm/s  Systemic Diam: 1.80 cm MV A velocity: 58.30 cm/s MV E/A ratio:  1.15  Kardie Tobb DO Electronically signed by Thomasene Ripple DO Signature Date/Time: 10/05/2021/4:47:50 PM    Final    MONITORS  LONG TERM MONITOR (3-14 DAYS) 11/22/2020  Narrative  No significant  abnormalities  9 days of data recorded on monitor. Patient had a min HR of 54 bpm, max HR of 118 bpm, and avg HR of 76 bpm. Predominant underlying rhythm was Sinus Rhythm. No VT, SVT, atrial fibrillation, high degree block, or pauses noted. Isolated atrial and ventricular ectopy was rare (<1%). There were 3 triggered events, which corresponded to sinus rhythm.  No significant arrhythmias detected.            EKG:  EKG is not ordered today.    Recent Labs: 11/15/2021: BUN 14; Creatinine, Ser 0.66; Magnesium 2.1; Potassium 3.8; Sodium 137 07/25/2022: Hemoglobin 14.5; Platelets 349  Recent Lipid Panel    Component Value Date/Time   CHOL 157 12/23/2019 1208   TRIG 96 12/23/2019 1208   HDL 41 12/23/2019 1208   CHOLHDL 3.8 12/23/2019 1208   LDLCALC 98 12/23/2019 1208    Home Medications   Current Meds  Medication Sig   acetaminophen (TYLENOL) 500 MG tablet Take 2 tablets (1,000 mg total) by mouth 4 (four) times  daily.   docusate sodium (COLACE) 100 MG capsule Take 1 capsule (100 mg total) by mouth 2 (two) times daily.   hyoscyamine (LEVSIN SL) 0.125 MG SL tablet Place 1 tablet (0.125 mg total) under the tongue every 4 (four) hours as needed.   ibuprofen (ADVIL) 600 MG tablet Take 1 tablet (600 mg total) by mouth 4 (four) times daily.   methocarbamol (ROBAXIN-750) 750 MG tablet Take 1 tablet (750 mg total) by mouth 4 (four) times daily.   metoprolol tartrate (LOPRESSOR) 25 MG tablet Take 0.5 tablets (12.5 mg total) by mouth 2 (two) times daily.   oxyCODONE (ROXICODONE) 5 MG immediate release tablet Take 1 tablet (5 mg total) by mouth every 4 (four) hours as needed for severe pain.   predniSONE (DELTASONE) 20 MG tablet Take 2 tablets (40 mg total) by mouth daily with breakfast.   [DISCONTINUED] esomeprazole (NEXIUM) 40 MG capsule Take 1 capsule (40 mg total) by mouth 2 (two) times daily.   [DISCONTINUED] omeprazole (PRILOSEC) 40 MG capsule Take 1 capsule (40 mg total) by mouth 2 (two) times  daily before a meal.     Review of Systems      All other systems reviewed and are otherwise negative except as noted above.  Physical Exam    VS:  BP 114/76   Pulse 88   Ht 5\' 2"  (1.575 m)   Wt 164 lb 12.8 oz (74.8 kg)   LMP 07/08/2022 (Exact Date) Comment: negative urine POCT as of 07/31/22  SpO2 97%   BMI 30.14 kg/m  , BMI Body mass index is 30.14 kg/m.  Wt Readings from Last 3 Encounters:  08/15/22 164 lb 12.8 oz (74.8 kg)  08/09/22 166 lb (75.3 kg)  07/31/22 166 lb (75.3 kg)     GEN: Well nourished, well developed, in no acute distress. HEENT: normal. Neck: Supple, no JVD, carotid bruits, or masses. Cardiac: RRR, no murmurs, rubs, or gallops. No clubbing, cyanosis, edema.  Radials/PT 2+ and equal bilaterally.  Respiratory:  Respirations regular and unlabored, clear to auscultation bilaterally. GI: Soft, nontender, nondistended. MS: No deformity or atrophy. Skin: Warm and dry, no rash. Neuro:  Strength and sensation are intact. Psych: Normal affect.  Assessment & Plan    Chest pain -update echo -unlikely to be cardiac -sounds more consistent with chest wall nerve pain -Continue current medications, added metoprolol today  Palpitations -here and there, not everyday -2-3 times a week  -discussed low dose metoprolol, will add metoprolol 12.5mg  BID  -omeron BP cuff to keep track of BP  Shoulder pain -robaxin as needed  -She is also on Tylenol and ibuprofen as needed   4. GERD -change Nexium to Prilosec, future refills to PCP or GI   Disposition: Follow up 3 months with Meriam Sprague, MD or APP.  Signed, Sharlene Dory, PA-C 08/15/2022, 12:31 PM [facility]

## 2022-08-15 ENCOUNTER — Encounter: Payer: Self-pay | Admitting: Physician Assistant

## 2022-08-15 ENCOUNTER — Ambulatory Visit: Payer: 59 | Attending: Cardiology | Admitting: Physician Assistant

## 2022-08-15 VITALS — BP 114/76 | HR 88 | Ht 62.0 in | Wt 164.8 lb

## 2022-08-15 DIAGNOSIS — R002 Palpitations: Secondary | ICD-10-CM | POA: Diagnosis not present

## 2022-08-15 DIAGNOSIS — R079 Chest pain, unspecified: Secondary | ICD-10-CM | POA: Diagnosis not present

## 2022-08-15 DIAGNOSIS — M25519 Pain in unspecified shoulder: Secondary | ICD-10-CM

## 2022-08-15 MED ORDER — METOPROLOL TARTRATE 25 MG PO TABS: 12.5000 mg | ORAL_TABLET | Freq: Two times a day (BID) | ORAL | 3 refills | Status: DC

## 2022-08-15 MED ORDER — OMEPRAZOLE 40 MG PO CPDR: 40.0000 mg | DELAYED_RELEASE_CAPSULE | Freq: Two times a day (BID) | ORAL | 2 refills | Status: DC

## 2022-08-15 NOTE — Patient Instructions (Addendum)
Medication Instructions:   START TAKING: PRILOSEC 40 MG TWICE A DAY AND METOPROLOL 12.5  MG TWICE A DAY   STOP TAKING AND REMOVE THIS MEDICATION FROM YOUR MEDICATION LIST: NEXIUM    *If you need a refill on your cardiac medications before your next appointment, please call your pharmacy*   Lab Work:NONE ORDERED  TODAY    If you have labs (blood work) drawn today and your tests are completely normal, you will receive your results only by: MyChart Message (if you have MyChart) OR A paper copy in the mail If you have any lab test that is abnormal or we need to change your treatment, we will call you to review the results.   Testing/Procedures: Your physician has requested that you have an echocardiogram. Echocardiography is a painless test that uses sound waves to create images of your heart. It provides your doctor with information about the size and shape of your heart and how well your heart's chambers and valves are working. This procedure takes approximately one hour. There are no restrictions for this procedure. Please do NOT wear cologne, perfume, aftershave, or lotions (deodorant is allowed). Please arrive 15 minutes prior to your appointment time.     Follow-Up: At [facility], you and your health needs are our priority.  As part of our continuing mission to provide you with exceptional heart care, we have created designated Provider Care Teams.  These Care Teams include your primary Cardiologist (physician) and Advanced Practice Providers (APPs -  Physician Assistants and Nurse Practitioners) who all work together to provide you with the care you need, when you need it.  We recommend signing up for the patient portal called "MyChart".  Sign up information is provided on this After Visit Summary.  MyChart is used to connect with patients for Virtual Visits (Telemedicine).  Patients are able to view lab/test results, encounter notes, upcoming appointments, etc.   Non-urgent messages can be sent to your provider as well.   To learn more about what you can do with MyChart, go to [email or web address].    Your next appointment:   3 -4 month(s)  Provider:  TRANSFER TO DR Ronette Deter  NEW PATIENT    Other Instructions  PLEASE PURCHASE YOUR SELF AND OMERON BLOOD PRESSURE CUFF TO HELP MONITOR YOUR BLOOD PRESSURE.  Low-Sodium Eating Plan Salt (sodium) helps you keep a healthy balance of fluids in your body. Too much sodium can raise your blood pressure. It can also cause fluid and waste to be held in your body. Your health care provider or dietitian may recommend a low-sodium eating plan if you have high blood pressure (hypertension), kidney disease, liver disease, or heart failure. Eating less sodium can help lower your blood pressure and reduce swelling. It can also protect your heart, liver, and kidneys. What are tips for following this plan? Reading food labels  Check food labels for the amount of sodium per serving. If you eat more than one serving, you must multiply the listed amount by the number of servings. Choose foods with less than 140 milligrams (mg) of sodium per serving. Avoid foods with 300 mg of sodium or more per serving. Always check how much sodium is in a product, even if the label says "unsalted" or "no salt added." Shopping  Buy products labeled as "low-sodium" or "no salt added." Buy fresh foods. Avoid canned foods and pre-made or frozen meals. Avoid canned, cured, or processed meats. Buy breads that have less than 80 mg of  sodium per slice. Cooking  Eat more home-cooked food. Try to eat less restaurant, buffet, and fast food. Try not to add salt when you cook. Use salt-free seasonings or herbs instead of table salt or sea salt. Check with your provider or pharmacist before using salt substitutes. Cook with plant-based oils, such as canola, sunflower, or olive oil. Meal planning When eating at a restaurant, ask if your  food can be made with less salt or no salt. Avoid dishes labeled as brined, pickled, cured, or smoked. Avoid dishes made with soy sauce, miso, or teriyaki sauce. Avoid foods that have monosodium glutamate (MSG) in them. MSG may be added to some restaurant food, sauces, soups, bouillon, and canned foods. Make meals that can be grilled, baked, poached, roasted, or steamed. These are often made with less sodium. General information Try to limit your sodium intake to 1,500-2,300 mg each day, or the amount told by your provider. What foods should I eat? Fruits Fresh, frozen, or canned fruit. Fruit juice. Vegetables Fresh or frozen vegetables. "No salt added" canned vegetables. "No salt added" tomato sauce and paste. Low-sodium or reduced-sodium tomato and vegetable juice. Grains Low-sodium cereals, such as oats, puffed wheat and rice, and shredded wheat. Low-sodium crackers. Unsalted rice. Unsalted pasta. Low-sodium bread. Whole grain breads and whole grain pasta. Meats and other proteins Fresh or frozen meat, poultry, seafood, and fish. These should have no added salt. Low-sodium canned tuna and salmon. Unsalted nuts. Dried peas, beans, and lentils without added salt. Unsalted canned beans. Eggs. Unsalted nut butters. Dairy Milk. Soy milk. Cheese that is naturally low in sodium, such as ricotta cheese, fresh mozzarella, or Swiss cheese. Low-sodium or reduced-sodium cheese. Cream cheese. Yogurt. Seasonings and condiments Fresh and dried herbs and spices. Salt-free seasonings. Low-sodium mustard and ketchup. Sodium-free salad dressing. Sodium-free light mayonnaise. Fresh or refrigerated horseradish. Lemon juice. Vinegar. Other foods Homemade, reduced-sodium, or low-sodium soups. Unsalted popcorn and pretzels. Low-salt or salt-free chips. The items listed above may not be all the foods and drinks you can have. Talk to a dietitian to learn more. What foods should I avoid? Vegetables Sauerkraut,  pickled vegetables, and relishes. Olives. [place] fries. Onion rings. Regular canned vegetables, except low-sodium or reduced-sodium items. Regular canned tomato sauce and paste. Regular tomato and vegetable juice. Frozen vegetables in sauces. Grains Instant hot cereals. Bread stuffing, pancake, and biscuit mixes. Croutons. Seasoned rice or pasta mixes. Noodle soup cups. Boxed or frozen macaroni and cheese. Regular salted crackers. Self-rising flour. Meats and other proteins Meat or fish that is salted, canned, smoked, spiced, or pickled. Precooked or cured meat, such as sausages or meat loaves. Tomasa Blase. Ham. Pepperoni. Hot dogs. Corned beef. Chipped beef. Salt pork. Jerky. Pickled herring, anchovies, and sardines. Regular canned tuna. Salted nuts. Dairy Processed cheese and cheese spreads. Hard cheeses. Cheese curds. Blue cheese. Feta cheese. String cheese. Regular cottage cheese. Buttermilk. Canned milk. Fats and oils Salted butter. Regular margarine. Ghee. Bacon fat. Seasonings and condiments Onion salt, garlic salt, seasoned salt, table salt, and sea salt. Canned and packaged gravies. Worcestershire sauce. Tartar sauce. Barbecue sauce. Teriyaki sauce. Soy sauce, including reduced-sodium soy sauce. Steak sauce. Fish sauce. Oyster sauce. Cocktail sauce. Horseradish that you find on the shelf. Regular ketchup and mustard. Meat flavorings and tenderizers. Bouillon cubes. Hot sauce. Pre-made or packaged marinades. Pre-made or packaged taco seasonings. Relishes. Regular salad dressings. Salsa. Other foods Salted popcorn and pretzels. Corn chips and puffs. Potato and tortilla chips. Canned or dried soups. Pizza. Frozen entrees  and pot pies. The items listed above may not be all the foods and drinks you should avoid. Talk to a dietitian to learn more. This information is not intended to replace advice given to you by your health care provider. Make sure you discuss any questions you have with your health care  provider. Document Revised: 03/23/2022 Document Reviewed: 03/23/2022 Elsevier Patient Education  2024 Elsevier Inc.  Heart-Healthy Eating Plan Eating a healthy diet is important for the health of your heart. A heart-healthy eating plan includes: Eating less unhealthy fats. Eating more healthy fats. Eating less salt in your food. Salt is also called sodium. Making other changes in your diet. Talk with your doctor or a diet specialist (dietitian) to create an eating plan that is right for you. What is my plan? Your doctor may recommend an eating plan that includes: Total fat: ______% or less of total calories a day. Saturated fat: ______% or less of total calories a day. Cholesterol: less than _________mg a day. Sodium: less than _________mg a day. What are tips for following this plan? Cooking Avoid frying your food. Try to bake, boil, grill, or broil it instead. You can also reduce fat by: Removing the skin from poultry. Removing all visible fats from meats. Steaming vegetables in water or broth. Meal planning  At meals, divide your plate into four equal parts: Fill one-half of your plate with vegetables and green salads. Fill one-fourth of your plate with whole grains. Fill one-fourth of your plate with lean protein foods. Eat 2-4 cups of vegetables per day. One cup of vegetables is: 1 cup (91 g) broccoli or cauliflower florets. 2 medium carrots. 1 large bell pepper. 1 large sweet potato. 1 large tomato. 1 medium white potato. 2 cups (150 g) raw leafy greens. Eat 1-2 cups of fruit per day. One cup of fruit is: 1 small apple 1 large banana 1 cup (237 g) mixed fruit, 1 large orange,  cup (82 g) dried fruit, 1 cup (240 mL) 100% fruit juice. Eat more foods that have soluble fiber. These are apples, broccoli, carrots, beans, peas, and barley. Try to get 20-30 g of fiber per day. Eat 4-5 servings of nuts, legumes, and seeds per week: 1 serving of dried beans or legumes  equals  cup (90 g) cooked. 1 serving of nuts is  oz (12 almonds, 24 pistachios, or 7 walnut halves). 1 serving of seeds equals  oz (8 g). General information Eat more home-cooked food. Eat less restaurant, buffet, and fast food. Limit or avoid alcohol. Limit foods that are high in starch and sugar. Avoid fried foods. Lose weight if you are overweight. Keep track of how much salt (sodium) you eat. This is important if you have high blood pressure. Ask your doctor to tell you more about this. Try to add vegetarian meals each week. Fats Choose healthy fats. These include olive oil and canola oil, flaxseeds, walnuts, almonds, and seeds. Eat more omega-3 fats. These include salmon, mackerel, sardines, tuna, flaxseed oil, and ground flaxseeds. Try to eat fish at least 2 times each week. Check food labels. Avoid foods with trans fats or high amounts of saturated fat. Limit saturated fats. These are often found in animal products, such as meats, butter, and cream. These are also found in plant foods, such as palm oil, palm kernel oil, and coconut oil. Avoid foods with partially hydrogenated oils in them. These have trans fats. Examples are stick margarine, some tub margarines, cookies, crackers, and other baked  goods. What foods should I eat? Fruits All fresh, canned (in natural juice), or frozen fruits. Vegetables Fresh or frozen vegetables (raw, steamed, roasted, or grilled). Green salads. Grains Most grains. Choose whole wheat and whole grains most of the time. Rice and pasta, including brown rice and pastas made with whole wheat. Meats and other proteins Lean, well-trimmed beef, veal, pork, and lamb. Chicken and [place] without skin. All fish and shellfish. Wild duck, rabbit, pheasant, and venison. Egg whites or low-cholesterol egg substitutes. Dried beans, peas, lentils, and tofu. Seeds and most nuts. Dairy Low-fat or nonfat cheeses, including ricotta and mozzarella. Skim or 1% milk that  is liquid, powdered, or evaporated. Buttermilk that is made with low-fat milk. Nonfat or low-fat yogurt. Fats and oils Non-hydrogenated (trans-free) margarines. Vegetable oils, including soybean, sesame, sunflower, olive, peanut, safflower, corn, canola, and cottonseed. Salad dressings or mayonnaise made with a vegetable oil. Beverages Mineral water. Coffee and tea. Diet carbonated beverages. Sweets and desserts Sherbet, gelatin, and fruit ice. Small amounts of dark chocolate. Limit all sweets and desserts. Seasonings and condiments All seasonings and condiments. The items listed above may not be a complete list of foods and drinks you can eat. Contact a dietitian for more options. What foods should I avoid? Fruits Canned fruit in heavy syrup. Fruit in cream or butter sauce. Fried fruit. Limit coconut. Vegetables Vegetables cooked in cheese, cream, or butter sauce. Fried vegetables. Grains Breads that are made with saturated or trans fats, oils, or whole milk. Croissants. Sweet rolls. Donuts. High-fat crackers, such as cheese crackers. Meats and other proteins Fatty meats, such as hot dogs, ribs, sausage, bacon, rib-eye roast or steak. High-fat deli meats, such as salami and bologna. Caviar. Domestic duck and goose. Organ meats, such as liver. Dairy Cream, sour cream, cream cheese, and creamed cottage cheese. Whole-milk cheeses. Whole or 2% milk that is liquid, evaporated, or condensed. Whole buttermilk. Cream sauce or high-fat cheese sauce. Yogurt that is made from whole milk. Fats and oils Meat fat, or shortening. Cocoa butter, hydrogenated oils, palm oil, coconut oil, palm kernel oil. Solid fats and shortenings, including bacon fat, salt pork, lard, and butter. Nondairy cream substitutes. Salad dressings with cheese or sour cream. Beverages Regular sodas and juice drinks with added sugar. Sweets and desserts Frosting. Pudding. Cookies. Cakes. Pies. Milk chocolate or white chocolate.  Buttered syrups. Full-fat ice cream or ice cream drinks. The items listed above may not be a complete list of foods and drinks to avoid. Contact a dietitian for more information. Summary Heart-healthy meal planning includes eating less unhealthy fats, eating more healthy fats, and making other changes in your diet. Eat a balanced diet. This includes fruits and vegetables, low-fat or nonfat dairy, lean protein, nuts and legumes, whole grains, and heart-healthy oils and fats. This information is not intended to replace advice given to you by your health care provider. Make sure you discuss any questions you have with your health care provider. Document Revised: 04/11/2021 Document Reviewed: 04/11/2021 Elsevier Patient Education  2024 [employer].

## 2022-08-30 ENCOUNTER — Ambulatory Visit: Payer: 59 | Admitting: Family Medicine

## 2022-08-31 ENCOUNTER — Encounter: Payer: Self-pay | Admitting: Family Medicine

## 2022-08-31 ENCOUNTER — Ambulatory Visit (INDEPENDENT_AMBULATORY_CARE_PROVIDER_SITE_OTHER): Payer: 59

## 2022-08-31 ENCOUNTER — Ambulatory Visit (INDEPENDENT_AMBULATORY_CARE_PROVIDER_SITE_OTHER): Payer: 59 | Admitting: Family Medicine

## 2022-08-31 VITALS — BP 112/82 | HR 95 | Temp 97.8°F | Ht 62.0 in | Wt 166.0 lb

## 2022-08-31 DIAGNOSIS — R300 Dysuria: Secondary | ICD-10-CM | POA: Diagnosis not present

## 2022-08-31 DIAGNOSIS — M5431 Sciatica, right side: Secondary | ICD-10-CM

## 2022-08-31 DIAGNOSIS — M545 Low back pain, unspecified: Secondary | ICD-10-CM | POA: Diagnosis not present

## 2022-08-31 LAB — POC URINALSYSI DIPSTICK (AUTOMATED)
Bilirubin, UA: NEGATIVE
Blood, UA: POSITIVE
Glucose, UA: NEGATIVE
Ketones, UA: NEGATIVE
Leukocytes, UA: NEGATIVE
Nitrite, UA: NEGATIVE
Protein, UA: POSITIVE — AB
Spec Grav, UA: 1.015 (ref 1.010–1.025)
Urobilinogen, UA: 0.2 E.U./dL
pH, UA: 6 (ref 5.0–8.0)

## 2022-08-31 MED ORDER — MELOXICAM 15 MG PO TABS
15.0000 mg | ORAL_TABLET | Freq: Every day | ORAL | 0 refills | Status: DC
Start: 2022-08-31 — End: 2022-11-13

## 2022-08-31 NOTE — Patient Instructions (Signed)
Go downstairs for an X ray of your low back.   Start prescribed pain medication, meloxicam with food once daily.   Do not take any other over the counter pain medications.   Continue using heat and topical pain medication and doing the stretches.   You should hear from [employer] to schedule a visit.

## 2022-08-31 NOTE — Progress Notes (Signed)
Subjective:     Patient ID: Monica Steele, female    DOB: 10/16/1985, 37 y.o.   MRN: [id number]  Chief Complaint  Patient presents with   Follow-up    Sciatica pain still not better so is following up as instructed     HPI  Discussed the use of AI scribe software for clinical note transcription with the patient, who gave verbal consent to proceed.  History of Present Illness          Here for f/u on sciatica. States she completed course of steroids and has been treating her pain with topical medicine and stretches and the pain is worse.  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 Having difficulty standing up after sitting.   New c/o dysuria and pelvic pressure x 3 days.   LMP: currently on cycle.     Health Maintenance Due  Topic Date Due   HPV VACCINES (2 - 3-dose SCDM series) 09/09/2020   COVID-19 Vaccine (3 - 2023-24 season) 11/18/2021    Past Medical History:  Diagnosis Date   Allergy    Dry eyes    Gallbladder polyp    GERD (gastroesophageal reflux disease)    H/O seasonal allergies     Past Surgical History:  Procedure Laterality Date   CHOLECYSTECTOMY N/A 07/31/2022   Procedure: LAPAROSCOPIC CHOLECYSTECTOMY;  Surgeon: Diamantina Monks, MD;  Location: [facility];  Service: General;  Laterality: N/A;   COLONOSCOPY  2021   ESOPHAGOGASTRODUODENOSCOPY ENDOSCOPY  2021    Family History  Problem Relation Age of Onset   Healthy Mother    Healthy Father    Colon cancer Neg Hx    Esophageal cancer Neg Hx    Prostate cancer Neg Hx    Rectal cancer Neg Hx     Social History   Socioeconomic History   Marital status: Married    Spouse name: Not on file   Number of children: 2   Years of education: Not on file   Highest education level: 7th grade  Occupational History   Not on file  Tobacco Use   Smoking status: Never   Smokeless tobacco: Never  Vaping Use   Vaping Use: Never used  Substance and Sexual Activity   Alcohol use: No   Drug  use: No   Sexual activity: Yes    Birth control/protection: None  Other Topics Concern   Not on file  Social History Narrative   Lives home with husband, Nigel Sloop, and two children.  1 son and 1 daughter. 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  Education 8th grade.  Caffeine one [employer] daily.   Social Determinants of Health   Financial Resource Strain: Low Risk  (08/06/2022)   Overall Financial Resource Strain (CARDIA)    Difficulty of Paying Living Expenses: Not hard at all  Food Insecurity: No Food Insecurity (08/06/2022)   Hunger Vital Sign    Worried About Running Out of Food in the Last Year: Never true    Ran Out of Food in the Last Year: Never true  Transportation Needs: No Transportation Needs (08/06/2022)   PRAPARE - Administrator, Civil Service (Medical): No    Lack of Transportation (Non-Medical): No  Physical Activity: Unknown (08/06/2022)   Exercise Vital Sign    Days of Exercise per Week: 0 days    Minutes of Exercise per Session: Not on file  Stress: No Stress Concern Present (08/06/2022)  [employer] of Occupational Health - Occupational Stress Questionnaire    Feeling of Stress : Not at all  Social Connections: Unknown (08/06/2022)   Social Connection and Isolation Panel [NHANES]    Frequency of Communication with Friends and Family: Once a week    Frequency of Social Gatherings with Friends and Family: Once a week    Attends Religious Services: Patient declined    Database administrator or Organizations: No    Attends Engineer, structural: Not on file    Marital Status: Married  Catering manager Violence: Not on file    Outpatient Medications Prior to Visit  Medication Sig Dispense Refill   acetaminophen (TYLENOL) 500 MG tablet Take 2 tablets (1,000 mg total) by mouth 4 (four) times daily. 120 tablet 3   docusate sodium (COLACE) 100 MG capsule Take 1 capsule (100 mg total) by mouth 2 (two) times daily. 60 capsule 2   hyoscyamine  (LEVSIN SL) 0.125 MG SL tablet Place 1 tablet (0.125 mg total) under the tongue every 4 (four) hours as needed. 30 tablet 2   methocarbamol (ROBAXIN-750) 750 MG tablet Take 1 tablet (750 mg total) by mouth 4 (four) times daily. 120 tablet 1   metoprolol tartrate (LOPRESSOR) 25 MG tablet Take 0.5 tablets (12.5 mg total) by mouth 2 (two) times daily. 90 tablet 3   omeprazole (PRILOSEC) 40 MG capsule Take 1 capsule (40 mg total) by mouth 2 (two) times daily before a meal. 180 capsule 2   oxyCODONE (ROXICODONE) 5 MG immediate release tablet Take 1 tablet (5 mg total) by mouth every 4 (four) hours as needed for severe pain. 20 tablet 0   rizatriptan (MAXALT-MLT) 10 MG disintegrating tablet Take 10 mg by mouth as needed for migraine.     ibuprofen (ADVIL) 600 MG tablet Take 1 tablet (600 mg total) by mouth 4 (four) times daily. 120 tablet 1   predniSONE (DELTASONE) 20 MG tablet Take 2 tablets (40 mg total) by mouth daily with breakfast. 10 tablet 0   No facility-administered medications prior to visit.    Allergies  Allergen Reactions   Gadolinium Derivatives Nausea And Vomiting    Pt vomited after multihance injection.     Review of Systems  Constitutional:  Negative for chills, fever and malaise/fatigue.  Respiratory:  Negative for shortness of breath.   Cardiovascular:  Negative for chest pain and palpitations.  Gastrointestinal:  Negative for abdominal pain, constipation, diarrhea, nausea and vomiting.  Genitourinary:  Positive for dysuria. Negative for frequency and urgency.  Musculoskeletal:  Positive for back pain. Negative for falls and neck pain.  Neurological:  Negative for dizziness, sensory change and focal weakness.       Objective:    Physical Exam Constitutional:      General: She is not in acute distress.    Appearance: She is not ill-appearing.  HENT:     Mouth/Throat:     Mouth: Mucous membranes are moist.     Pharynx: Oropharynx is clear.  Eyes:     Extraocular  Movements: Extraocular movements intact.     Conjunctiva/sclera: Conjunctivae normal.  Cardiovascular:     Rate and Rhythm: Normal rate.  Pulmonary:     Effort: Pulmonary effort is normal.  Abdominal:     General: There is no distension.     Palpations: Abdomen is soft.     Tenderness: There is no abdominal tenderness. There is no right CVA tenderness, left CVA tenderness, guarding or rebound.  Musculoskeletal:  Cervical back: Normal, normal range of motion and neck supple.     Thoracic back: Normal.     Lumbar back: Tenderness and bony tenderness present. No spasms. Normal range of motion. Negative right straight leg raise test and negative left straight leg raise test.     Right lower leg: No edema.     Left lower leg: No edema.     Comments: Pain in right SI joint with extension. Good motion of lower back. TTP over right SI joint.  Normal hip motion.   Skin:    General: Skin is warm and dry.     Findings: No rash.  Neurological:     General: No focal deficit present.     Mental Status: She is alert and oriented to person, place, and time.     Sensory: No sensory deficit.     Motor: No weakness.     Coordination: Coordination normal.     Gait: Gait normal.  Psychiatric:        Mood and Affect: Mood normal.        Behavior: Behavior normal.        Thought Content: Thought content normal.      BP 112/82 (BP Location: Left Arm, Patient Position: Sitting, Cuff Size: Large)   Pulse 95   Temp 97.8 F (36.6 C) (Temporal)   Ht 5\' 2"  (1.575 m)   Wt 166 lb (75.3 kg)   SpO2 98%   Breastfeeding No   BMI 30.36 kg/m  Wt Readings from Last 3 Encounters:  08/31/22 166 lb (75.3 kg)  08/15/22 164 lb 12.8 oz (74.8 kg)  08/09/22 166 lb (75.3 kg)       Assessment & Plan:   Problem List Items Addressed This Visit   None Visit Diagnoses     Dysuria    -  Primary   Relevant Orders   POCT Urinalysis Dipstick (Automated) (Completed)   Urine Culture   Sciatica of right side        Relevant Medications   meloxicam (MOBIC) 15 MG tablet   Other Relevant Orders   DG Lumbar Spine Complete   Ambulatory referral to Sports Medicine      UA +blood (on menses), negative otherwise. Urine culture sent.  Start meloxicam. Continue conservative treatment.  Lumbar spine XR ordered.  Referral to [facility].   I have discontinued Contessa N. Koestner's ibuprofen and predniSONE. I am also having her start on meloxicam. Additionally, I am having her maintain her hyoscyamine, acetaminophen, methocarbamol, docusate sodium, oxyCODONE, rizatriptan, metoprolol tartrate, and omeprazole.  Meds ordered this encounter  Medications   meloxicam (MOBIC) 15 MG tablet    Sig: Take 1 tablet (15 mg total) by mouth daily.    Dispense:  30 tablet    Refill:  0    Order Specific Question:   Supervising Provider    Answer:   Hillard Danker A [[id number]]

## 2022-09-01 LAB — URINE CULTURE: Result:: NO GROWTH

## 2022-09-04 NOTE — Progress Notes (Unsigned)
    Aleen Sells [facility] [address] [place] [address] Phone: [phone]   Assessment and Plan:     There are no diagnoses linked to this encounter.  ***   Pertinent previous records reviewed include ***   Follow Up: ***     Subjective:   I, Caoimhe Damron, am serving as a Neurosurgeon for Doctor Richardean Sale  Chief Complaint: patient is a 37 year old female   HPI:   09/04/22 ***  Relevant Historical Information: ***  Additional pertinent review of systems negative.   Current Outpatient Medications:    acetaminophen (TYLENOL) 500 MG tablet, Take 2 tablets (1,000 mg total) by mouth 4 (four) times daily., Disp: 120 tablet, Rfl: 3   docusate sodium (COLACE) 100 MG capsule, Take 1 capsule (100 mg total) by mouth 2 (two) times daily., Disp: 60 capsule, Rfl: 2   hyoscyamine (LEVSIN SL) 0.125 MG SL tablet, Place 1 tablet (0.125 mg total) under the tongue every 4 (four) hours as needed., Disp: 30 tablet, Rfl: 2   meloxicam (MOBIC) 15 MG tablet, Take 1 tablet (15 mg total) by mouth daily., Disp: 30 tablet, Rfl: 0   methocarbamol (ROBAXIN-750) 750 MG tablet, Take 1 tablet (750 mg total) by mouth 4 (four) times daily., Disp: 120 tablet, Rfl: 1   metoprolol tartrate (LOPRESSOR) 25 MG tablet, Take 0.5 tablets (12.5 mg total) by mouth 2 (two) times daily., Disp: 90 tablet, Rfl: 3   omeprazole (PRILOSEC) 40 MG capsule, Take 1 capsule (40 mg total) by mouth 2 (two) times daily before a meal., Disp: 180 capsule, Rfl: 2   oxyCODONE (ROXICODONE) 5 MG immediate release tablet, Take 1 tablet (5 mg total) by mouth every 4 (four) hours as needed for severe pain., Disp: 20 tablet, Rfl: 0   rizatriptan (MAXALT-MLT) 10 MG disintegrating tablet, Take 10 mg by mouth as needed for migraine., Disp: , Rfl:    Objective:     There were no vitals filed for this visit.    There is no height or weight on file to calculate BMI.    Physical Exam:     ***   Electronically signed by:  Aleen Sells [facility] 10:25 AM 09/04/22

## 2022-09-05 ENCOUNTER — Ambulatory Visit: Payer: 59 | Admitting: Family Medicine

## 2022-09-05 ENCOUNTER — Ambulatory Visit (INDEPENDENT_AMBULATORY_CARE_PROVIDER_SITE_OTHER): Payer: 59 | Admitting: Sports Medicine

## 2022-09-05 ENCOUNTER — Ambulatory Visit (HOSPITAL_COMMUNITY): Payer: 59 | Attending: Physician Assistant

## 2022-09-05 VITALS — BP 124/82 | HR 82 | Ht 62.0 in | Wt 164.0 lb

## 2022-09-05 DIAGNOSIS — R002 Palpitations: Secondary | ICD-10-CM | POA: Diagnosis not present

## 2022-09-05 DIAGNOSIS — I517 Cardiomegaly: Secondary | ICD-10-CM | POA: Diagnosis not present

## 2022-09-05 DIAGNOSIS — G8929 Other chronic pain: Secondary | ICD-10-CM | POA: Diagnosis not present

## 2022-09-05 DIAGNOSIS — M5441 Lumbago with sciatica, right side: Secondary | ICD-10-CM | POA: Diagnosis not present

## 2022-09-05 DIAGNOSIS — R079 Chest pain, unspecified: Secondary | ICD-10-CM | POA: Insufficient documentation

## 2022-09-05 LAB — ECHOCARDIOGRAM COMPLETE
Area-P 1/2: 3.48 cm2
Height: 62 in
S' Lateral: 2.5 cm
Weight: 2624 oz

## 2022-09-05 MED ORDER — CYCLOBENZAPRINE HCL 5 MG PO TABS: 5.0000 mg | ORAL_TABLET | Freq: Every day | ORAL | 0 refills | Status: DC

## 2022-09-05 NOTE — Patient Instructions (Signed)
Low back HEP  - Start meloxicam 15 mg daily x2 weeks.  If still having pain after 2 weeks, complete 3rd-week of meloxicam. May use remaining meloxicam as needed once daily for pain control.  Do not to use additional NSAIDs while taking meloxicam.  May use Tylenol [phone] mg 2 to 3 times a day for breakthrough pain. Flexeril 5-10 mg nightly as needed for muscle spasm Pt referral  4 week follow up

## 2022-09-12 ENCOUNTER — Ambulatory Visit
Admission: RE | Admit: 2022-09-12 | Discharge: 2022-09-12 | Disposition: A | Discharge status: Home / Self Care | Payer: 59 | Source: Ambulatory Visit | Attending: Obstetrics and Gynecology | Admitting: Obstetrics and Gynecology

## 2022-09-12 DIAGNOSIS — N644 Mastodynia: Secondary | ICD-10-CM

## 2022-09-12 DIAGNOSIS — N6459 Other signs and symptoms in breast: Secondary | ICD-10-CM | POA: Diagnosis not present

## 2022-10-03 ENCOUNTER — Ambulatory Visit: Payer: 59 | Admitting: Sports Medicine

## 2022-10-03 ENCOUNTER — Ambulatory Visit: Payer: 59 | Admitting: Physical Therapy

## 2022-10-18 ENCOUNTER — Ambulatory Visit: Payer: 59 | Admitting: Cardiology

## 2022-11-13 ENCOUNTER — Other Ambulatory Visit: Payer: Self-pay | Admitting: Family Medicine

## 2022-11-13 DIAGNOSIS — M5431 Sciatica, right side: Secondary | ICD-10-CM

## 2022-11-27 ENCOUNTER — Telehealth: Payer: Self-pay | Admitting: Gastroenterology

## 2022-11-27 NOTE — Telephone Encounter (Signed)
Left message on machine to call back using interpreter service  

## 2022-11-27 NOTE — Telephone Encounter (Signed)
Patient called to speak with a nurse states she has been having a lot of abdominal pain and vomiting.

## 2022-11-28 NOTE — Telephone Encounter (Signed)
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  Please arrange for surgical consultation.  She should return to [facility] after seeing the surgeon.  Thank you.

## 2022-11-29 NOTE — Telephone Encounter (Signed)
Unable to reach the pt by phone will await further communication from the pt.

## 2022-12-04 ENCOUNTER — Encounter: Payer: Self-pay | Admitting: Nurse Practitioner

## 2022-12-05 NOTE — Progress Notes (Unsigned)
    Aleen [facility]ells [facility] [address] [place] [address] Phone: [phone]   Assessment and Plan:     There are no diagnoses linked to this encounter.  ***   Pertinent previous records reviewed include ***   Follow Up: ***     Subjective:   I, Arneisha Kincannon, am serving as a Neurosurgeon for Doctor Richardean Sale   Chief Complaint: right hip pain    HPI:    09/05/2022 Patient is a 37 year old female complaining of right hip pain. Patient states right hip pain for 3 months, no MOI, radiating down to the right butt cheek, to the leg and has pain when walking,no meds for the pain,    12/06/2022 Patient states    Relevant Historical Information: GERD  Additional pertinent review of systems negative.   Current Outpatient Medications:    acetaminophen (TYLENOL) 500 MG tablet, Take 2 tablets (1,000 mg total) by mouth 4 (four) times daily., Disp: 120 tablet, Rfl: 3   cyclobenzaprine (FLEXERIL) 5 MG tablet, Take 1 tablet (5 mg total) by mouth at bedtime., Disp: 30 tablet, Rfl: 0   docusate sodium (COLACE) 100 MG capsule, Take 1 capsule (100 mg total) by mouth 2 (two) times daily., Disp: 60 capsule, Rfl: 2   hyoscyamine (LEVSIN SL) 0.125 MG SL tablet, Place 1 tablet (0.125 mg total) under the tongue every 4 (four) hours as needed., Disp: 30 tablet, Rfl: 2   meloxicam (MOBIC) 15 MG tablet, TAKE 1 TABLET (15 MG TOTAL) BY MOUTH DAILY., Disp: 30 tablet, Rfl: 0   methocarbamol (ROBAXIN-750) 750 MG tablet, Take 1 tablet (750 mg total) by mouth 4 (four) times daily., Disp: 120 tablet, Rfl: 1   metoprolol tartrate (LOPRESSOR) 25 MG tablet, Take 0.5 tablets (12.5 mg total) by mouth 2 (two) times daily., Disp: 90 tablet, Rfl: 3   omeprazole (PRILOSEC) 40 MG capsule, Take 1 capsule (40 mg total) by mouth 2 (two) times daily before a meal., Disp: 180 capsule, Rfl: 2   oxyCODONE (ROXICODONE) 5 MG immediate release tablet, Take 1 tablet (5 mg total) by  mouth every 4 (four) hours as needed for severe pain., Disp: 20 tablet, Rfl: 0   rizatriptan (MAXALT-MLT) 10 MG disintegrating tablet, Take 10 mg by mouth as needed for migraine., Disp: , Rfl:    Objective:     There were no vitals filed for this visit.    There is no height or weight on file to calculate BMI.    Physical Exam:    ***   Electronically signed by:  Aleen Sells [facility] 7:11 AM 12/05/22

## 2022-12-06 ENCOUNTER — Ambulatory Visit: Payer: 59 | Attending: Internal Medicine | Admitting: Internal Medicine

## 2022-12-06 ENCOUNTER — Encounter: Payer: Self-pay | Admitting: Internal Medicine

## 2022-12-06 ENCOUNTER — Ambulatory Visit (INDEPENDENT_AMBULATORY_CARE_PROVIDER_SITE_OTHER): Payer: 59 | Admitting: Sports Medicine

## 2022-12-06 ENCOUNTER — Encounter: Payer: Self-pay | Admitting: Family Medicine

## 2022-12-06 ENCOUNTER — Ambulatory Visit (INDEPENDENT_AMBULATORY_CARE_PROVIDER_SITE_OTHER): Payer: 59 | Admitting: Family Medicine

## 2022-12-06 VITALS — BP 139/88 | HR 82 | Ht 61.0 in | Wt 166.4 lb

## 2022-12-06 VITALS — BP 118/80 | HR 99 | Ht 61.0 in | Wt 166.0 lb

## 2022-12-06 VITALS — BP 102/74 | HR 80 | Temp 97.6°F | Ht 62.0 in | Wt 166.0 lb

## 2022-12-06 DIAGNOSIS — R079 Chest pain, unspecified: Secondary | ICD-10-CM | POA: Diagnosis not present

## 2022-12-06 DIAGNOSIS — R112 Nausea with vomiting, unspecified: Secondary | ICD-10-CM

## 2022-12-06 DIAGNOSIS — R1033 Periumbilical pain: Secondary | ICD-10-CM

## 2022-12-06 DIAGNOSIS — R002 Palpitations: Secondary | ICD-10-CM | POA: Diagnosis not present

## 2022-12-06 DIAGNOSIS — R10812 Left upper quadrant abdominal tenderness: Secondary | ICD-10-CM | POA: Diagnosis not present

## 2022-12-06 DIAGNOSIS — G43909 Migraine, unspecified, not intractable, without status migrainosus: Secondary | ICD-10-CM | POA: Insufficient documentation

## 2022-12-06 DIAGNOSIS — G43809 Other migraine, not intractable, without status migrainosus: Secondary | ICD-10-CM

## 2022-12-06 DIAGNOSIS — K219 Gastro-esophageal reflux disease without esophagitis: Secondary | ICD-10-CM

## 2022-12-06 DIAGNOSIS — G8929 Other chronic pain: Secondary | ICD-10-CM

## 2022-12-06 DIAGNOSIS — M5441 Lumbago with sciatica, right side: Secondary | ICD-10-CM

## 2022-12-06 LAB — POC URINALSYSI DIPSTICK (AUTOMATED)
Bilirubin, UA: POSITIVE
Blood, UA: NEGATIVE
Glucose, UA: NEGATIVE
Ketones, UA: NEGATIVE
Leukocytes, UA: NEGATIVE
Nitrite, UA: NEGATIVE
Protein, UA: POSITIVE — AB
Spec Grav, UA: 1.02 (ref 1.010–1.025)
Urobilinogen, UA: 0.2 U/dL
pH, UA: 6 (ref 5.0–8.0)

## 2022-12-06 LAB — CBC WITH DIFFERENTIAL/PLATELET
Basophils Absolute: 0.1 10*3/uL (ref 0.0–0.1)
Basophils Relative: 0.6 % (ref 0.0–3.0)
Eosinophils Absolute: 0.1 10*3/uL (ref 0.0–0.7)
Eosinophils Relative: 1.6 % (ref 0.0–5.0)
HCT: 44.1 % (ref 36.0–46.0)
Hemoglobin: 14.2 g/dL (ref 12.0–15.0)
Lymphocytes Relative: 25.6 % (ref 12.0–46.0)
Lymphs Abs: 2.3 10*3/uL (ref 0.7–4.0)
MCHC: 32.3 g/dL (ref 30.0–36.0)
MCV: 87 fl (ref 78.0–100.0)
Monocytes Absolute: 0.6 10*3/uL (ref 0.1–1.0)
Monocytes Relative: 6.7 % (ref 3.0–12.0)
Neutro Abs: 5.9 10*3/uL (ref 1.4–7.7)
Neutrophils Relative %: 65.5 % (ref 43.0–77.0)
Platelets: 381 10*3/uL (ref 150.0–400.0)
RBC: 5.06 Mil/uL (ref 3.87–5.11)
RDW: 12.5 % (ref 11.5–15.5)
WBC: 8.9 10*3/uL (ref 4.0–10.5)

## 2022-12-06 LAB — COMPREHENSIVE METABOLIC PANEL WITH GFR
ALT: 25 U/L (ref 0–35)
AST: 18 U/L (ref 0–37)
Albumin: 4.6 g/dL (ref 3.5–5.2)
Alkaline Phosphatase: 95 U/L (ref 39–117)
BUN: 12 mg/dL (ref 6–23)
CO2: 28 meq/L (ref 19–32)
Calcium: 9.7 mg/dL (ref 8.4–10.5)
Chloride: 104 meq/L (ref 96–112)
Creatinine, Ser: 0.71 mg/dL (ref 0.40–1.20)
GFR: 108.56 mL/min (ref >60.00)
Glucose, Bld: 104 mg/dL — ABNORMAL HIGH (ref 70–99)
Potassium: 3.9 meq/L (ref 3.5–5.1)
Sodium: 138 meq/L (ref 135–145)
Total Bilirubin: 0.4 mg/dL (ref 0.2–1.2)
Total Protein: 8 g/dL (ref 6.0–8.3)

## 2022-12-06 LAB — POCT URINE PREGNANCY: Preg Test, Ur: NEGATIVE

## 2022-12-06 LAB — LIPASE: Lipase: 32 U/L (ref 11.0–59.0)

## 2022-12-06 MED ORDER — MELOXICAM 15 MG PO TABS: 15.0000 mg | ORAL_TABLET | Freq: Every day | ORAL | 0 refills | Status: DC

## 2022-12-06 NOTE — Progress Notes (Deleted)
Cardiology Office Note:  .    Date:  12/06/2022  ID:  Monica Steele, DOB [birth year], MRN [id number] PCP: Monica Shackleton, NP-C  [facility] Providers Cardiologist:  Monica Sprague, MD { Click to update primary MD,subspecialty MD or APP then REFRESH:1}    CC: ***   History of Present Illness: .    [facility] Monica Steele is a 37 y.o. female ***    Relevant histories: .  Social *** ROS: As per HPI.   Studies Reviewed: .   Cardiac Studies & Procedures       ECHOCARDIOGRAM  ECHOCARDIOGRAM COMPLETE 09/05/2022  Narrative ECHOCARDIOGRAM REPORT    Patient Name:   Monica Steele Date of Exam: 09/05/2022 Medical Rec #:  [id number]           Height:       62.0 in Accession #:    [id number]          Weight:       164.0 lb Date of Birth:  June 21, 1985           BSA:          1.757 m Patient Age:    37 years            BP:           124/82 mmHg Patient Gender: F                   HR:           77 bpm. Exam Location:  [address]  Procedure: 2D Echo, Color Doppler, Cardiac Doppler, 3D Echo and Strain Analysis  Indications:    Chest Pain R07.9  History:        Patient has prior history of Echocardiogram examinations, most recent 10/05/2021.  Sonographer:    Monica Steele RDCS Referring Phys: [id number] Monica Steele  IMPRESSIONS   1. Left ventricular ejection fraction, by estimation, is 65 to 70%. Left ventricular ejection fraction by 3D volume is 66 %. The left ventricle has normal function. The left ventricle has no regional wall motion abnormalities. There is mild left ventricular hypertrophy. Left ventricular diastolic parameters were normal. 2. Right ventricular systolic function is normal. The right ventricular size is normal. Tricuspid regurgitation signal is inadequate for assessing PA pressure. 3. The mitral valve is normal in structure. No evidence of mitral valve regurgitation. No evidence of mitral stenosis. 4. The aortic valve is tricuspid.  Aortic valve regurgitation is not visualized. No aortic stenosis is present. 5. The inferior vena cava is normal in size with greater than 50% respiratory variability, suggesting right atrial pressure of 3 mmHg.  FINDINGS Left Ventricle: Left ventricular ejection fraction, by estimation, is 65 to 70%. Left ventricular ejection fraction by 3D volume is 66 %. The left ventricle has normal function. The left ventricle has no regional wall motion abnormalities. The left ventricular internal cavity size was normal in size. There is mild left ventricular hypertrophy. Left ventricular diastolic parameters were normal.  Right Ventricle: The right ventricular size is normal. No increase in right ventricular wall thickness. Right ventricular systolic function is normal. Tricuspid regurgitation signal is inadequate for assessing PA pressure.  Left Atrium: Left atrial size was normal in size.  Right Atrium: Right atrial size was normal in size.  Pericardium: There is no evidence of pericardial effusion.  Mitral Valve: The mitral valve is normal in structure. No evidence of mitral valve regurgitation. No evidence of  mitral valve stenosis.  Tricuspid Valve: The tricuspid valve is normal in structure. Tricuspid valve regurgitation is trivial.  Aortic Valve: The aortic valve is tricuspid. Aortic valve regurgitation is not visualized. No aortic stenosis is present.  Pulmonic Valve: The pulmonic valve was not well visualized. Pulmonic valve regurgitation is trivial.  Aorta: The aortic root and ascending aorta are structurally normal, with no evidence of dilitation.  Venous: The inferior vena cava is normal in size with greater than 50% respiratory variability, suggesting right atrial pressure of 3 mmHg.  IAS/Shunts: The interatrial septum was not well visualized.   LEFT VENTRICLE PLAX 2D LVIDd:         4.00 cm         Diastology LVIDs:         2.50 cm         LV e' medial:    7.01 cm/s LV PW:          1.00 cm         LV E/e' medial:  8.5 LV IVS:        1.00 cm         LV e' lateral:   12.60 cm/s LVOT diam:     2.30 cm         LV E/e' lateral: 4.7 LV SV:         84 LV SV Index:   48 LVOT Area:     4.15 cm        3D Volume EF LV 3D EF:    Left ventricul ar ejection fraction by 3D volume is 66 %.  3D Volume EF: 3D EF:        66 % LV EDV:       79 ml LV ESV:       26 ml LV SV:        52 ml  RIGHT VENTRICLE RV Basal diam:  2.00 cm RV Mid diam:    2.30 cm RV S prime:     10.10 cm/s TAPSE (M-mode): 1.6 cm  LEFT ATRIUM             Index        RIGHT ATRIUM          Index LA diam:        3.10 cm 1.76 cm/m   RA Area:     8.99 cm LA Vol (A2C):   33.3 ml 18.95 ml/m  RA Volume:   15.20 ml 8.65 ml/m LA Vol (A4C):   35.5 ml 20.20 ml/m LA Biplane Vol: 37.4 ml 21.29 ml/m AORTIC VALVE LVOT Vmax:   106.00 cm/s LVOT Vmean:  71.300 cm/s LVOT VTI:    0.202 m  AORTA Ao Root diam: 3.40 cm Ao Asc diam:  3.00 cm  MITRAL VALVE MV Area (PHT): 3.48 cm    SHUNTS MV Decel Time: 218 msec    Systemic VTI:  0.20 m MV E velocity: 59.60 cm/s  Systemic Diam: 2.30 cm MV A velocity: 51.10 cm/s MV E/A ratio:  1.17  Monica Lesches MD Electronically signed by Monica Lesches MD Signature Date/Time: 09/05/2022/9:51:19 PM    Final    MONITORS  LONG TERM MONITOR (3-14 DAYS) 11/16/2020  Narrative  No significant abnormalities  9 days of data recorded on monitor. Patient had a min HR of 54 bpm, max HR of 118 bpm, and avg HR of 76 bpm. Predominant underlying rhythm was Sinus Rhythm. No VT, SVT, atrial fibrillation, high degree block, or  pauses noted. Isolated atrial and ventricular ectopy was rare (<1%). There were 3 triggered events, which corresponded to sinus rhythm.  No significant arrhythmias detected.           *** Risk Assessment/Calculations:    {Does this patient have ATRIAL FIBRILLATION?:[phone]}       Physical Exam:    VS:  BP 139/88 (BP Location: Right  Arm)   Pulse 82   Ht 5\' 1"  (1.549 m)   Wt 166 lb 6.4 oz (75.5 kg)   SpO2 98%   BMI 31.44 kg/m    Wt Readings from Last 3 Encounters:  12/06/22 166 lb 6.4 oz (75.5 kg)  12/06/22 166 lb (75.3 kg)  09/05/22 164 lb (74.4 kg)    Gen: *** distress, *** obese/well nourished/malnourished   Neck: No JVD, *** carotid bruit Ears: *** Frank Sign Cardiac: No Rubs or Gallops, *** Murmur, ***cardia, *** radial pulses Respiratory: Clear to auscultation bilaterally, *** effort, ***  respiratory rate GI: Soft, nontender, non-distended *** MS: No *** edema; *** moves all extremities Integument: Skin feels *** Neuro:  At time of evaluation, alert and oriented to person/place/time/situation *** Psych: Normal affect, patient feels ***   ASSESSMENT AND PLAN: .    ***   Riley Lam, MD FASE [facility] Cardiologist [facility]  [address] [place], [address] [place], [place] [address] [phone]  2:04 PM

## 2022-12-06 NOTE — Progress Notes (Signed)
Cardiology Office Note:  .    Date:  12/06/2022  ID:  Anastasio Champion Eli Rydalch, DOB 01/25/1986, MRN [id number] PCP: Avanell Shackleton, NP-C  [facility] Providers Cardiologist:  Meriam Sprague, MD     CC: Follow up from Dr. Shari Prows.  History of Present Illness: .    [facility] Monica Steele is a 37 y.o. female with GERD who presentes for follow up. 2022: Saw Dr. Bjorn Pippin for palpitations, CP with normal echo and heart monitor. 2023: Saw Dr. Shari Prows for chest pain and palpitations, started tx for GERD 2024: Saw Tessa for palpitations.  Started metoprolol  Discussed the use of AI scribe software for clinical note transcription with the patient, who gave verbal consent to proceed.  Vietnamese interpretor used.  History of Present Illness        Ms. Bartmess, a 37 year old with a history of GERD and cardiac symptoms, presents with ongoing chest discomfort. The discomfort is localized at the bra line. The patient denies frequent shortness of breath. Previously, the patient experienced palpitations, but these have since resolved. The patient was prescribed metoprolol for these cardiac symptoms, but no longer takes this medication. The patient also has a history of migraines, for which she takes rizatriptan. The patient's cholesterol is at the upper limits of normal.  Relevant histories: .  Social From [place], has a daughter ROS: As per HPI.   Studies Reviewed: .   Cardiac Studies & Procedures       ECHOCARDIOGRAM  ECHOCARDIOGRAM COMPLETE 09/05/2022  Narrative ECHOCARDIOGRAM REPORT    Patient Name:   TYMESHIA JANVIER [facility] Date of Exam: 09/05/2022 Medical Rec #:  [id number]           Height:       62.0 in Accession #:    [id number]          Weight:       164.0 lb Date of Birth:  May 13, 1985           BSA:          1.757 m Patient Age:    37 years            BP:           124/82 mmHg Patient Gender: F                   HR:           77 bpm. Exam Location:  [address]  Procedure: 2D Echo, Color Doppler, Cardiac Doppler, 3D Echo and Strain Analysis  Indications:    Chest Pain R07.9  History:        Patient has prior history of Echocardiogram examinations, most recent 10/05/2021.  Sonographer:    [name] RDCS Referring Phys: 44 TESSA N CONTE  IMPRESSIONS   1. Left ventricular ejection fraction, by estimation, is 65 to 70%. Left ventricular ejection fraction by 3D volume is 66 %. The left ventricle has normal function. The left ventricle has no regional wall motion abnormalities. There is mild left ventricular hypertrophy. Left ventricular diastolic parameters were normal. 2. Right ventricular systolic function is normal. The right ventricular size is normal. Tricuspid regurgitation signal is inadequate for assessing PA pressure. 3. The mitral valve is normal in structure. No evidence of mitral valve regurgitation. No evidence of mitral stenosis. 4. The aortic valve is tricuspid. Aortic valve regurgitation is not visualized. No aortic stenosis is present. 5. The inferior vena cava is normal in size with  greater than 50% respiratory variability, suggesting right atrial pressure of 3 mmHg.  FINDINGS Left Ventricle: Left ventricular ejection fraction, by estimation, is 65 to 70%. Left ventricular ejection fraction by 3D volume is 66 %. The left ventricle has normal function. The left ventricle has no regional wall motion abnormalities. The left ventricular internal cavity size was normal in size. There is mild left ventricular hypertrophy. Left ventricular diastolic parameters were normal.  Right Ventricle: The right ventricular size is normal. No increase in right ventricular wall thickness. Right ventricular systolic function is normal. Tricuspid regurgitation signal is inadequate for assessing PA pressure.  Left Atrium: Left atrial size was normal in size.  Right Atrium: Right atrial size was normal in size.  Pericardium: There is no  evidence of pericardial effusion.  Mitral Valve: The mitral valve is normal in structure. No evidence of mitral valve regurgitation. No evidence of mitral valve stenosis.  Tricuspid Valve: The tricuspid valve is normal in structure. Tricuspid valve regurgitation is trivial.  Aortic Valve: The aortic valve is tricuspid. Aortic valve regurgitation is not visualized. No aortic stenosis is present.  Pulmonic Valve: The pulmonic valve was not well visualized. Pulmonic valve regurgitation is trivial.  Aorta: The aortic root and ascending aorta are structurally normal, with no evidence of dilitation.  Venous: The inferior vena cava is normal in size with greater than 50% respiratory variability, suggesting right atrial pressure of 3 mmHg.  IAS/Shunts: The interatrial septum was not well visualized.   LEFT VENTRICLE PLAX 2D LVIDd:         4.00 cm         Diastology LVIDs:         2.50 cm         LV e' medial:    7.01 cm/s LV PW:         1.00 cm         LV E/e' medial:  8.5 LV IVS:        1.00 cm         LV e' lateral:   12.60 cm/s LVOT diam:     2.30 cm         LV E/e' lateral: 4.7 LV SV:         84 LV SV Index:   48 LVOT Area:     4.15 cm        3D Volume EF LV 3D EF:    Left ventricul ar ejection fraction by 3D volume is 66 %.  3D Volume EF: 3D EF:        66 % LV EDV:       79 ml LV ESV:       26 ml LV SV:        52 ml  RIGHT VENTRICLE RV Basal diam:  2.00 cm RV Mid diam:    2.30 cm RV S prime:     10.10 cm/s TAPSE (M-mode): 1.6 cm  LEFT ATRIUM             Index        RIGHT ATRIUM          Index LA diam:        3.10 cm 1.76 cm/m   RA Area:     8.99 cm LA Vol (A2C):   33.3 ml 18.95 ml/m  RA Volume:   15.20 ml 8.65 ml/m LA Vol (A4C):   35.5 ml 20.20 ml/m LA Biplane Vol: 37.4 ml 21.29 ml/m AORTIC VALVE LVOT  Vmax:   106.00 cm/s LVOT Vmean:  71.300 cm/s LVOT VTI:    0.202 m  AORTA Ao Root diam: 3.40 cm Ao Asc diam:  3.00 cm  MITRAL VALVE MV Area (PHT): 3.48  cm    SHUNTS MV Decel Time: 218 msec    Systemic VTI:  0.20 m MV E velocity: 59.60 cm/s  Systemic Diam: 2.30 cm MV A velocity: 51.10 cm/s MV E/A ratio:  1.17  Epifanio Lesches MD Electronically signed by Epifanio Lesches MD Signature Date/Time: 09/05/2022/9:51:19 PM    Final    MONITORS  LONG TERM MONITOR (3-14 DAYS) 11/16/2020  Narrative  No significant abnormalities  9 days of data recorded on monitor. Patient had a min HR of 54 bpm, max HR of 118 bpm, and avg HR of 76 bpm. Predominant underlying rhythm was Sinus Rhythm. No VT, SVT, atrial fibrillation, high degree block, or pauses noted. Isolated atrial and ventricular ectopy was rare (<1%). There were 3 triggered events, which corresponded to sinus rhythm.  No significant arrhythmias detected.            Physical Exam:    VS:  BP 139/88 (BP Location: Right Arm)   Pulse 82   Ht 5\' 1"  (1.549 m)   Wt 166 lb 6.4 oz (75.5 kg)   SpO2 98%   BMI 31.44 kg/m    Wt Readings from Last 3 Encounters:  12/06/22 166 lb 6.4 oz (75.5 kg)  12/06/22 166 lb (75.3 kg)  09/05/22 164 lb (74.4 kg)    Gen: no distress   Neck: No JVD Cardiac: No Rubs or Gallops, no murmur, RRR +2 radial pulses Respiratory: Clear to auscultation bilaterally, normal effort, normal  respiratory rate GI: Soft, nontender, non-distended  MS: No  edema;  moves all extremities Integument: Skin feels warm Neuro:  At time of evaluation, alert and oriented to person/place/time/situation  Psych: Normal affect, patient feels well   ASSESSMENT AND PLAN: .    Chest Pain - Recurrent chest pain at the bra line. No shortness of breath or palpitations. Previous cardiac workup was benign. Patient is no longer taking metoprolol. - pain may be leading to elevated blood pressure reading; if further BP elevations, will need treatment for hypertension -Order stress test to rule out cardiac etiology of chest pain. - given PCOS, may have concomitant  HTN  Palpitations -Change metoprolol to as needed use.  Migraines - Managed with rizatriptan. -Continue current management unless positive stress test  Hyperlipidemia Cholesterol at upper limits of normal at last evaluation. -Monitor cholesterol levels; if we need to see her in the future for the above, at next lab draw recommend once in lifetime LpA screening  PRN unless positive stress test  Monica Lam, MD FASE [facility] Cardiologist [facility]  [address] [place], [place] [address] [phone]  2:12 PM

## 2022-12-06 NOTE — Patient Instructions (Signed)
Labs downstairs before you leave.   You will receive a call to schedule your CT scan.

## 2022-12-06 NOTE — Patient Instructions (Signed)
-   Start meloxicam 15 mg daily x3 weeks.  May use remaining meloxicam as needed once daily for pain control.  Do not to use additional NSAIDs while taking meloxicam.  May use Tylenol [phone] mg 2 to 3 times a day for breakthrough pain. Low back HEP  Pt referral  6 week follow up

## 2022-12-06 NOTE — Addendum Note (Signed)
Addended by: Marinus Maw on: 12/06/2022 12:37 PM   Modules accepted: Orders

## 2022-12-06 NOTE — Patient Instructions (Signed)
Medication Instructions:  Your physician recommends that you continue on your current medications as directed. Please refer to the Current Medication list given to you today.  *If you need a refill on your cardiac medications before your next appointment, please call your pharmacy*   Lab Work: NONE If you have labs (blood work) drawn today and your tests are completely normal, you will receive your results only by: MyChart Message (if you have MyChart) OR A paper copy in the mail If you have any lab test that is abnormal or we need to change your treatment, we will call you to review the results.   Testing/Procedures: Your physician has requested that you have an exercise tolerance test. For further information please visit [email or web address] Please also follow instruction sheet, as given.    Follow-Up:As needed At [facility], you and your health needs are our priority.  As part of our continuing mission to provide you with exceptional heart care, we have created designated Provider Care Teams.  These Care Teams include your primary Cardiologist (physician) and Advanced Practice Providers (APPs -  Physician Assistants and Nurse Practitioners) who all work together to provide you with the care you need, when you need it.    Provider:   Riley Lam, MD

## 2022-12-06 NOTE — Progress Notes (Signed)
Subjective:     Patient ID: Monica Steele Monica Steele, female    DOB: Jan 20, 1986, 37 y.o.   MRN: [id number]  Chief Complaint  Patient presents with   Abdominal Pain    For about a month, all day long stomachache   Nausea    Going on for about a month, feels sick to stomach after eating    Abdominal Pain Associated symptoms include nausea and vomiting. Pertinent negatives include no constipation, diarrhea, dysuria, fever, frequency or weight loss.    Discussed the use of AI scribe software for clinical note transcription with the patient, who gave verbal consent to proceed.  History of Present Illness         C/o a 4 wk hx of worsening peri-umbilical pain. Feels like needles and tenderness to palpation. Pain does not radiate. Also c/o LUQ and RUQ pain.  N/V.  Taking pantoprazole.   Normal bowel movements.   LMP: last week.     Health Maintenance Due  Topic Date Due   HPV VACCINES (2 - 3-dose SCDM series) 09/09/2020    Past Medical History:  Diagnosis Date   Allergy    Dry eyes    Gallbladder polyp    GERD (gastroesophageal reflux disease)    H/O seasonal allergies    Palpitations     Past Surgical History:  Procedure Laterality Date   CHOLECYSTECTOMY N/A 07/31/2022   Procedure: LAPAROSCOPIC CHOLECYSTECTOMY;  Surgeon: Diamantina Monks, MD;  Location: [facility];  Service: General;  Laterality: N/A;   COLONOSCOPY  2021   ESOPHAGOGASTRODUODENOSCOPY ENDOSCOPY  2021    Family History  Problem Relation Age of Onset   Healthy Mother    Healthy Father    Colon cancer Neg Hx    Esophageal cancer Neg Hx    Prostate cancer Neg Hx    Rectal cancer Neg Hx     Social History   Socioeconomic History   Marital status: Married    Spouse name: Not on file   Number of children: 2   Years of education: Not on file   Highest education level: 7th grade  Occupational History   Not on file  Tobacco Use   Smoking status: Never   Smokeless tobacco: Never  Vaping Use    Vaping status: Never Used  Substance and Sexual Activity   Alcohol use: No   Drug use: No   Sexual activity: Yes    Birth control/protection: None  Other Topics Concern   Not on file  Social History Narrative   Lives home with husband, Nigel Sloop, and two children.  1 son and 1 daughter. 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  Education 8th grade.  Caffeine one [employer] daily.   Social Determinants of Health   Financial Resource Strain: Low Risk  (08/06/2022)   Overall Financial Resource Strain (CARDIA)    Difficulty of Paying Living Expenses: Not hard at all  Food Insecurity: No Food Insecurity (08/06/2022)   Hunger Vital Sign    Worried About Running Out of Food in the Last Year: Never true    Ran Out of Food in the Last Year: Never true  Transportation Needs: No Transportation Needs (08/06/2022)   PRAPARE - Administrator, Civil Service (Medical): No    Lack of Transportation (Non-Medical): No  Physical Activity: Unknown (08/06/2022)   Exercise Vital Sign    Days of Exercise per Week: 0 days    Minutes of Exercise per Session: Not  on file  Stress: No Stress Concern Present (08/06/2022)   [employer] of Occupational Health - Occupational Stress Questionnaire    Feeling of Stress : Not at all  Social Connections: Unknown (08/06/2022)   Social Connection and Isolation Panel [NHANES]    Frequency of Communication with Friends and Family: Once a week    Frequency of Social Gatherings with Friends and Family: Once a week    Attends Religious Services: Patient declined    Database administrator or Organizations: No    Attends Engineer, structural: Not on file    Marital Status: Married  Catering manager Violence: Not on file    Outpatient Medications Prior to Visit  Medication Sig Dispense Refill   acetaminophen (TYLENOL) 500 MG tablet Take 2 tablets (1,000 mg total) by mouth 4 (four) times daily. 120 tablet 3   cyclobenzaprine (FLEXERIL) 5 MG tablet Take 1  tablet (5 mg total) by mouth at bedtime. 30 tablet 0   docusate sodium (COLACE) 100 MG capsule Take 1 capsule (100 mg total) by mouth 2 (two) times daily. 60 capsule 2   hyoscyamine (LEVSIN SL) 0.125 MG SL tablet Place 1 tablet (0.125 mg total) under the tongue every 4 (four) hours as needed. 30 tablet 2   meloxicam (MOBIC) 15 MG tablet TAKE 1 TABLET (15 MG TOTAL) BY MOUTH DAILY. 30 tablet 0   methocarbamol (ROBAXIN-750) 750 MG tablet Take 1 tablet (750 mg total) by mouth 4 (four) times daily. 120 tablet 1   omeprazole (PRILOSEC) 40 MG capsule Take 1 capsule (40 mg total) by mouth 2 (two) times daily before a meal. 180 capsule 2   rizatriptan (MAXALT-MLT) 10 MG disintegrating tablet Take 10 mg by mouth as needed for migraine.     metoprolol tartrate (LOPRESSOR) 25 MG tablet Take 0.5 tablets (12.5 mg total) by mouth 2 (two) times daily. 90 tablet 3   oxyCODONE (ROXICODONE) 5 MG immediate release tablet Take 1 tablet (5 mg total) by mouth every 4 (four) hours as needed for severe pain. (Patient not taking: Reported on 12/06/2022) 20 tablet 0   No facility-administered medications prior to visit.    Allergies  Allergen Reactions   Gadolinium Derivatives Nausea And Vomiting    Pt vomited after multihance injection.     Review of Systems  Constitutional:  Negative for chills, fever and weight loss.  Respiratory:  Negative for shortness of breath.   Cardiovascular:  Negative for chest pain, palpitations and leg swelling.  Gastrointestinal:  Positive for abdominal pain, nausea and vomiting. Negative for constipation and diarrhea.  Genitourinary:  Negative for dysuria, frequency and urgency.  Neurological:  Negative for dizziness and focal weakness.       Objective:    Physical Exam Constitutional:      General: She is not in acute distress.    Appearance: She is not ill-appearing.  Eyes:     Extraocular Movements: Extraocular movements intact.     Conjunctiva/sclera: Conjunctivae normal.      Pupils: Pupils are equal, round, and reactive to light.  Cardiovascular:     Rate and Rhythm: Normal rate and regular rhythm.  Pulmonary:     Effort: Pulmonary effort is normal.     Breath sounds: Normal breath sounds.  Abdominal:     Tenderness: There is abdominal tenderness in the right upper quadrant, right lower quadrant, periumbilical area and left upper quadrant. There is no right CVA tenderness, left CVA tenderness, guarding or rebound. Negative signs include  Murphy's sign and McBurney's sign.  Musculoskeletal:     Cervical back: Normal range of motion and neck supple.  Skin:    General: Skin is warm and dry.  Neurological:     General: No focal deficit present.     Mental Status: She is alert and oriented to person, place, and time.  Psychiatric:        Mood and Affect: Mood normal.        Behavior: Behavior normal.        Thought Content: Thought content normal.      BP 102/74 (BP Location: Left Arm, Patient Position: Sitting, Cuff Size: Large)   Pulse 80   Temp 97.6 F (36.4 C) (Temporal)   Ht 5\' 2"  (1.575 m)   Wt 166 lb (75.3 kg)   SpO2 99%   BMI 30.36 kg/m  Wt Readings from Last 3 Encounters:  12/06/22 166 lb (75.3 kg)  09/05/22 164 lb (74.4 kg)  08/31/22 166 lb (75.3 kg)       Assessment & Plan:   Problem List Items Addressed This Visit       Digestive   Gastroesophageal reflux disease without esophagitis   Other Visit Diagnoses     Periumbilical abdominal pain    -  Primary   Relevant Orders   CBC with Differential/Platelet   Comprehensive metabolic panel   CT ABDOMEN PELVIS W CONTRAST   Left upper quadrant abdominal tenderness without rebound tenderness       Relevant Orders   Lipase   CT ABDOMEN PELVIS W CONTRAST   Nausea and vomiting, unspecified vomiting type       Relevant Orders   CT ABDOMEN PELVIS W CONTRAST      Urine preg negative.  UA- 1+bili, 1+protein. Negative UTI or blood Will get labs including lipase, CBC, CMP STAT  CT abdomen pelvis to r/o appendicitis. Other possible etiologies include pancreatitis, gastritis, GERD.  Advised to go to the ED if symptoms are worsening while awaiting lab and CT results.   I have discontinued Laruth Bouchard. Sallie's oxyCODONE. I am also having her maintain her hyoscyamine, acetaminophen, methocarbamol, docusate sodium, rizatriptan, metoprolol tartrate, omeprazole, cyclobenzaprine, and meloxicam.  No orders of the defined types were placed in this encounter.

## 2022-12-07 ENCOUNTER — Ambulatory Visit (HOSPITAL_COMMUNITY): Payer: 59

## 2022-12-07 ENCOUNTER — Encounter: Payer: Self-pay | Admitting: Family Medicine

## 2022-12-11 ENCOUNTER — Ambulatory Visit
Admission: RE | Admit: 2022-12-11 | Discharge: 2022-12-11 | Disposition: A | Discharge status: Home / Self Care | Payer: 59 | Source: Ambulatory Visit | Attending: Family Medicine | Admitting: Family Medicine

## 2022-12-11 DIAGNOSIS — R1033 Periumbilical pain: Secondary | ICD-10-CM | POA: Diagnosis not present

## 2022-12-11 DIAGNOSIS — R112 Nausea with vomiting, unspecified: Secondary | ICD-10-CM | POA: Diagnosis not present

## 2022-12-11 DIAGNOSIS — R10812 Left upper quadrant abdominal tenderness: Secondary | ICD-10-CM | POA: Diagnosis not present

## 2022-12-11 MED ORDER — IOPAMIDOL (ISOVUE-300) INJECTION 61%
500.0000 mL | Freq: Once | INTRAVENOUS | Status: AC | PRN
  Administered 2022-12-11: 100 mL via INTRAVENOUS

## 2022-12-11 NOTE — Progress Notes (Signed)
CT shows some mild urinary bladder wall thickening. Please ask her to come back in for a recheck of her urine. Office visit with interpreter please.

## 2022-12-19 ENCOUNTER — Ambulatory Visit: Payer: 59

## 2022-12-19 ENCOUNTER — Ambulatory Visit (HOSPITAL_COMMUNITY): Payer: 59

## 2022-12-19 ENCOUNTER — Ambulatory Visit: Payer: 59 | Attending: Sports Medicine | Admitting: Physical Therapy

## 2022-12-19 ENCOUNTER — Encounter (HOSPITAL_COMMUNITY): Payer: Self-pay

## 2022-12-19 ENCOUNTER — Other Ambulatory Visit: Payer: Self-pay

## 2022-12-19 ENCOUNTER — Encounter: Payer: Self-pay | Admitting: Physical Therapy

## 2022-12-19 DIAGNOSIS — G8929 Other chronic pain: Secondary | ICD-10-CM | POA: Insufficient documentation

## 2022-12-19 DIAGNOSIS — M5441 Lumbago with sciatica, right side: Secondary | ICD-10-CM | POA: Insufficient documentation

## 2022-12-19 DIAGNOSIS — M5459 Other low back pain: Secondary | ICD-10-CM

## 2022-12-19 DIAGNOSIS — M6281 Muscle weakness (generalized): Secondary | ICD-10-CM

## 2022-12-19 DIAGNOSIS — M25651 Stiffness of right hip, not elsewhere classified: Secondary | ICD-10-CM

## 2022-12-19 NOTE — Therapy (Signed)
OUTPATIENT PHYSICAL THERAPY THORACOLUMBAR EVALUATION   Patient Name: Monica Steele MRN: [id number] DOB:Jan 23, 1986, 37 y.o., female Today's Date: 12/19/2022  END OF SESSION:  PT End of Session - 12/19/22 1612     Visit Number 1    Number of Visits 17    Date for PT Re-Evaluation 02/13/23    Authorization Type aetna    PT Start Time 1613    PT Stop Time 1700    PT Time Calculation (min) 47 min    Activity Tolerance Patient tolerated treatment well;No increased pain    Behavior During Therapy WFL for tasks assessed/performed             Past Medical History:  Diagnosis Date   Allergy    Dry eyes    Gallbladder polyp    GERD (gastroesophageal reflux disease)    H/O seasonal allergies    Palpitations    Past Surgical History:  Procedure Laterality Date   CHOLECYSTECTOMY N/A 07/31/2022   Procedure: LAPAROSCOPIC CHOLECYSTECTOMY;  Surgeon: Diamantina Monks, MD;  Location: [facility];  Service: General;  Laterality: N/A;   COLONOSCOPY  2021   ESOPHAGOGASTRODUODENOSCOPY ENDOSCOPY  2021   Patient Active Problem List   Diagnosis Date Noted   Chest pain of uncertain etiology 12/06/2022   Palpitations 12/06/2022   Migraine 12/06/2022   Pain of left breast 08/07/2022   Abdominal pain, epigastric 09/27/2021   History of gestational diabetes mellitus 07/06/2021   PROM (premature rupture of membranes) 06/04/2021   Normal postpartum course 06/03/2021   Gestational diabetes 06/02/2021   Language barrier 06/02/2021   SVD (06/02/21) 06/02/2021   Gastroesophageal reflux disease without esophagitis 07/12/2020   Dysphagia 07/15/2019   Seasonal allergies 07/15/2019   Numbness of face 07/15/2019   Polycystic ovary syndrome 01/16/2019   Abnormal auditory perception of right ear 06/11/2017   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    PCP: Avanell Shackleton, NP-C  REFERRING PROVIDER: Richardean Sale, DO  REFERRING DIAG: [phone] (ICD-10-CM) - Chronic right-sided  low back pain with right-sided sciatica  Rationale for Evaluation and Treatment: Rehabilitation  THERAPY DIAG:  Other low back pain  Muscle weakness (generalized)  Stiffness of right hip, not elsewhere classified  ONSET DATE: a couple months ago  SUBJECTIVE:                                                                                                                                                                                           SUBJECTIVE STATEMENT: Appreciate assistance of in person interpreter.  Pt endorses gradual onset of pain over past couple of months, no MOI or  prior hx of low back pain. 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 She notes bending elicits RLE pain down to foot. She denies bilateral symptoms, no numbness or tingling. States symptoms seem to be fluctuating since onset.   PERTINENT HISTORY:  GERD, palpitations, migraines Pt notes she is getting cardiac workup Oct 8th  PAIN:  Are you having pain? Yes: NPRS scale: none at rest, up to 8/10 Pain location: R low back, top of hip. Posterior into leg Pain description: aching, spasming Aggravating factors: bending forward, STS, standing still Relieving factors: rest breaks, moving around   PRECAUTIONS: none   WEIGHT BEARING RESTRICTIONS: No  FALLS:  Has patient fallen in last 6 months? No  LIVING ENVIRONMENT: 1 story home, 1 STE. 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  DIAGNOSTIC FINDINGS:  12/11/22 CT Abdomen/Pelvis (refer to EPIC for details) "IMPRESSION: 1. Borderline-mild urinary bladder wall thickening, which may be accentuated by  incomplete distention. Correlate with urinalysis to exclude cystitis. 2. Otherwise, no acute abdominopelvic findings. 3. Chronic sclerosis with small subchondral cysts or erosions associated with the bilateral sacroiliac joints, suggesting sequela of sacroiliitis. This has slightly progressed when compared to the prior CT."  PATIENT SURVEYS:  FOTO deferred given time constraints and language barrier  SCREENING FOR RED FLAGS: Red flag screening reassuring - bowel/bladder changes: pt denies - bucking/overt weakness: pt denies - bilateral LE numbness: pt denies - saddle anesthesia: pt denies  COGNITION: Overall cognitive status: Within functional limits for tasks assessed     SENSATION: Light touch intact BIL LE No clonus either LE   POSTURE: grossly WNL  PALPATION: Concordant tenderness R SIJ  LUMBAR ROM:   AROM eval  Flexion Able to touch feet with pain, reproduces RLE pain  Extension Full, painful   Right lateral flexion   Left lateral flexion   Right rotation Full painless  Left rotation Full painless    (Blank rows = not tested) (Key: WFL = within functional limits not formally assessed, * = concordant pain, s = stiffness/stretching sensation, NT = not tested)   LOWER EXTREMITY ROM:     Active  Right eval Left eval  Hip flexion    Hip extension    Hip internal rotation    Hip external rotation    Knee extension    Knee flexion    (Blank rows = not tested) (Key: WFL = within functional limits not formally assessed, * = concordant pain, s = stiffness/stretching sensation, NT = not tested)  Comments:  reduced functional ER R hip compared to L (crossing legs, figure four)  LOWER EXTREMITY MMT:    MMT Right eval Left eval  Hip flexion 4 * 5  Hip abduction (modified sitting) 4+ * 5  Hip internal rotation 4+ * 5  Hip external rotation 5 5  Knee flexion 4  hamstring cramp 5  Knee extension 5 5  Ankle dorsiflexion 5 5   (Blank rows = not tested) (Key:  WFL = within functional limits not formally assessed, * = concordant pain, s = stiffness/stretching sensation, NT = not tested)  Comments:    LUMBAR  SPECIAL TESTS:  + slump R LE vs L LE   FUNCTIONAL TESTS:  5xSTS: 16.20sec no UE support, increased aching Able to pick up objects from floor with increased pain - noted tendency for heavy lumbar flexion, reduced ROM through knees GAIT: Distance walked: within clinic Assistive device utilized: None Level of assistance: Complete Independence Comments: gait mechanics grossly WNL  TODAY'S TREATMENT:                                                                                                                              [facility] Adult PT Treatment:                                                DATE: 12/19/22 Therapeutic Exercise: Seated piriformis stretch 2x30 Seated march x8 BIL  HEP handout + education with interpreter assistance    PATIENT EDUCATION:  Education details: Pt education on PT impairments, prognosis, and POC. Informed consent. Rationale for interventions, safe/appropriate HEP performance Person educated: Patient with assistance of interpreter Education method: Explanation, Demonstration, Tactile cues, Verbal cues, and Handouts Education comprehension: verbalized understanding, returned demonstration, verbal cues required, tactile cues required, and needs further education    HOME EXERCISE PROGRAM: Access Code: [email or web address] URL: [email or web address] Date: 12/19/2022 Prepared by: Fransisco Hertz  Exercises - Seated March  - 2-3 x daily - 8 reps - Seated Figure 4 Piriformis Stretch  - 2-3 x daily - 2 reps - 30sec hold  ASSESSMENT:  CLINICAL IMPRESSION: Patient is a pleasant 37 y.o. woman who was seen today for physical therapy evaluation and treatment for low back pain w/ RLE referral. Endorses gradual onset a couple of months ago without MOI or prior history of back pain, symptoms fluctuating but are causing  increased pain with household and work tasks. Red flag questioning reassuring today. Describes bending/stooping as most provocative movements with RLE symptoms. On exam pt demonstrates excellent lumbar mobility although it is painful, mild ER stiffness of R hip (painless), and weakness + concordant pain with MMT of RLE. Pt also demonstrates positive slump test on RLE. Symptoms are easily provoked during examination but pt denies any increase in resting pain, tolerates HEP well with increased time spent with education on appropriate performance. Appreciate assistance of in person interpreter. Given pt's mobility and repetitive nature of work tasks, anticipate she would likely benefit from interventions emphasizing lumbopelvic control/stability and hip/knee strengthening to improve tolerance to typical tasks. No adverse events, pt denies any pain on departure. Pt departs today's session in no acute distress, all voiced questions/concerns addressed appropriately from PT perspective.      OBJECTIVE IMPAIRMENTS: decreased activity tolerance, decreased endurance, decreased mobility, decreased ROM, decreased strength, and pain.   ACTIVITY LIMITATIONS: bending, sitting, standing, squatting, and caring for others  PARTICIPATION LIMITATIONS: cleaning, laundry, and occupation  PERSONAL FACTORS: Time  since onset of injury/illness/exacerbation are also affecting patient's functional outcome.   REHAB POTENTIAL: Good  CLINICAL DECISION MAKING: Stable/uncomplicated  EVALUATION COMPLEXITY: Low   GOALS: Goals reviewed with patient? Yes  SHORT TERM GOALS: Target date: 01/16/2023 Pt will demonstrate appropriate understanding and performance of initially prescribed HEP in order to facilitate improved independence with management of symptoms.  Baseline: HEP provided on eval Goal status: INITIAL   2. Pt will report at least 25% decrease in overall pain levels in past week in order to facilitate improved tolerance  to basic ADLs/mobility.   Baseline: 0-8/10  Goal status: INITIAL    LONG TERM GOALS: Target date: 02/13/2023 Pt will demonstrate appropriate performance of final prescribed HEP in order to facilitate improved self-management of symptoms post-discharge.   Baseline: initial HEP prescribed  Goal status: INITIAL    2.  Pt will demonstrate full lumbar flexion AROM without reproduction of LE pain in order to demonstrate improved tolerance to functional movement patterns.  Baseline: full, painful in back and RLE Goal status: INITIAL  3.  Pt will demonstrate grossly symmetrical global LE MMT in order to demonstrate improved strength for functional movements.  Baseline: see MMT chart above Goal status: INITIAL  4. Pt will perform 5xSTS in <12 sec in order to demonstrate reduced fall risk and improved functional independence. (MCID of 2.3sec, age cohort norm 6.2+/-1.3sec per Billie Ruddy et al 2007)   Baseline: 16 sec with increased pain  Goal status: INITIAL   5. Pt will report at least 50% decrease in overall pain levels in past week in order to facilitate improved tolerance to basic ADLs/mobility.   Baseline: 0-8/10  Goal status: INITIAL    PLAN:  PT FREQUENCY: 1-2x/week  PT DURATION: 8 weeks  PLANNED INTERVENTIONS: Therapeutic exercises, Therapeutic activity, Neuromuscular re-education, Balance training, Gait training, Patient/Family education, Self Care, Joint mobilization, Stair training, Aquatic Therapy, Dry Needling, Spinal mobilization, Cryotherapy, Moist heat, Taping, Manual therapy, and Re-evaluation.  PLAN FOR NEXT SESSION: Review/update HEP PRN. Work on [employer] exercises as appropriate with emphasis on lumbopelvic stability, LE strengthening. Symptom modification strategies as indicated/appropriate. Consider addition of nerve glides for distal symptoms   Ashley Murrain PT, DPT 12/19/2022 5:16 PM

## 2022-12-26 ENCOUNTER — Encounter: Payer: Self-pay | Admitting: Family Medicine

## 2022-12-26 ENCOUNTER — Ambulatory Visit (INDEPENDENT_AMBULATORY_CARE_PROVIDER_SITE_OTHER): Payer: 59 | Admitting: Family Medicine

## 2022-12-26 ENCOUNTER — Ambulatory Visit: Payer: 59 | Attending: Internal Medicine

## 2022-12-26 VITALS — BP 104/76 | HR 78 | Temp 97.6°F | Ht 61.0 in | Wt 168.0 lb

## 2022-12-26 DIAGNOSIS — Z603 Acculturation difficulty: Secondary | ICD-10-CM

## 2022-12-26 DIAGNOSIS — R079 Chest pain, unspecified: Secondary | ICD-10-CM | POA: Diagnosis not present

## 2022-12-26 DIAGNOSIS — N3289 Other specified disorders of bladder: Secondary | ICD-10-CM | POA: Diagnosis not present

## 2022-12-26 DIAGNOSIS — R1033 Periumbilical pain: Secondary | ICD-10-CM | POA: Insufficient documentation

## 2022-12-26 DIAGNOSIS — Z758 Other problems related to medical facilities and other health care: Secondary | ICD-10-CM | POA: Diagnosis not present

## 2022-12-26 LAB — POC URINALSYSI DIPSTICK (AUTOMATED)
Bilirubin, UA: NEGATIVE
Blood, UA: POSITIVE
Glucose, UA: NEGATIVE
Ketones, UA: NEGATIVE
Leukocytes, UA: NEGATIVE
Nitrite, UA: NEGATIVE
Protein, UA: POSITIVE — AB
Spec Grav, UA: 1.02 (ref 1.010–1.025)
Urobilinogen, UA: 0.2 U/dL
pH, UA: 6 (ref 5.0–8.0)

## 2022-12-26 NOTE — Patient Instructions (Signed)
You should hear from [facility] office to schedule a visit.

## 2022-12-26 NOTE — Progress Notes (Signed)
Subjective:     Patient ID: Monica Steele [name], female    DOB: 03-Sep-1985, 37 y.o.   MRN: [id number]  Chief Complaint  Patient presents with   Follow-up    F/u regarding CT, shows some mild urinary bladder wall thickening. Rechecking urine    HPI    History of Present Illness          Here to follow up on abnormal CT scan showing urinary wall thickening.  Abdominal pain is always present. No new symptoms.   No urinary symptoms.   Currently having her menstrual cycle.   No fever, chills, chest pain, shortness of breath, nausea, vomiting, diarrhea, constipation,       Health Maintenance Due  Topic Date Due   HPV VACCINES (2 - 3-dose SCDM series) 09/09/2020   COVID-19 Vaccine (3 - 2023-24 season) 11/19/2022    Past Medical History:  Diagnosis Date   Allergy    Dry eyes    Gallbladder polyp    GERD (gastroesophageal reflux disease)    H/O seasonal allergies    Palpitations     Past Surgical History:  Procedure Laterality Date   CHOLECYSTECTOMY N/A 07/31/2022   Procedure: LAPAROSCOPIC CHOLECYSTECTOMY;  Surgeon: Diamantina Monks, MD;  Location: [facility];  Service: General;  Laterality: N/A;   COLONOSCOPY  2021   ESOPHAGOGASTRODUODENOSCOPY ENDOSCOPY  2021    Family History  Problem Relation Age of Onset   Healthy Mother    Healthy Father    Colon cancer Neg Hx    Esophageal cancer Neg Hx    Prostate cancer Neg Hx    Rectal cancer Neg Hx     Social History   Socioeconomic History   Marital status: Married    Spouse name: Not on file   Number of children: 2   Years of education: Not on file   Highest education level: 7th grade  Occupational History   Not on file  Tobacco Use   Smoking status: Never   Smokeless tobacco: Never  Vaping Use   Vaping status: Never Used  Substance and Sexual Activity   Alcohol use: No   Drug use: No   Sexual activity: Yes    Birth control/protection: None  Other Topics Concern   Not on file  Social History  Narrative   Lives home with husband, Nigel Sloop, and two children.  1 son and 1 daughter. 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  Education 8th grade.  Caffeine one [employer] daily.   Social Determinants of Health   Financial Resource Strain: Low Risk  (08/06/2022)   Overall Financial Resource Strain (CARDIA)    Difficulty of Paying Living Expenses: Not hard at all  Food Insecurity: No Food Insecurity (08/06/2022)   Hunger Vital Sign    Worried About Running Out of Food in the Last Year: Never true    Ran Out of Food in the Last Year: Never true  Transportation Needs: No Transportation Needs (08/06/2022)   PRAPARE - Administrator, Civil Service (Medical): No    Lack of Transportation (Non-Medical): No  Physical Activity: Unknown (08/06/2022)   Exercise Vital Sign    Days of Exercise per Week: 0 days    Minutes of Exercise per Session: Not on file  Stress: No Stress Concern Present (08/06/2022)   [employer] of Occupational Health - Occupational Stress Questionnaire    Feeling of Stress : Not at all  Social Connections: Unknown (08/06/2022)  Social Advertising account executive [NHANES]    Frequency of Communication with Friends and Family: Once a week    Frequency of Social Gatherings with Friends and Family: Once a week    Attends Religious Services: Patient declined    Database administrator or Organizations: No    Attends Engineer, structural: Not on file    Marital Status: Married  Catering manager Violence: Not on file    Outpatient Medications Prior to Visit  Medication Sig Dispense Refill   acetaminophen (TYLENOL) 500 MG tablet Take 2 tablets (1,000 mg total) by mouth 4 (four) times daily. 120 tablet 3   cyclobenzaprine (FLEXERIL) 5 MG tablet Take 1 tablet (5 mg total) by mouth at bedtime. 30 tablet 0   docusate sodium (COLACE) 100 MG capsule Take 1 capsule (100 mg total) by mouth 2 (two) times daily. 60 capsule 2   hyoscyamine (LEVSIN SL) 0.125 MG SL  tablet Place 1 tablet (0.125 mg total) under the tongue every 4 (four) hours as needed. 30 tablet 2   meloxicam (MOBIC) 15 MG tablet TAKE 1 TABLET (15 MG TOTAL) BY MOUTH DAILY. 30 tablet 0   meloxicam (MOBIC) 15 MG tablet Take 1 tablet (15 mg total) by mouth daily. 21 tablet 0   methocarbamol (ROBAXIN-750) 750 MG tablet Take 1 tablet (750 mg total) by mouth 4 (four) times daily. 120 tablet 1   pantoprazole (PROTONIX) 40 MG tablet Take 40 mg by mouth daily.     rizatriptan (MAXALT-MLT) 10 MG disintegrating tablet Take 10 mg by mouth as needed for migraine.     metoprolol tartrate (LOPRESSOR) 25 MG tablet Take 0.5 tablets (12.5 mg total) by mouth 2 (two) times daily. 90 tablet 3   No facility-administered medications prior to visit.    Allergies  Allergen Reactions   Gadolinium Derivatives Nausea And Vomiting    Pt vomited after multihance injection.     Review of Systems  Constitutional:  Negative for chills and fever.  Respiratory:  Negative for shortness of breath.   Cardiovascular:  Negative for chest pain, palpitations and leg swelling.  Gastrointestinal:  Positive for abdominal pain. Negative for constipation, diarrhea, nausea and vomiting.  Genitourinary:  Negative for dysuria, frequency and urgency.  Neurological:  Negative for dizziness and focal weakness.       Objective:    Physical Exam Constitutional:      General: She is not in acute distress.    Appearance: She is not ill-appearing.  Eyes:     Extraocular Movements: Extraocular movements intact.     Conjunctiva/sclera: Conjunctivae normal.  Cardiovascular:     Rate and Rhythm: Normal rate.  Pulmonary:     Effort: Pulmonary effort is normal.  Musculoskeletal:     Cervical back: Normal range of motion and neck supple.  Skin:    General: Skin is warm and dry.  Neurological:     General: No focal deficit present.     Mental Status: She is alert and oriented to person, place, and time.  Psychiatric:        Mood  and Affect: Mood normal.        Behavior: Behavior normal.        Thought Content: Thought content normal.      BP 104/76 (BP Location: Left Arm, Patient Position: Sitting, Cuff Size: Large)   Pulse 78   Temp 97.6 F (36.4 C) (Temporal)   Ht 5\' 1"  (1.549 m)   Wt 168 lb (76.2  kg)   LMP 12/25/2022 (Approximate)   SpO2 98%   Breastfeeding No   BMI 31.74 kg/m  Wt Readings from Last 3 Encounters:  12/26/22 168 lb (76.2 kg)  12/06/22 166 lb (75.3 kg)  12/06/22 166 lb 6.4 oz (75.5 kg)       Assessment & Plan:   Problem List Items Addressed This Visit       Genitourinary   Bladder wall thickening - Primary   Relevant Orders   POCT Urinalysis Dipstick (Automated) (Completed)   Urine Culture   Ambulatory referral to Urology     Other   Language barrier   Periumbilical abdominal pain   Relevant Orders   Ambulatory referral to Urology   Medical interpreter on a stick used.  UA negative for infection. Hematuria r/t menses. Urine culture sent.  Referral to urology for mild bladder wall thickening seen on CT.  Continue follow up with GI and sports medicine.   I am having Maebelle Munroe maintain her hyoscyamine, acetaminophen, methocarbamol, docusate sodium, rizatriptan, metoprolol tartrate, cyclobenzaprine, meloxicam, pantoprazole, and meloxicam.  No orders of the defined types were placed in this encounter.

## 2022-12-27 LAB — URINE CULTURE: Result:: NO GROWTH

## 2022-12-29 ENCOUNTER — Other Ambulatory Visit: Payer: Self-pay | Admitting: Sports Medicine

## 2022-12-29 LAB — EXERCISE TOLERANCE TEST
Angina Index: 0
Duke Treadmill Score: 9
Estimated workload: 10.4
Exercise duration (min): 9 min
Exercise duration (sec): 0 s
MPHR: 183 {beats}/min
Peak HR: 166 {beats}/min
Percent HR: 90 %
RPE: 15
Rest HR: 70 {beats}/min
ST Depression (mm): 0 mm

## 2023-01-04 ENCOUNTER — Ambulatory Visit: Payer: 59 | Admitting: Physical Therapy

## 2023-01-05 ENCOUNTER — Other Ambulatory Visit: Payer: Self-pay | Admitting: Diagnostic Neuroimaging

## 2023-01-09 ENCOUNTER — Ambulatory Visit: Payer: 59 | Admitting: Physical Therapy

## 2023-01-09 ENCOUNTER — Encounter: Payer: Self-pay | Admitting: Physical Therapy

## 2023-01-09 DIAGNOSIS — M5441 Lumbago with sciatica, right side: Secondary | ICD-10-CM | POA: Diagnosis not present

## 2023-01-09 DIAGNOSIS — G8929 Other chronic pain: Secondary | ICD-10-CM | POA: Diagnosis not present

## 2023-01-09 DIAGNOSIS — M25651 Stiffness of right hip, not elsewhere classified: Secondary | ICD-10-CM

## 2023-01-09 DIAGNOSIS — M6281 Muscle weakness (generalized): Secondary | ICD-10-CM

## 2023-01-09 DIAGNOSIS — M5459 Other low back pain: Secondary | ICD-10-CM | POA: Diagnosis not present

## 2023-01-09 NOTE — Therapy (Signed)
OUTPATIENT PHYSICAL THERAPY THORACOLUMBAR TREATMENT   Patient Name: Monica Steele MRN: [id number] DOB:[birth year], 37 y.o., female Today's Date: 01/09/2023  END OF SESSION:  PT End of Session - 01/09/23 0718     Visit Number 2    Number of Visits 17    Date for PT Re-Evaluation 02/13/23    Authorization Type aetna    PT Start Time 0718    PT Stop Time 0756    PT Time Calculation (min) 38 min             Past Medical History:  Diagnosis Date   Allergy    Dry eyes    Gallbladder polyp    GERD (gastroesophageal reflux disease)    H/O seasonal allergies    Palpitations    Past Surgical History:  Procedure Laterality Date   CHOLECYSTECTOMY N/A 07/31/2022   Procedure: LAPAROSCOPIC CHOLECYSTECTOMY;  Surgeon: Diamantina Monks, MD;  Location: [facility];  Service: General;  Laterality: N/A;   COLONOSCOPY  2021   ESOPHAGOGASTRODUODENOSCOPY ENDOSCOPY  2021   Patient Active Problem List   Diagnosis Date Noted   Bladder wall thickening 12/26/2022   Periumbilical abdominal pain 12/26/2022   Chest pain of uncertain etiology 12/06/2022   Palpitations 12/06/2022   Migraine 12/06/2022   Pain of left breast 08/07/2022   Abdominal pain, epigastric 09/27/2021   History of gestational diabetes mellitus 07/06/2021   PROM (premature rupture of membranes) 06/04/2021   Normal postpartum course 06/03/2021   Gestational diabetes 06/02/2021   Language barrier 06/02/2021   SVD (06/02/21) 06/02/2021   Gastroesophageal reflux disease without esophagitis 07/12/2020   Dysphagia 07/15/2019   Seasonal allergies 07/15/2019   Numbness of face 07/15/2019   Polycystic ovary syndrome 01/16/2019   Abnormal auditory perception of right ear 06/11/2017   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    PCP: Avanell Shackleton, NP-C  REFERRING PROVIDER: Richardean Sale, DO  REFERRING DIAG: [phone] (ICD-10-CM) - Chronic right-sided low back pain with right-sided sciatica  Rationale  for Evaluation and Treatment: Rehabilitation  THERAPY DIAG:  Other low back pain  Muscle weakness (generalized)  Stiffness of right hip, not elsewhere classified  ONSET DATE: a couple months ago  SUBJECTIVE:                                                                                                                                                                                           SUBJECTIVE STATEMENT: Pain is the same.    EVAL: Appreciate assistance of in person interpreter.  Pt endorses gradual onset of pain over past couple of months, no MOI or prior  hx of low back pain. 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 She notes bending elicits RLE pain down to foot. She denies bilateral symptoms, no numbness or tingling. States symptoms seem to be fluctuating since onset.   PERTINENT HISTORY:  GERD, palpitations, migraines Pt notes she is getting cardiac workup Oct 8th  PAIN:  Are you having pain? Yes: NPRS scale: 5-6/10 Pain location: R low back, top of hip. Posterior into leg Pain description: aching, spasming Aggravating factors: bending forward, STS, standing still Relieving factors: rest breaks, moving around   PRECAUTIONS: none   WEIGHT BEARING RESTRICTIONS: No  FALLS:  Has patient fallen in last 6 months? No  LIVING ENVIRONMENT: 1 story home, 1 STE. 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  DIAGNOSTIC FINDINGS:  12/11/22 CT Abdomen/Pelvis (refer to EPIC for details) "IMPRESSION: 1. Borderline-mild urinary bladder wall thickening, which may be accentuated by incomplete distention. Correlate with  urinalysis to exclude cystitis. 2. Otherwise, no acute abdominopelvic findings. 3. Chronic sclerosis with small subchondral cysts or erosions associated with the bilateral sacroiliac joints, suggesting sequela of sacroiliitis. This has slightly progressed when compared to the prior CT."  PATIENT SURVEYS:  FOTO deferred given time constraints and language barrier  SCREENING FOR RED FLAGS: Red flag screening reassuring - bowel/bladder changes: pt denies - bucking/overt weakness: pt denies - bilateral LE numbness: pt denies - saddle anesthesia: pt denies  COGNITION: Overall cognitive status: Within functional limits for tasks assessed     SENSATION: Light touch intact BIL LE No clonus either LE   POSTURE: grossly WNL  PALPATION: Concordant tenderness R SIJ  LUMBAR ROM:   AROM eval  Flexion Able to touch feet with pain, reproduces RLE pain  Extension Full, painful   Right lateral flexion   Left lateral flexion   Right rotation Full painless  Left rotation Full painless    (Blank rows = not tested) (Key: WFL = within functional limits not formally assessed, * = concordant pain, s = stiffness/stretching sensation, NT = not tested)   LOWER EXTREMITY ROM:     Active  Right eval Left eval  Hip flexion    Hip extension    Hip internal rotation    Hip external rotation    Knee extension    Knee flexion    (Blank rows = not tested) (Key: WFL = within functional limits not formally assessed, * = concordant pain, s = stiffness/stretching sensation, NT = not tested)  Comments:  reduced functional ER R hip compared to L (crossing legs, figure four)  LOWER EXTREMITY MMT:    MMT Right eval Left eval  Hip flexion 4 * 5  Hip abduction (modified sitting) 4+ * 5  Hip internal rotation 4+ * 5  Hip external rotation 5 5  Knee flexion 4  hamstring cramp 5  Knee extension 5 5  Ankle dorsiflexion 5 5   (Blank rows = not tested) (Key: WFL = within functional limits not  formally assessed, * = concordant pain, s = stiffness/stretching sensation, NT = not tested)  Comments:    LUMBAR SPECIAL TESTS:  + slump R  LE vs L LE   FUNCTIONAL TESTS:  5xSTS: 16.20sec no UE support, increased aching Able to pick up objects from floor with increased pain - noted tendency for heavy lumbar flexion, reduced ROM through knees GAIT: Distance walked: within clinic Assistive device utilized: None Level of assistance: Complete Independence Comments: gait mechanics grossly WNL  TODAY'S TREATMENT:                                                                                                                              [facility] Adult PT Treatment:                                                DATE: 01/09/23 Therapeutic Exercise: Seated figure 4 push and pull  LTR SKTC Bridge 5 sec x 10  Clam with blue band  Bridge with blue band PPT 5 sec x 10  Manual Therapy: LAD RLE  Therapeutic Activity: Seated and standing hip hinge education with wooden dowel for feedback    [facility] Adult PT Treatment:                                                DATE: 12/19/22 Therapeutic Exercise: Seated piriformis stretch 2x30 Seated march x8 BIL  HEP handout + education with interpreter assistance    PATIENT EDUCATION:  Education details: Pt education on PT impairments, prognosis, and POC. Informed consent. Rationale for interventions, safe/appropriate HEP performance Person educated: Patient with assistance of interpreter Education method: Explanation, Demonstration, Tactile cues, Verbal cues, and Handouts Education comprehension: verbalized understanding, returned demonstration, verbal cues required, tactile cues required, and needs further education    HOME EXERCISE PROGRAM: Access Code: [email or web address] URL: [email or web address] Date: 12/19/2022 Prepared by: Fransisco Hertz  Exercises - Seated March  - 2-3 x daily - 8 reps - Seated Figure 4 Piriformis Stretch  - 2-3 x  daily - 2 reps - 30sec hold 01/09/23: - Supine Lower Trunk Rotation  - 1 x daily - 7 x weekly - 1 sets - 10 reps - 5-10 hold - Supine Bridge with Resistance Band  - 1 x daily - 7 x weekly - 1-2 sets - 10 reps - 5 hold - Pelvic tilt  - 1 x daily - 7 x weekly - 1-2 sets - 10 reps - 5 hold  ASSESSMENT:  CLINICAL IMPRESSION: Pt arrives reporting no change. Focused education on body mechanics  and establishing core/hip stabilization HEP. Pt responded well to interventions.    EVAL: Patient is a pleasant 37 y.o. woman who was seen today for physical therapy evaluation and treatment for low back pain w/ RLE referral. Endorses gradual onset a couple of months ago without MOI or  prior history of back pain, symptoms fluctuating but are causing increased pain with household and work tasks. Red flag questioning reassuring today. Describes bending/stooping as most provocative movements with RLE symptoms. On exam pt demonstrates excellent lumbar mobility although it is painful, mild ER stiffness of R hip (painless), and weakness + concordant pain with MMT of RLE. Pt also demonstrates positive slump test on RLE. Symptoms are easily provoked during examination but pt denies any increase in resting pain, tolerates HEP well with increased time spent with education on appropriate performance. Appreciate assistance of in person interpreter. Given pt's mobility and repetitive nature of work tasks, anticipate she would likely benefit from interventions emphasizing lumbopelvic control/stability and hip/knee strengthening to improve tolerance to typical tasks. No adverse events, pt denies any pain on departure. Pt departs today's session in no acute distress, all voiced questions/concerns addressed appropriately from PT perspective.      OBJECTIVE IMPAIRMENTS: decreased activity tolerance, decreased endurance, decreased mobility, decreased ROM, decreased strength, and pain.   ACTIVITY LIMITATIONS: bending, sitting,  standing, squatting, and caring for others  PARTICIPATION LIMITATIONS: cleaning, laundry, and occupation  PERSONAL FACTORS: Time since onset of injury/illness/exacerbation are also affecting patient's functional outcome.   REHAB POTENTIAL: Good  CLINICAL DECISION MAKING: Stable/uncomplicated  EVALUATION COMPLEXITY: Low   GOALS: Goals reviewed with patient? Yes  SHORT TERM GOALS: Target date: 01/16/2023 Pt will demonstrate appropriate understanding and performance of initially prescribed HEP in order to facilitate improved independence with management of symptoms.  Baseline: HEP provided on eval Goal status: INITIAL   2. Pt will report at least 25% decrease in overall pain levels in past week in order to facilitate improved tolerance to basic ADLs/mobility.   Baseline: 0-8/10  Goal status: INITIAL    LONG TERM GOALS: Target date: 02/13/2023 Pt will demonstrate appropriate performance of final prescribed HEP in order to facilitate improved self-management of symptoms post-discharge.   Baseline: initial HEP prescribed  Goal status: INITIAL    2.  Pt will demonstrate full lumbar flexion AROM without reproduction of LE pain in order to demonstrate improved tolerance to functional movement patterns.  Baseline: full, painful in back and RLE Goal status: INITIAL  3.  Pt will demonstrate grossly symmetrical global LE MMT in order to demonstrate improved strength for functional movements.  Baseline: see MMT chart above Goal status: INITIAL  4. Pt will perform 5xSTS in <12 sec in order to demonstrate reduced fall risk and improved functional independence. (MCID of 2.3sec, age cohort norm 6.2+/-1.3sec per Billie Ruddy et al 2007)   Baseline: 16 sec with increased pain  Goal status: INITIAL   5. Pt will report at least 50% decrease in overall pain levels in past week in order to facilitate improved tolerance to basic ADLs/mobility.   Baseline: 0-8/10  Goal status: INITIAL    PLAN:  PT  FREQUENCY: 1-2x/week  PT DURATION: 8 weeks  PLANNED INTERVENTIONS: Therapeutic exercises, Therapeutic activity, Neuromuscular re-education, Balance training, Gait training, Patient/Family education, Self Care, Joint mobilization, Stair training, Aquatic Therapy, Dry Needling, Spinal mobilization, Cryotherapy, Moist heat, Taping, Manual therapy, and Re-evaluation.  PLAN FOR NEXT SESSION: Review/update HEP PRN. Work on [employer] exercises as appropriate with emphasis on lumbopelvic stability, LE strengthening. Symptom modification strategies as indicated/appropriate. Consider addition of nerve glides for distal symptoms   Jannette Spanner, PTA 01/09/23 7:56 AM Phone: [phone] Fax: [phone]

## 2023-01-11 ENCOUNTER — Ambulatory Visit: Payer: 59 | Admitting: Physical Therapy

## 2023-01-16 ENCOUNTER — Ambulatory Visit: Payer: 59 | Admitting: Physical Therapy

## 2023-01-16 ENCOUNTER — Ambulatory Visit (INDEPENDENT_AMBULATORY_CARE_PROVIDER_SITE_OTHER): Payer: 59 | Admitting: Urology

## 2023-01-16 ENCOUNTER — Encounter: Payer: Self-pay | Admitting: Urology

## 2023-01-16 ENCOUNTER — Encounter: Payer: Self-pay | Admitting: Physical Therapy

## 2023-01-16 VITALS — BP 117/80 | HR 80 | Ht 61.0 in | Wt 168.0 lb

## 2023-01-16 DIAGNOSIS — M25651 Stiffness of right hip, not elsewhere classified: Secondary | ICD-10-CM

## 2023-01-16 DIAGNOSIS — R9341 Abnormal radiologic findings on diagnostic imaging of renal pelvis, ureter, or bladder: Secondary | ICD-10-CM | POA: Diagnosis not present

## 2023-01-16 DIAGNOSIS — M6281 Muscle weakness (generalized): Secondary | ICD-10-CM | POA: Diagnosis not present

## 2023-01-16 DIAGNOSIS — N3946 Mixed incontinence: Secondary | ICD-10-CM | POA: Insufficient documentation

## 2023-01-16 DIAGNOSIS — M5459 Other low back pain: Secondary | ICD-10-CM | POA: Diagnosis not present

## 2023-01-16 DIAGNOSIS — M5441 Lumbago with sciatica, right side: Secondary | ICD-10-CM | POA: Diagnosis not present

## 2023-01-16 DIAGNOSIS — G8929 Other chronic pain: Secondary | ICD-10-CM | POA: Diagnosis not present

## 2023-01-16 LAB — URINALYSIS, ROUTINE W REFLEX MICROSCOPIC
Bilirubin, UA: NEGATIVE
Glucose, UA: NEGATIVE
Ketones, UA: NEGATIVE
Nitrite, UA: NEGATIVE
Specific Gravity, UA: 1.02 (ref 1.005–1.030)
Urobilinogen, Ur: 0.2 mg/dL (ref 0.2–1.0)
pH, UA: 6.5 (ref 5.0–7.5)

## 2023-01-16 LAB — BLADDER SCAN AMB NON-IMAGING

## 2023-01-16 LAB — MICROSCOPIC EXAMINATION

## 2023-01-16 MED ORDER — GEMTESA 75 MG PO TABS
75.0000 mg | ORAL_TABLET | Freq: Every day | ORAL | Status: DC
Start: 2023-01-16 — End: 2023-02-13

## 2023-01-16 NOTE — Progress Notes (Signed)
Assessment: 1. Mixed incontinence   2. Abnormal CT scan, bladder     Plan: I personally reviewed the patient's chart including provider notes, lab and imaging results. I personally reviewed the CT study from 9/24 with results as noted below.  The findings regarding the bladder are likely due to under-distention of the bladder. Diagnosis and management of stress and urge incontinence discussed.  Options for management of SUI including pelvic floor exercises, biofeedback, pessary, urethral bulking agents, and urethral sling discussed.  Options for management of UI including avoidance of dietary irritants, behavioral modification, medical therapy, neuromodulation, and chemo-denervation discussed. Trial of Gemtesa 75 mg daily.  Samples provided. Bladder diet sheet given. Instructions on Kegel exercises provided. Return to office in 1 month.  Chief Complaint:  Chief Complaint  Patient presents with   Urinary Incontinence    History of Present Illness:  Monica Steele is a 37 y.o. female who is seen in consultation from [place], [place] L, NP-C for evaluation of abdominal pain and bladder wall thickening on CT. She has a recent history of abdominal pain in the periumbilical area.  This has been fairly constant in nature.  She has had associated nausea.  She was evaluated with a CT of the abdomen and pelvis with contrast on 12/11/2022 which showed borderline mild urinary bladder wall thickening. Dipstick urinalysis have been negative.  Urine culture negative x 2.  Reports that the periumbilical pain is increased with attempting to hold her urine.  She does have some relief with voiding.  She does have urgency and urge incontinence.  She also has stress incontinence.  She wears a panty liner daily.  She has to double void to completely empty her bladder.  No dysuria or gross hematuria.  No recent UTIs.  No problems with bowel movements.  An interpreter was used throughout the visit  today.  Past Medical History:  Past Medical History:  Diagnosis Date   Allergy    Dry eyes    Gallbladder polyp    GERD (gastroesophageal reflux disease)    H/O seasonal allergies    Palpitations     Past Surgical History:  Past Surgical History:  Procedure Laterality Date   CHOLECYSTECTOMY N/A 07/31/2022   Procedure: LAPAROSCOPIC CHOLECYSTECTOMY;  Surgeon: Diamantina Monks, MD;  Location: [facility];  Service: General;  Laterality: N/A;   COLONOSCOPY  2021   ESOPHAGOGASTRODUODENOSCOPY ENDOSCOPY  2021    Allergies:  Allergies  Allergen Reactions   Gadolinium Derivatives Nausea And Vomiting    Pt vomited after multihance injection.     Family History:  Family History  Problem Relation Age of Onset   Healthy Mother    Healthy Father    Colon cancer Neg Hx    Esophageal cancer Neg Hx    Prostate cancer Neg Hx    Rectal cancer Neg Hx     Social History:  Social History   Tobacco Use   Smoking status: Never   Smokeless tobacco: Never  Vaping Use   Vaping status: Never Used  Substance Use Topics   Alcohol use: No   Drug use: No    Review of symptoms:  Constitutional:  Negative for unexplained weight loss, night sweats, fever, chills ENT:  Negative for nose bleeds, sinus pain, painful swallowing CV:  Negative for chest pain, shortness of breath, exercise intolerance, palpitations, loss of consciousness Resp:  Negative for cough, wheezing, shortness of breath GI:  Negative for nausea, vomiting, diarrhea, bloody stools GU:  Positives  noted in HPI; otherwise negative for gross hematuria, dysuria Neuro:  Negative for seizures, poor balance, limb weakness, slurred speech Psych:  Negative for lack of energy, depression, anxiety Endocrine:  Negative for polydipsia, polyuria, symptoms of hypoglycemia (dizziness, hunger, sweating) Hematologic:  Negative for anemia, purpura, petechia, prolonged or excessive bleeding, use of anticoagulants  Allergic:  Negative for difficulty  breathing or choking as a result of exposure to anything; no shellfish allergy; no allergic response (rash/itch) to materials, foods  Physical exam: BP 117/80   Pulse 80   Ht 5\' 1"  (1.549 m)   Wt 168 lb (76.2 kg)   LMP 12/25/2022 (Approximate)   BMI 31.74 kg/m  GENERAL APPEARANCE:  Well appearing, well developed, well nourished, NAD HEENT: Atraumatic, Normocephalic, oropharynx clear. NECK: Supple without lymphadenopathy or thyromegaly. LUNGS: Clear to auscultation bilaterally. HEART: Regular Rate and Rhythm without murmurs, gallops, or rubs. ABDOMEN: Soft, non-tender, No Masses. EXTREMITIES: Moves all extremities well.  Without clubbing, cyanosis, or edema. NEUROLOGIC:  Alert and oriented x 3, normal gait, CN II-XII grossly intact.  MENTAL STATUS:  Appropriate. BACK:  Non-tender to palpation.  No CVAT SKIN:  Warm, dry and intact.    Results: U/A:  0-5 WBC, 0-2 RBC  Results for orders placed or performed in visit on 01/16/23 (from the past 24 hour(s))  Bladder Scan (Post Void Residual) in office   Collection Time: 01/16/23 10:56 AM  Result Value Ref Range   Scan Result 25mL

## 2023-01-16 NOTE — Patient Instructions (Signed)

## 2023-01-16 NOTE — Therapy (Signed)
OUTPATIENT PHYSICAL THERAPY THORACOLUMBAR TREATMENT   Patient Name: Monica Steele MRN: [id number] DOB:1986-01-09, 37 y.o., female Today's Date: 01/16/2023  END OF SESSION:  PT End of Session - 01/16/23 0850     Visit Number 3    Number of Visits 17    Date for PT Re-Evaluation 02/13/23    Authorization Type aetna    PT Start Time 0850    PT Stop Time 0936    PT Time Calculation (min) 46 min    Activity Tolerance Patient tolerated treatment well;No increased pain    Behavior During Therapy WFL for tasks assessed/performed              Past Medical History:  Diagnosis Date   Allergy    Dry eyes    Gallbladder polyp    GERD (gastroesophageal reflux disease)    H/O seasonal allergies    Palpitations    Past Surgical History:  Procedure Laterality Date   CHOLECYSTECTOMY N/A 07/31/2022   Procedure: LAPAROSCOPIC CHOLECYSTECTOMY;  Surgeon: Diamantina Monks, MD;  Location: [facility];  Service: General;  Laterality: N/A;   COLONOSCOPY  2021   ESOPHAGOGASTRODUODENOSCOPY ENDOSCOPY  2021   Patient Active Problem List   Diagnosis Date Noted   Bladder wall thickening 12/26/2022   Periumbilical abdominal pain 12/26/2022   Chest pain of uncertain etiology 12/06/2022   Palpitations 12/06/2022   Migraine 12/06/2022   Pain of left breast 08/07/2022   Abdominal pain, epigastric 09/27/2021   History of gestational diabetes mellitus 07/06/2021   PROM (premature rupture of membranes) 06/04/2021   Normal postpartum course 06/03/2021   Gestational diabetes 06/02/2021   Language barrier 06/02/2021   SVD (06/02/21) 06/02/2021   Gastroesophageal reflux disease without esophagitis 07/12/2020   Dysphagia 07/15/2019   Seasonal allergies 07/15/2019   Numbness of face 07/15/2019   Polycystic ovary syndrome 01/16/2019   Abnormal auditory perception of right ear 06/11/2017   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    PCP: Avanell Shackleton, NP-C  REFERRING PROVIDER:  Richardean Sale, DO  REFERRING DIAG: [phone] (ICD-10-CM) - Chronic right-sided low back pain with right-sided sciatica  Rationale for Evaluation and Treatment: Rehabilitation  THERAPY DIAG:  Other low back pain  Muscle weakness (generalized)  Stiffness of right hip, not elsewhere classified  ONSET DATE: a couple months ago  SUBJECTIVE:                                                                                                                                                                                           SUBJECTIVE STATEMENT: Pt with interpreter points to specific pain in  R buttock. About 7/10 pain today.  She also expressed wanted to try the TPDN if offered.    EVAL: Appreciate assistance of in person interpreter.  Pt endorses gradual onset of pain over past couple of months, no MOI or prior hx of low back pain. 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 She notes bending elicits RLE pain down to foot. She denies bilateral symptoms, no numbness or tingling. States symptoms seem to be fluctuating since onset.   PERTINENT HISTORY:  GERD, palpitations, migraines Pt notes she is getting cardiac workup Oct 8th  PAIN:  Are you having pain? Yes: NPRS scale: 5-6/10 Pain location: R low back, top of hip. Posterior into leg Pain description: aching, spasming Aggravating factors: bending forward, STS, standing still Relieving factors: rest breaks, moving around   PRECAUTIONS: none   WEIGHT BEARING RESTRICTIONS: No  FALLS:  Has patient fallen in last 6 months? No  LIVING ENVIRONMENT: 1 story home, 1 STE. 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  DIAGNOSTIC FINDINGS:  12/11/22 CT Abdomen/Pelvis (refer to EPIC for details) "IMPRESSION: 1. Borderline-mild urinary bladder wall thickening, which may be accentuated by incomplete distention. Correlate with urinalysis to exclude cystitis. 2. Otherwise, no acute abdominopelvic findings. 3. Chronic sclerosis with small subchondral cysts or erosions associated with the bilateral sacroiliac joints, suggesting sequela of sacroiliitis. This has slightly progressed when compared to the prior CT."  PATIENT SURVEYS:  FOTO deferred given time constraints and language barrier  SCREENING FOR RED FLAGS: Red flag screening reassuring - bowel/bladder changes: pt denies - bucking/overt weakness: pt denies - bilateral LE numbness: pt denies - saddle anesthesia: pt denies  COGNITION: Overall cognitive status: Within functional limits for tasks assessed     SENSATION: Light touch intact BIL LE No clonus either LE   POSTURE: grossly WNL  PALPATION: Concordant tenderness R SIJ  LUMBAR ROM:   AROM eval  Flexion Able to touch feet with pain, reproduces RLE pain  Extension Full, painful   Right lateral flexion   Left lateral flexion   Right rotation Full painless  Left rotation Full painless    (Blank rows = not tested) (Key: WFL = within functional limits not formally assessed, * = concordant pain, s = stiffness/stretching sensation, NT = not tested)   LOWER EXTREMITY ROM:     Active  Right eval Left eval  Hip flexion    Hip extension    Hip internal rotation    Hip external rotation    Knee extension    Knee flexion    (Blank rows = not tested) (Key: WFL = within functional limits not formally assessed, * = concordant pain, s = stiffness/stretching sensation, NT = not tested)  Comments:  reduced functional ER R hip compared to L (crossing legs, figure four)  LOWER EXTREMITY MMT:    MMT Right eval Left eval  Hip flexion 4 * 5  Hip  abduction (modified sitting) 4+ * 5  Hip internal rotation 4+ * 5  Hip external rotation 5 5  Knee flexion 4  hamstring cramp 5  Knee extension 5 5  Ankle  dorsiflexion 5 5   (Blank rows = not tested) (Key: WFL = within functional limits not formally assessed, * = concordant pain, s = stiffness/stretching sensation, NT = not tested)  Comments:    LUMBAR SPECIAL TESTS:  + slump R LE vs L LE   FUNCTIONAL TESTS:  5xSTS: 16.20sec no UE support, increased aching Able to pick up objects from floor with increased pain - noted tendency for heavy lumbar flexion, reduced ROM through knees GAIT: Distance walked: within clinic Assistive device utilized: None Level of assistance: Complete Independence Comments: gait mechanics grossly WNL  TODAY'S TREATMENT:     [facility] Adult PT Treatment:                                                DATE: 01-16-23 Therapeutic Exercise: LTR SKTC Pelvic tilt Clam with blue band  Bridge  with clam with blue band Hip hinge with dowel Attempted deadlift but improper form  Sit to stand with 15  # KB Reverse lunge Updated HEP Manual Therapy: STW R QL, R SI/ lumbar paraspinals Lumbar rotation with cavitation with  left sidelying MET Trigger Point Dry-Needling performed     by Garen Lah Treatment instructions: Expect mild to moderate muscle soreness. S/S of pneumothorax if dry needled over a lung field, and to seek immediate medical attention should they occur. Patient verbalized understanding of these instructions and education.  Patient Consent Given: Yes Education handout provided: Previously provided Muscles treated: R QL and R lumbar L- 5 , piriformis, gluteals Electrical stimulation performed: No Parameters: N/A Treatment response/outcome: twitch response noted, pt noted relief pt pain decreased to 4/10 Modalities: Moist hot pack to lumbar Self Care: Lifting principles and hip hinge  Education on TPDN                                                                                                                           [facility] Adult PT Treatment:                                                DATE: 01/09/23 Therapeutic Exercise: Seated figure 4 push and pull  LTR SKTC Bridge 5 sec x 10  Clam with blue band  Bridge with blue band PPT 5 sec x 10  Manual Therapy: LAD RLE  Therapeutic Activity: Seated and standing hip hinge education with wooden dowel for feedback    [facility] Adult PT Treatment:  DATE: 12/19/22 Therapeutic Exercise: Seated piriformis stretch 2x30 Seated march x8 BIL  HEP handout + education with interpreter assistance    PATIENT EDUCATION:  Education details: Pt education on PT impairments, prognosis, and POC. Informed consent. Rationale for interventions, safe/appropriate HEP performance Person educated: Patient with assistance of interpreter Education method: Explanation, Demonstration, Tactile cues, Verbal cues, and Handouts Education comprehension: verbalized understanding, returned demonstration, verbal cues required, tactile cues required, and needs further education    HOME EXERCISE PROGRAM: Access Code: [email or web address]  updated URL: [email or web address] Date: 01/16/2023 Prepared by: Garen Lah  Exercises - Pelvic tilt  - 1 x daily - 7 x weekly - 1-2 sets - 10 reps - 5 hold - Seated March  - 2-3 x daily - 8 reps - Seated Figure 4 Piriformis Stretch  - 2-3 x daily - 2 reps - 30sec hold - Supine Lower Trunk Rotation  - 1 x daily - 7 x weekly - 1 sets - 10 reps - 5-10 hold - Bridge with Hip Abduction and Resistance  - 1 x daily - 7 x weekly - 3 sets - 10 reps - Standing Hip Hinge with Dowel  - 1 x daily - 7 x weekly - 3 sets - 10 reps - Goblet Squat with Kettlebell  - 1 x daily - 7 x weekly - 3 sets - 10 reps   ASSESSMENT:  CLINICAL IMPRESSION: Pt arrives reporting 7/10 Focused education on body mechanics lifting and updated HEP to  progress strengthening. Pt consented to TPDN and was closely monitored throughout session with decrease in pain to 4/10.  Pt HEP updated with more challenging strengthening exercises and reinforcing good body mechanics for lifting.  Pt left with all questions answered through interpreter.  Pt expressed fatigue and end of session   EVAL: Patient is a pleasant 37 y.o. woman who was seen today for physical therapy evaluation and treatment for low back pain w/ RLE referral. Endorses gradual onset a couple of months ago without MOI or prior history of back pain, symptoms fluctuating but are causing increased pain with household and work tasks. Red flag questioning reassuring today. Describes bending/stooping as most provocative movements with RLE symptoms. On exam pt demonstrates excellent lumbar mobility although it is painful, mild ER stiffness of R hip (painless), and weakness + concordant pain with MMT of RLE. Pt also demonstrates positive slump test on RLE. Symptoms are easily provoked during examination but pt denies any increase in resting pain, tolerates HEP well with increased time spent with education on appropriate performance. Appreciate assistance of in person interpreter. Given pt's mobility and repetitive nature of work tasks, anticipate she would likely benefit from interventions emphasizing lumbopelvic control/stability and hip/knee strengthening to improve tolerance to typical tasks. No adverse events, pt denies any pain on departure. Pt departs today's session in no acute distress, all voiced questions/concerns addressed appropriately from PT perspective.      OBJECTIVE IMPAIRMENTS: decreased activity tolerance, decreased endurance, decreased mobility, decreased ROM, decreased strength, and pain.   ACTIVITY LIMITATIONS: bending, sitting, standing, squatting, and caring for others  PARTICIPATION LIMITATIONS: cleaning, laundry, and occupation  PERSONAL FACTORS: Time since onset of  injury/illness/exacerbation are also affecting patient's functional outcome.   REHAB POTENTIAL: Good  CLINICAL DECISION MAKING: Stable/uncomplicated  EVALUATION COMPLEXITY: Low   GOALS: Goals reviewed with patient? Yes  SHORT TERM GOALS: Target date: 01/16/2023 Pt will demonstrate appropriate understanding and performance of initially prescribed HEP in order to facilitate improved independence with management of symptoms.  Baseline:  HEP provided on eval Goal status: MET  2. Pt will report at least 25% decrease in overall pain levels in past week in order to facilitate improved tolerance to basic ADLs/mobility.   Baseline: 0-8/10 01-16-23 at end or session 4/10  Goal status: MET  LONG TERM GOALS: Target date: 02/13/2023 Pt will demonstrate appropriate performance of final prescribed HEP in order to facilitate improved self-management of symptoms post-discharge.   Baseline: initial HEP prescribed  Goal status: ONGOING   2.  Pt will demonstrate full lumbar flexion AROM without reproduction of LE pain in order to demonstrate improved tolerance to functional movement patterns.  Baseline: full, painful in back and RLE Goal status: ONGOING  3.  Pt will demonstrate grossly symmetrical global LE MMT in order to demonstrate improved strength for functional movements.  Baseline: see MMT chart above Goal status: ONGOING  4. Pt will perform 5xSTS in <12 sec in order to demonstrate reduced fall risk and improved functional independence. (MCID of 2.3sec, age cohort norm 6.2+/-1.3sec per Billie Ruddy et al 2007)   Baseline: 16 sec with increased pain  Goal status: ONGOING  5. Pt will report at least 50% decrease in overall pain levels in past week in order to facilitate improved tolerance to basic ADLs/mobility.   Baseline: 0-8/10  Goal status: ONGOING  PLAN:  PT FREQUENCY: 1-2x/week  PT DURATION: 8 weeks  PLANNED INTERVENTIONS: Therapeutic exercises, Therapeutic activity, Neuromuscular  re-education, Balance training, Gait training, Patient/Family education, Self Care, Joint mobilization, Stair training, Aquatic Therapy, Dry Needling, Spinal mobilization, Cryotherapy, Moist heat, Taping, Manual therapy, and Re-evaluation.  PLAN FOR NEXT SESSION: Review/update HEP PRN. Work on [employer] exercises as appropriate with emphasis on lumbopelvic stability, LE strengthening. Symptom modification strategies as indicated/appropriate. Consider addition of nerve glides for distal symptoms   Garen Lah, PT, ATRIC Certified Exercise Expert for the Aging Adult  01/16/23 9:48 AM Phone: [phone] Fax: [phone]

## 2023-01-18 ENCOUNTER — Ambulatory Visit: Payer: 59 | Admitting: Physical Therapy

## 2023-01-18 NOTE — Therapy (Addendum)
OUTPATIENT PHYSICAL THERAPY THORACOLUMBAR TREATMENT + NO VISIT DISCHARGE SUMMARY (see below)    Patient Name: Monica Steele MRN: [id number] DOB:May 29, 1985, 37 y.o., female Today's Date: 01/19/2023  END OF SESSION:  PT End of Session - 01/19/23 0721     Visit Number 4    Number of Visits 17    Date for PT Re-Evaluation 02/13/23    Authorization Type aetna    PT Start Time [phone]    PT Stop Time 0800    PT Time Calculation (min) 42 min    Activity Tolerance Patient tolerated treatment well;No increased pain    Behavior During Therapy WFL for tasks assessed/performed               Past Medical History:  Diagnosis Date   Allergy    Dry eyes    Gallbladder polyp    GERD (gastroesophageal reflux disease)    H/O seasonal allergies    Palpitations    Past Surgical History:  Procedure Laterality Date   CHOLECYSTECTOMY N/A 07/31/2022   Procedure: LAPAROSCOPIC CHOLECYSTECTOMY;  Surgeon: Diamantina Monks, MD;  Location: [facility];  Service: General;  Laterality: N/A;   COLONOSCOPY  2021   ESOPHAGOGASTRODUODENOSCOPY ENDOSCOPY  2021   Patient Active Problem List   Diagnosis Date Noted   Mixed incontinence 01/16/2023   Bladder wall thickening 12/26/2022   Periumbilical abdominal pain 12/26/2022   Chest pain of uncertain etiology 12/06/2022   Palpitations 12/06/2022   Migraine 12/06/2022   Pain of left breast 08/07/2022   Abdominal pain, epigastric 09/27/2021   History of gestational diabetes mellitus 07/06/2021   PROM (premature rupture of membranes) 06/04/2021   Normal postpartum course 06/03/2021   Gestational diabetes 06/02/2021   Language barrier 06/02/2021   SVD (06/02/21) 06/02/2021   Gastroesophageal reflux disease without esophagitis 07/12/2020   Dysphagia 07/15/2019   Seasonal allergies 07/15/2019   Numbness of face 07/15/2019   Polycystic ovary syndrome 01/16/2019   Abnormal auditory perception of right ear 06/11/2017   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 The TPDN was helpful. She notes experiencing pain last night when turning in bed, but after completing bridges the pain resolved.    EVAL: Appreciate assistance of in person interpreter.  Pt endorses gradual onset of pain over past couple of months, no MOI or prior hx of low back pain. 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 She notes bending elicits RLE pain down to foot. She denies bilateral symptoms, no numbness or tingling. States symptoms seem to be fluctuating since onset.   PERTINENT HISTORY:  GERD, palpitations, migraines Pt notes she is getting cardiac workup Oct 8th  PAIN:  Are you having pain? Yes: NPRS scale: 5-6/10 Pain location: R low back, top of hip. Posterior into leg Pain description: aching, spasming Aggravating factors: bending forward, STS, standing still Relieving factors: rest breaks, moving around   PRECAUTIONS: none   WEIGHT BEARING RESTRICTIONS: No  FALLS:  Has patient fallen in last 6 months? No  LIVING ENVIRONMENT: 1 story home, 1 STE. 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  DIAGNOSTIC FINDINGS:  12/11/22 CT Abdomen/Pelvis (refer to EPIC for details) "IMPRESSION: 1. Borderline-mild urinary bladder wall thickening, which may be accentuated by incomplete distention. Correlate with urinalysis to exclude cystitis. 2. Otherwise, no acute abdominopelvic findings. 3. Chronic sclerosis with small subchondral cysts or erosions associated with the bilateral sacroiliac joints, suggesting sequela of sacroiliitis. This has slightly progressed when compared to the prior CT."  PATIENT SURVEYS:  FOTO deferred given time constraints and language barrier  SCREENING FOR RED FLAGS: Red flag screening reassuring - bowel/bladder changes: pt denies - bucking/overt weakness: pt denies - bilateral LE numbness: pt denies - saddle anesthesia: pt denies  COGNITION: Overall cognitive status: Within functional limits for tasks assessed     SENSATION: Light touch intact BIL LE No clonus either LE   POSTURE: grossly WNL  PALPATION: Concordant tenderness R SIJ  LUMBAR ROM:   AROM eval  Flexion Able to touch feet with pain, reproduces RLE pain  Extension Full, painful   Right lateral flexion   Left lateral flexion   Right rotation Full painless  Left rotation Full painless    (Blank rows = not tested) (Key: WFL = within functional limits not formally assessed, * = concordant pain, s = stiffness/stretching sensation, NT = not tested)   LOWER EXTREMITY ROM:     Active  Right eval Left eval  Hip flexion    Hip extension    Hip internal rotation    Hip external rotation    Knee extension    Knee flexion    (Blank rows = not tested) (Key: WFL = within functional limits not formally assessed, * = concordant pain, s = stiffness/stretching sensation, NT = not tested)  Comments:  reduced functional  ER R hip compared to L (crossing legs, figure four)  LOWER EXTREMITY MMT:    MMT Right eval Left eval  Hip flexion 4 * 5  Hip abduction (modified sitting) 4+ * 5  Hip internal rotation 4+ * 5  Hip  external rotation 5 5  Knee flexion 4  hamstring cramp 5  Knee extension 5 5  Ankle dorsiflexion 5 5   (Blank rows = not tested) (Key: WFL = within functional limits not formally assessed, * = concordant pain, s = stiffness/stretching sensation, NT = not tested)  Comments:    LUMBAR SPECIAL TESTS:  + slump R LE vs L LE   FUNCTIONAL TESTS:  5xSTS: 16.20sec no UE support, increased aching Able to pick up objects from floor with increased pain - noted tendency for heavy lumbar flexion, reduced ROM through knees GAIT: Distance walked: within clinic Assistive device utilized: None Level of assistance: Complete Independence Comments: gait mechanics grossly WNL  TODAY'S TREATMENT:     [facility] Adult PT Treatment:                                                DATE: 01/19/23 Therapeutic Exercise: R lateral shift on the wall x10 PPT x15 Clam with blue band x15 Bridge  with clam with blue band x15 Hip hinge with dowel Sit to stand with 15  # KB SKTC x2 20" LTR x5 Manual Therapy: STM to the R lumbar paraspinals and QL R grade 3 UPA to L5  [facility] Adult PT Treatment:                                                DATE: 01-16-23 Therapeutic Exercise: LTR SKTC Pelvic tilt Clam with blue band  Bridge  with clam with blue band Hip hinge with dowel Attempted deadlift but improper form  Sit to stand with 15  # KB Reverse lunge Updated HEP Manual Therapy: STW R QL, R SI/ lumbar paraspinals Lumbar rotation with cavitation with  left sidelying MET Trigger Point Dry-Needling performed     by Garen Lah Treatment instructions: Expect mild to moderate muscle soreness. S/S of pneumothorax if dry needled over a lung field, and to seek immediate medical attention should they occur. Patient  verbalized understanding of these instructions and education.  Patient Consent Given: Yes Education handout provided: Previously provided Muscles treated: R QL and R lumbar L- 5 , piriformis, gluteals Electrical stimulation performed: No Parameters: N/A Treatment response/outcome: twitch response noted, pt noted relief pt pain decreased to 4/10 Modalities: Moist hot pack to lumbar Self Care: Lifting principles and hip hinge  Education on TPDN                                                                                                                          [facility] Adult PT Treatment:  DATE: 01/09/23 Therapeutic Exercise: Seated figure 4 push and pull  LTR SKTC Bridge 5 sec x 10  Clam with blue band  Bridge with blue band PPT 5 sec x 10  Manual Therapy: LAD RLE  Therapeutic Activity: Seated and standing hip hinge education with wooden dowel for feedback    [facility] Adult PT Treatment:                                                DATE: 12/19/22 Therapeutic Exercise: Seated piriformis stretch 2x30 Seated march x8 BIL  HEP handout + education with interpreter assistance    PATIENT EDUCATION:  Education details: Pt education on PT impairments, prognosis, and POC. Informed consent. Rationale for interventions, safe/appropriate HEP performance Person educated: Patient with assistance of interpreter Education method: Explanation, Demonstration, Tactile cues, Verbal cues, and Handouts Education comprehension: verbalized understanding, returned demonstration, verbal cues required, tactile cues required, and needs further education    HOME EXERCISE PROGRAM: Access Code: [email or web address] URL: [email or web address] Date: 01/19/2023 Prepared by: Joellyn Rued  Exercises - Pelvic tilt  - 1 x daily - 7 x weekly - 1-2 sets - 10 reps - 5 hold - Seated March  - 2-3 x daily - 8 reps - Seated Figure 4 Piriformis Stretch  - 2-3 x daily -  2 reps - 30sec hold - Supine Lower Trunk Rotation  - 1 x daily - 7 x weekly - 1 sets - 10 reps - 5-10 hold - Bridge with Hip Abduction and Resistance  - 1 x daily - 7 x weekly - 3 sets - 10 reps - Standing Hip Hinge with Dowel  - 1 x daily - 7 x weekly - 3 sets - 10 reps - Goblet Squat with Kettlebell  - 1 x daily - 7 x weekly - 3 sets - 10 reps - Right Standing Lateral Shift Correction at Wall - Repetitions  - 2 x daily - 7 x weekly - 1 sets - 10 reps - 3 hold   ASSESSMENT:  CLINICAL IMPRESSION: R low back pain has been much better since the last PT session. With palpation, pt is tender laterally of the R L5 vertebrae and to the SI jt. Grade 3 UPAs mobs to the L5 and STM to the R lumbar paraspinals and QL helped to decrease this tenderness. A R lat shift therex was also completed and added to the pt's HEP. Continued therex for lumbopelvic strengthening with emphasis on hinged hip STS. Pt returned proper demonstration fo hinged hip STS. Pt is responding well to PT intervention. Pt will continue to benefit from skilled PT to address impairments for improved back function with less pain.  EVAL: Patient is a pleasant 37 y.o. woman who was seen today for physical therapy evaluation and treatment for low back pain w/ RLE referral. Endorses gradual onset a couple of months ago without MOI or prior history of back pain, symptoms fluctuating but are causing increased pain with household and work tasks. Red flag questioning reassuring today. Describes bending/stooping as most provocative movements with RLE symptoms. On exam pt demonstrates excellent lumbar mobility although it is painful, mild ER stiffness of R hip (painless), and weakness + concordant pain with MMT of RLE. Pt also demonstrates positive slump test on RLE. Symptoms are easily provoked during examination but pt denies any increase in resting pain, tolerates HEP well  with increased time spent with education on appropriate performance. Appreciate  assistance of in person interpreter. Given pt's mobility and repetitive nature of work tasks, anticipate she would likely benefit from interventions emphasizing lumbopelvic control/stability and hip/knee strengthening to improve tolerance to typical tasks. No adverse events, pt denies any pain on departure. Pt departs today's session in no acute distress, all voiced questions/concerns addressed appropriately from PT perspective.      OBJECTIVE IMPAIRMENTS: decreased activity tolerance, decreased endurance, decreased mobility, decreased ROM, decreased strength, and pain.   ACTIVITY LIMITATIONS: bending, sitting, standing, squatting, and caring for others  PARTICIPATION LIMITATIONS: cleaning, laundry, and occupation  PERSONAL FACTORS: Time since onset of injury/illness/exacerbation are also affecting patient's functional outcome.   REHAB POTENTIAL: Good  CLINICAL DECISION MAKING: Stable/uncomplicated  EVALUATION COMPLEXITY: Low   GOALS: Goals reviewed with patient? Yes  SHORT TERM GOALS: Target date: 01/16/2023 Pt will demonstrate appropriate understanding and performance of initially prescribed HEP in order to facilitate improved independence with management of symptoms.  Baseline: HEP provided on eval Goal status: MET  2. Pt will report at least 25% decrease in overall pain levels in past week in order to facilitate improved tolerance to basic ADLs/mobility.   Baseline: 0-8/10 01-16-23 at end or session 4/10  Goal status: MET  LONG TERM GOALS: Target date: 02/13/2023 Pt will demonstrate appropriate performance of final prescribed HEP in order to facilitate improved self-management of symptoms post-discharge.   Baseline: initial HEP prescribed  Goal status: ONGOING   2.  Pt will demonstrate full lumbar flexion AROM without reproduction of LE pain in order to demonstrate improved tolerance to functional movement patterns.  Baseline: full, painful in back and RLE Goal status:  ONGOING  3.  Pt will demonstrate grossly symmetrical global LE MMT in order to demonstrate improved strength for functional movements.  Baseline: see MMT chart above Goal status: ONGOING  4. Pt will perform 5xSTS in <12 sec in order to demonstrate reduced fall risk and improved functional independence. (MCID of 2.3sec, age cohort norm 6.2+/-1.3sec per Billie Ruddy et al 2007)   Baseline: 16 sec with increased pain  Goal status: ONGOING  5. Pt will report at least 50% decrease in overall pain levels in past week in order to facilitate improved tolerance to basic ADLs/mobility.   Baseline: 0-8/10  Goal status: ONGOING  PLAN:  PT FREQUENCY: 1-2x/week  PT DURATION: 8 weeks  PLANNED INTERVENTIONS: Therapeutic exercises, Therapeutic activity, Neuromuscular re-education, Balance training, Gait training, Patient/Family education, Self Care, Joint mobilization, Stair training, Aquatic Therapy, Dry Needling, Spinal mobilization, Cryotherapy, Moist heat, Taping, Manual therapy, and Re-evaluation.  PLAN FOR NEXT SESSION: Review/update HEP PRN. Work on [employer] exercises as appropriate with emphasis on lumbopelvic stability, LE strengthening. Symptom modification strategies as indicated/appropriate. Consider addition of nerve glides for distal symptoms  Joellyn Rued MS, PT 01/19/23 10:20 AM      Discharge addendum 02/22/2023  PHYSICAL THERAPY DISCHARGE SUMMARY  Visits from Start of Care: 4  Current functional level related to goals / functional outcomes: Unable to be assessed   Remaining deficits: Unable to be assessed   Education / Equipment: Unable to be assessed  Patient goals were unable to be assessed. Patient is being discharged due to not returning since the last visit.   Ashley Murrain PT, DPT 02/22/2023 9:02 AM

## 2023-01-19 ENCOUNTER — Ambulatory Visit: Payer: 59 | Attending: Sports Medicine

## 2023-01-19 DIAGNOSIS — M5459 Other low back pain: Secondary | ICD-10-CM | POA: Diagnosis not present

## 2023-01-19 DIAGNOSIS — M25651 Stiffness of right hip, not elsewhere classified: Secondary | ICD-10-CM | POA: Diagnosis not present

## 2023-01-19 DIAGNOSIS — M6281 Muscle weakness (generalized): Secondary | ICD-10-CM | POA: Insufficient documentation

## 2023-02-13 ENCOUNTER — Encounter: Payer: Self-pay | Admitting: Urology

## 2023-02-13 ENCOUNTER — Ambulatory Visit (INDEPENDENT_AMBULATORY_CARE_PROVIDER_SITE_OTHER): Payer: 59 | Admitting: Urology

## 2023-02-13 VITALS — BP 121/62 | HR 80 | Ht 61.0 in | Wt 165.0 lb

## 2023-02-13 DIAGNOSIS — N3946 Mixed incontinence: Secondary | ICD-10-CM

## 2023-02-13 LAB — URINALYSIS, ROUTINE W REFLEX MICROSCOPIC
Bilirubin, UA: NEGATIVE
Glucose, UA: NEGATIVE
Ketones, UA: NEGATIVE
Leukocytes,UA: NEGATIVE
Nitrite, UA: NEGATIVE
RBC, UA: NEGATIVE
Specific Gravity, UA: 1.025 (ref 1.005–1.030)
Urobilinogen, Ur: 0.2 mg/dL (ref 0.2–1.0)
pH, UA: 6.5 (ref 5.0–7.5)

## 2023-02-13 LAB — MICROSCOPIC EXAMINATION

## 2023-02-13 MED ORDER — GEMTESA 75 MG PO TABS: 75.0000 mg | ORAL_TABLET | Freq: Every day | ORAL | 11 refills | Status: DC

## 2023-02-13 NOTE — Progress Notes (Signed)
Assessment: 1. Mixed incontinence     Plan: Continue Gemtesa 75 mg daily.  Samples and rx provided. Continue bladder diet  Continue Kegel exercises  Return to office in 3 months  Chief Complaint:  Chief Complaint  Patient presents with   Urinary Incontinence    History of Present Illness:  Monica Steele is a 37 y.o. female who is seen for further evaluation of mixed urinary incontinence. At her initial visit in 10/24, she reported a recent history of abdominal pain in the periumbilical area.  Her symptoms were fairly constant in nature.  She had associated nausea.  She was evaluated with a CT of the abdomen and pelvis with contrast on 12/11/2022 which showed borderline mild urinary bladder wall thickening. Dipstick urinalysis have been negative.  Urine culture negative x 2.  She reported that the periumbilical pain increased with attempting to hold her urine and had some relief with voiding.  She reported urgency, urge incontinence, and stress incontinence.  She wears a panty liner daily.  She has to double void to completely empty her bladder.  No dysuria or gross hematuria.  No recent UTIs.  No problems with bowel movements.  She was given a trial of Gemtesa 75 mg at her visit in 10/24.  She returns today for follow-up.  She reports improvement in her urinary symptoms with Gemtesa.  She was not having any bladder discomfort.  Her urgency and incontinence also improved.  No side effects from the medication.  No dysuria or gross hematuria.  She would like to continue the medication.  An interpreter was used throughout the visit today.  Past Medical History:  Past Medical History:  Diagnosis Date   Allergy    Dry eyes    Gallbladder polyp    GERD (gastroesophageal reflux disease)    H/O seasonal allergies    Palpitations     Past Surgical History:  Past Surgical History:  Procedure Laterality Date   CHOLECYSTECTOMY N/A 07/31/2022   Procedure: LAPAROSCOPIC  CHOLECYSTECTOMY;  Surgeon: Diamantina Monks, MD;  Location: [facility];  Service: General;  Laterality: N/A;   COLONOSCOPY  2021   ESOPHAGOGASTRODUODENOSCOPY ENDOSCOPY  2021    Allergies:  Allergies  Allergen Reactions   Gadolinium Derivatives Nausea And Vomiting    Pt vomited after multihance injection.     Family History:  Family History  Problem Relation Age of Onset   Healthy Mother    Healthy Father    Colon cancer Neg Hx    Esophageal cancer Neg Hx    Prostate cancer Neg Hx    Rectal cancer Neg Hx     Social History:  Social History   Tobacco Use   Smoking status: Never   Smokeless tobacco: Never  Vaping Use   Vaping status: Never Used  Substance Use Topics   Alcohol use: No   Drug use: No    ROS: Constitutional:  Negative for fever, chills, weight loss CV: Negative for chest pain, previous MI, hypertension Respiratory:  Negative for shortness of breath, wheezing, sleep apnea, frequent cough GI:  Negative for nausea, vomiting, bloody stool, GERD  Physical exam: BP 121/62   Pulse 80   Ht 5\' 1"  (1.549 m)   Wt 165 lb (74.8 kg)   BMI 31.18 kg/m  GENERAL APPEARANCE:  Well appearing, well developed, well nourished, NAD HEENT:  Atraumatic, normocephalic, oropharynx clear NECK:  Supple without lymphadenopathy or thyromegaly ABDOMEN:  Soft, non-tender, no masses EXTREMITIES:  Moves all extremities  well, without clubbing, cyanosis, or edema NEUROLOGIC:  Alert and oriented x 3, normal gait, CN II-XII grossly intact MENTAL STATUS:  appropriate BACK:  Non-tender to palpation, No CVAT SKIN:  Warm, dry, and intact  Results: U/A: 1+ protein, 0-2 RBC, few bacteria

## 2023-02-19 NOTE — Progress Notes (Unsigned)
02/19/2023 [facility] Monica Steele Steele [id number] 1985/06/08   Chief Complaint:  History of Present Illness:  Monica Steele Steele is a 37 year old female with a past medical history of GERD, globus sensation, right upper quadrant abdominal pain, constipation and colon polyps.  She previously underwent an extensive GI evaluation by Dr. Orvan Falconer as summarized below.  Last seen by Dr. Orvan Falconer in office 05/22/2021,   Predominantly post-prandial upper abdominal pain with history of H pylori negative gastritis and gastric erosion: Symptoms now present for >2 years.  No other source identified on ultrasound, CT, EGD, or colonoscopy. Liver enzymes and pancreatic enzymes have been normal. Must consider symptomatic gallbladder disease, although symptoms are slightly atypical. I have encouraged her to use the hyoscyamine more regularly between now and a HIDA scan. Will focus on treatment of functional dyspepsia if HIDA is negative.    H pylori breath test negative 10/01/18 Normal CMP including calcium of 10 10/01/18 Labs from 02/19/19: normal including lipase and liver enzymes Barium esophagram 11/07/18: normal  Abdominal ultrasound 03/05/19: several small gallbladder polyps, largest measuring 4mm. Gallbladder appeared normal.  EGD 05/21/19: gastric erosions and chronic gastritis.  There was no H. pylori or intestinal metaplasia.  Duodenal and esophageal biopsies were normal. Colonoscopy 05/21/19: tubular adenoma, 2 hyperplastic polyps, nonbleeding internal hemorrhoids     Latest Ref Rng & Units 12/06/2022   12:03 PM 07/25/2022    1:45 PM 11/15/2021    1:36 PM  CBC  WBC 4.0 - 10.5 K/uL 8.9  9.2  7.3   Hemoglobin 12.0 - 15.0 g/dL 29.5  28.4  13.2   Hematocrit 36.0 - 46.0 % 44.1  45.1  44.6   Platelets 150.0 - 400.0 K/uL 381.0  349  340         Latest Ref Rng & Units 12/06/2022   12:03 PM 11/15/2021    1:36 PM 06/30/2020    9:09 AM  CMP  Glucose 70 - 99 mg/dL 440  102  75   BUN 6 - 23 mg/dL 12  14  8    Creatinine  0.40 - 1.20 mg/dL 7.25  3.66  4.40   Sodium 135 - 145 mEq/L 138  137  140   Potassium 3.5 - 5.1 mEq/L 3.9  3.8  3.6   Chloride 96 - 112 mEq/L 104  104  104   CO2 19 - 32 mEq/L 28  27  28    Calcium 8.4 - 10.5 mg/dL 9.7  9.2  9.8   Total Protein 6.0 - 8.3 g/dL 8.0   8.2   Total Bilirubin 0.2 - 1.2 mg/dL 0.4   0.4   Alkaline Phos 39 - 117 U/L 95   76   AST 0 - 37 U/L 18   11   ALT 0 - 35 U/L 25   12     Current Medications, Allergies, Past Medical History, Past Surgical History, Family History and Social History were reviewed in [employer] record.   Review of Systems:   Constitutional: Negative for fever, sweats, chills or weight loss.  Respiratory: Negative for shortness of breath.   Cardiovascular: Negative for chest pain, palpitations and leg swelling.  Gastrointestinal: See HPI.  Musculoskeletal: Negative for back pain or muscle aches.  Neurological: Negative for dizziness, headaches or paresthesias.    Physical Exam: There were no vitals taken for this visit. General: in no acute distress. Head: Normocephalic and atraumatic. Eyes: No scleral icterus. Conjunctiva pink . Ears: Normal auditory  acuity. Mouth: Dentition intact. No ulcers or lesions.  Lungs: Clear throughout to auscultation. Heart: Regular rate and rhythm, no murmur. Abdomen: Soft, nontender and nondistended. No masses or hepatomegaly. Normal bowel sounds x 4 quadrants.  Rectal: Deferred.  Musculoskeletal: Symmetrical with no gross deformities. Extremities: No edema. Neurological: Alert oriented x 4. No focal deficits.  Psychological: Alert and cooperative. Normal mood and affect  Assessment and Recommendations:   Intermittent Reflux: Incomplete relief on Nexium QD.   Gallbladder polyps per abdominal sono 2020   Constipation: She does not feel this is related to abdominal pain. Although I wonder if her abdominal pain would improve with improved constipation.    Tubular adenoma of  colon 2 tubular adenomas removed on colonoscopy 05/21/2019.  Surveillance colonoscopy recommended in 2028.

## 2023-02-20 ENCOUNTER — Ambulatory Visit (INDEPENDENT_AMBULATORY_CARE_PROVIDER_SITE_OTHER): Payer: 59 | Admitting: Nurse Practitioner

## 2023-02-20 ENCOUNTER — Encounter: Payer: Self-pay | Admitting: Nurse Practitioner

## 2023-02-20 VITALS — BP 102/60 | HR 75 | Ht 61.0 in | Wt 162.0 lb

## 2023-02-20 DIAGNOSIS — R110 Nausea: Secondary | ICD-10-CM | POA: Diagnosis not present

## 2023-02-20 DIAGNOSIS — G8929 Other chronic pain: Secondary | ICD-10-CM | POA: Diagnosis not present

## 2023-02-20 DIAGNOSIS — R1030 Lower abdominal pain, unspecified: Secondary | ICD-10-CM

## 2023-02-20 DIAGNOSIS — Z860101 Personal history of adenomatous and serrated colon polyps: Secondary | ICD-10-CM

## 2023-02-20 DIAGNOSIS — R1013 Epigastric pain: Secondary | ICD-10-CM

## 2023-02-20 NOTE — Patient Instructions (Addendum)
You have been scheduled for an endoscopy. Please follow written instructions given to you at your visit today.  If you use inhalers (even only as needed), please bring them with you on the day of your procedure. ___________________________________________________________________________  IbGuard (peppermint oil) - take 1 by mouth twice daily for abdominal pain  Due to recent changes in healthcare laws, you may see the results of your imaging and laboratory studies on MyChart before your provider has had a chance to review them.  We understand that in some cases there may be results that are confusing or concerning to you. Not all laboratory results come back in the same time frame and the provider may be waiting for multiple results in order to interpret others.  Please give [place] 48 hours in order for your provider to thoroughly review all the results before contacting the office for clarification of your results.   Thank you for trusting me with your gastrointestinal care!   Alcide Evener, CRNP

## 2023-02-21 NOTE — Progress Notes (Signed)
Attending Physician's Attestation   I have reviewed the chart.   I agree with the Advanced Practitioner's note, impression, and recommendations with any updates as below. I think a repeat upper endoscopy is reasonable with her NSAID use and the length of time since that endoscopy.  With that being said, functional abdominal pain seems most likely.  We can move forward with an upper endoscopy being scheduled.  A referral for a second opinion can also be placed.  Otherwise for now reasonable to initiate a TCA if she is willing.   Corliss Parish, MD [facility] Advanced Endoscopy Office # [phone]

## 2023-02-25 NOTE — Progress Notes (Signed)
Monica Steele, pls contact patient (will require vietnamese interpretor) and let her know Dr. Meridee Score agreed with repeating an EGD, proceed as scheduled. If patient is willing to try Amitriptyline 10mg  one tab po at bedtime for her chronic abd pain, pls send in RX for # 30, no refills.

## 2023-02-26 ENCOUNTER — Other Ambulatory Visit: Payer: Self-pay

## 2023-02-26 ENCOUNTER — Telehealth: Payer: Self-pay

## 2023-02-26 DIAGNOSIS — R1013 Epigastric pain: Secondary | ICD-10-CM

## 2023-02-26 DIAGNOSIS — G8929 Other chronic pain: Secondary | ICD-10-CM

## 2023-02-26 MED ORDER — AMITRIPTYLINE HCL 25 MG PO TABS
ORAL_TABLET | ORAL | 0 refills | Status: DC
Start: 2023-02-26 — End: 2023-02-26

## 2023-02-26 MED ORDER — AMITRIPTYLINE HCL 10 MG PO TABS: ORAL_TABLET | ORAL | 0 refills | Status: DC

## 2023-02-26 NOTE — Telephone Encounter (Signed)
Patient returned call

## 2023-02-26 NOTE — Telephone Encounter (Signed)
Though an [place] interpreter pt was left a message to call back.

## 2023-02-26 NOTE — Telephone Encounter (Signed)
Though a vietnamese interpreter pt was made aware of Dr. Meridee Score and Alcide Evener NP recommendations. Prescription was sent to pharmacy. Pt made aware.  Pt verbalized understanding with all questions answered.

## 2023-02-26 NOTE — Telephone Encounter (Signed)
Message Received: [name], [name], NP  Emeline Darling, RN      Previous Messages  Routed Note  Author: Arnaldo Natal, NP Service: Gastroenterology Author Type: Nurse Practitioner  Filed: 02/25/2023  4:20 PM Encounter Date: 02/20/2023 Status: Signed  Editor: Arnaldo Natal, NP (Nurse Practitioner)  Monica Steele, pls contact patient (will require vietnamese interpretor) and let her know Dr. Meridee Score agreed with repeating an EGD, proceed as scheduled. If patient is willing to try Amitriptyline 10mg  one tab po at bedtime for her chronic abd pain, pls send in RX for # 30, no refills.      Office Visit for Abdominal Pain 02/20/2023 Arnaldo Natal, NP - [facility] Gastroenterology Diagnoses  Abdominal Pain, Epigastric (Primary) Lower abdominal pain Orders Signed This Visit  (1) Ambulatory referral to Gastroenterology Orders Pended This Visit  None Progress Notes  Arnaldo Natal, NP at 02/20/2023 10:00 AM  Status: Signed  Monica Steele, pls contact patient (will require vietnamese interpretor) and let her know Dr. Meridee Score agreed with repeating an EGD, proceed as scheduled. If patient is willing to try Amitriptyline 10mg  one tab po at bedtime for her chronic abd pain, pls send in RX for # 30, no refills.     Mansouraty, Netty Starring., MD at 02/20/2023 10:00 AM  Status: Signed   Attending Physician's Attestation    I have reviewed the chart.    I agree with the Advanced Practitioner's note, impression, and recommendations with any updates as below. I think a repeat upper endoscopy is reasonable with her NSAID use and the length of time since that endoscopy.  With that being said, functional abdominal pain seems most likely.  We can move forward with an upper endoscopy being scheduled.  A referral for a second opinion can also be placed.  Otherwise for now reasonable to initiate a TCA if she is willing.     Corliss Parish,  MD [facility] Advanced Endoscopy Office # [phone]

## 2023-02-26 NOTE — Addendum Note (Signed)
Addended by: Emeline Darling on: 02/26/2023 03:46 PM   Modules accepted: Orders

## 2023-03-08 ENCOUNTER — Ambulatory Visit: Payer: 59 | Admitting: Family Medicine

## 2023-03-20 ENCOUNTER — Other Ambulatory Visit: Payer: Self-pay | Admitting: Nurse Practitioner

## 2023-03-20 DIAGNOSIS — G8929 Other chronic pain: Secondary | ICD-10-CM

## 2023-03-28 ENCOUNTER — Encounter: Payer: Self-pay | Admitting: Family Medicine

## 2023-03-28 ENCOUNTER — Ambulatory Visit (INDEPENDENT_AMBULATORY_CARE_PROVIDER_SITE_OTHER): Payer: 59 | Admitting: Family Medicine

## 2023-03-28 VITALS — BP 114/76 | HR 80 | Temp 97.8°F | Ht 61.0 in | Wt 164.0 lb

## 2023-03-28 DIAGNOSIS — Z758 Other problems related to medical facilities and other health care: Secondary | ICD-10-CM | POA: Diagnosis not present

## 2023-03-28 DIAGNOSIS — R1033 Periumbilical pain: Secondary | ICD-10-CM | POA: Diagnosis not present

## 2023-03-28 DIAGNOSIS — K219 Gastro-esophageal reflux disease without esophagitis: Secondary | ICD-10-CM

## 2023-03-28 DIAGNOSIS — Z603 Acculturation difficulty: Secondary | ICD-10-CM | POA: Diagnosis not present

## 2023-03-28 DIAGNOSIS — J3489 Other specified disorders of nose and nasal sinuses: Secondary | ICD-10-CM | POA: Diagnosis not present

## 2023-03-28 DIAGNOSIS — J302 Other seasonal allergic rhinitis: Secondary | ICD-10-CM

## 2023-03-28 NOTE — Patient Instructions (Signed)
 Try saline nasal spray and Flonase nasal spray (both over the counter).   Follow up with GI and Dr. Pete Glatter, Urologist.

## 2023-03-28 NOTE — Progress Notes (Signed)
 Subjective:     Patient ID: Monica Steele, female    DOB: 16-Sep-1985, 38 y.o.   MRN: [id number]  Chief Complaint  Patient presents with   Medical Management of Chronic Issues    3 month f/u    HPI  History of Present Illness          Here for follow up on abdominal pain. Pain persists. Unchanged.   She saw GI and urology.   States Amitriptyline  did help her pain.   She is having an EGD   Last colonoscopy in 05/2019 and is due for recall in 2028.   She last saw OB/GYN in May 2024  C/o left sided nasal pain. TTP  No injury. No bleeding.   No weight loss or worsening.    Health Maintenance Due  Topic Date Due   HPV VACCINES (2 - 3-dose SCDM series) 09/09/2020    Past Medical History:  Diagnosis Date   Allergy    Dry eyes    Gallbladder polyp    GERD (gastroesophageal reflux disease)    H/O seasonal allergies    Palpitations     Past Surgical History:  Procedure Laterality Date   CHOLECYSTECTOMY N/A 07/31/2022   Procedure: LAPAROSCOPIC CHOLECYSTECTOMY;  Surgeon: Paola Dreama SAILOR, MD;  Location: [facility];  Service: General;  Laterality: N/A;   COLONOSCOPY  2021   ESOPHAGOGASTRODUODENOSCOPY ENDOSCOPY  2021    Family History  Problem Relation Age of Onset   Healthy Mother    Healthy Father    Colon cancer Neg Hx    Esophageal cancer Neg Hx    Prostate cancer Neg Hx    Rectal cancer Neg Hx     Social History   Socioeconomic History   Marital status: Married    Spouse name: Not on file   Number of children: 2   Years of education: Not on file   Highest education level: 6th grade  Occupational History   Occupation: Nail Tech  Tobacco Use   Smoking status: Never   Smokeless tobacco: Never  Vaping Use   Vaping status: Never Used  Substance and Sexual Activity   Alcohol use: No   Drug use: No   Sexual activity: Yes    Birth control/protection: None  Other Topics Concern   Not on file  Social History Narrative   Lives home with  husband, Monica Steele, and two children.  1 son and 1 daughter. 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  Education 8th grade.  Caffeine one [employer] daily.   Social Drivers of Corporate Investment Banker Strain: Low Risk  (03/24/2023)   Overall Financial Resource Strain (CARDIA)    Difficulty of Paying Living Expenses: Not very hard  Food Insecurity: Food Insecurity Present (03/24/2023)   Hunger Vital Sign    Worried About Running Out of Food in the Last Year: Often true    Ran Out of Food in the Last Year: Never true  Transportation Needs: No Transportation Needs (03/24/2023)   PRAPARE - Administrator, Civil Service (Medical): No    Lack of Transportation (Non-Medical): No  Physical Activity: Unknown (08/06/2022)   Exercise Vital Sign    Days of Exercise per Week: 0 days    Minutes of Exercise per Session: Not on file  Stress: No Stress Concern Present (08/06/2022)   [employer] of Occupational Health - Occupational Stress Questionnaire    Feeling of Stress : Not at all  Social Connections: Socially Isolated (03/24/2023)   Social Connection and Isolation Panel [NHANES]    Frequency of Communication with Friends and Family: Once a week    Frequency of Social Gatherings with Friends and Family: Once a week    Attends Religious Services: Never    Database Administrator or Organizations: No    Attends Engineer, Structural: Not on file    Marital Status: Married  Catering Manager Violence: Not on file    Outpatient Medications Prior to Visit  Medication Sig Dispense Refill   acetaminophen  (TYLENOL ) 500 MG tablet Take 2 tablets (1,000 mg total) by mouth 4 (four) times daily. 120 tablet 3   amitriptyline  (ELAVIL ) 10 MG tablet TAKE 1 TABLET BY MOUTH EVERYDAY AT BEDTIME 90 tablet 0   docusate sodium  (COLACE) 100 MG capsule Take 1 capsule (100 mg total) by mouth 2 (two) times daily. 60 capsule 2   meloxicam  (MOBIC ) 15 MG tablet TAKE 1 TABLET (15 MG TOTAL) BY MOUTH DAILY. 30  tablet 0   pantoprazole  (PROTONIX ) 40 MG tablet Take 40 mg by mouth daily.     Vibegron  (GEMTESA ) 75 MG TABS Take 1 tablet (75 mg total) by mouth daily. 30 tablet 11   No facility-administered medications prior to visit.    Allergies  Allergen Reactions   Gadolinium Derivatives Nausea And Vomiting    Pt vomited after multihance  injection.     Review of Systems  Constitutional:  Negative for chills and fever.  HENT:  Negative for congestion, ear pain, sinus pain and sore throat.        Left nasal tenderness   Respiratory:  Negative for cough and shortness of breath.   Cardiovascular:  Negative for chest pain, palpitations and leg swelling.  Gastrointestinal:  Positive for abdominal pain. Negative for constipation, diarrhea, nausea and vomiting.  Genitourinary:  Negative for dysuria, frequency and urgency.  Neurological:  Negative for dizziness and focal weakness.       Objective:    Physical Exam Constitutional:      General: She is not in acute distress.    Appearance: She is not ill-appearing.  HENT:     Nose: No congestion.     Comments: TTP of left nare without obvious deformity or infection  Eyes:     Extraocular Movements: Extraocular movements intact.     Conjunctiva/sclera: Conjunctivae normal.  Cardiovascular:     Rate and Rhythm: Normal rate.  Pulmonary:     Effort: Pulmonary effort is normal.  Musculoskeletal:     Cervical back: Normal range of motion and neck supple.  Skin:    General: Skin is warm and dry.  Neurological:     General: No focal deficit present.     Mental Status: She is alert and oriented to person, place, and time.     Motor: No weakness.     Coordination: Coordination normal.     Gait: Gait normal.  Psychiatric:        Mood and Affect: Mood normal.        Behavior: Behavior normal.        Thought Content: Thought content normal.      BP 114/76 (BP Location: Left Arm, Patient Position: Sitting, Cuff Size: Large)   Pulse 80    Temp 97.8 F (36.6 C) (Temporal)   Ht 5' 1 (1.549 m)   Wt 164 lb (74.4 kg)   SpO2 98%   BMI 30.99 kg/m  Wt Readings from Last 3 Encounters:  03/28/23 164 lb (74.4 kg)  02/20/23 162 lb (73.5 kg)  02/13/23 165 lb (74.8 kg)       Assessment & Plan:   Problem List Items Addressed This Visit     Language barrier   Periumbilical abdominal pain - Primary   Seasonal allergies   Other Visit Diagnoses       Gastroesophageal reflux disease, unspecified whether esophagitis present         Nasal pain          Medical interpreter on stick used for visit.  Pain is unchanged. She is now under the care of GI and urology. Reviewed notes from both specialists. Upcoming EGD.  Continue current medications.  Recommend saline nasal spray and Flonase  if needed for nasal pain.  Follow up as if worsening or when due for CPE  I am having Monica Steele maintain her acetaminophen , docusate sodium , meloxicam , pantoprazole , Gemtesa , and amitriptyline .  No orders of the defined types were placed in this encounter.

## 2023-04-01 ENCOUNTER — Encounter: Payer: Self-pay | Admitting: *Deleted

## 2023-04-01 ENCOUNTER — Ambulatory Visit
Admission: EM | Admit: 2023-04-01 | Discharge: 2023-04-01 | Disposition: A | Discharge status: Home / Self Care | Payer: 59 | Attending: Physician Assistant | Admitting: Physician Assistant

## 2023-04-01 DIAGNOSIS — R519 Headache, unspecified: Secondary | ICD-10-CM | POA: Diagnosis not present

## 2023-04-01 MED ORDER — RIZATRIPTAN BENZOATE 10 MG PO TABS: 10.0000 mg | ORAL_TABLET | ORAL | 0 refills | Status: DC | PRN

## 2023-04-01 MED ORDER — KETOROLAC TROMETHAMINE 30 MG/ML IJ SOLN
30.0000 mg | Freq: Once | INTRAMUSCULAR | Status: AC
  Administered 2023-04-01: 30 mg via INTRAMUSCULAR

## 2023-04-01 NOTE — ED Provider Notes (Signed)
 Monica Steele    CSN: [id number] Arrival date & time: 04/01/23  1022      History   Chief Complaint Chief Complaint  Patient presents with   Headache    HPI Monica Steele [name] is a 38 y.o. female.   Patient presents with a headache that started about 3 days ago.  She has a history of migraines and has taken Maxalt  in the past with some relief.  She has tried Tylenol  with no relief at this time.  Denies photophobia, visual changes, nausea, vomiting.  Denies congestion, cough, body aches or other viral illness symptoms.    Past Medical History:  Diagnosis Date   Allergy    Dry eyes    Gallbladder polyp    GERD (gastroesophageal reflux disease)    H/O seasonal allergies    Palpitations     Patient Active Problem List   Diagnosis Date Noted   Mixed incontinence 01/16/2023   Bladder wall thickening 12/26/2022   Periumbilical abdominal pain 12/26/2022   Chest pain of uncertain etiology 12/06/2022   Palpitations 12/06/2022   Migraine 12/06/2022   Pain of left breast 08/07/2022   Abdominal pain, epigastric 09/27/2021   History of gestational diabetes mellitus 07/06/2021   PROM (premature rupture of membranes) 06/04/2021   Normal postpartum course 06/03/2021   Gestational diabetes 06/02/2021   Language barrier 06/02/2021   SVD (06/02/21) 06/02/2021   Gastroesophageal reflux disease without esophagitis 07/12/2020   Dysphagia 07/15/2019   Seasonal allergies 07/15/2019   Numbness of face 07/15/2019   Polycystic ovary syndrome 01/16/2019   Abnormal auditory perception of right ear 06/11/2017   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 Authorizing Provider  rizatriptan  (MAXALT ) 10 MG tablet Take 1 tablet (10 mg total) by mouth as needed for migraine. May repeat in 2 hours if needed 04/01/23  Yes Ward, Harlene PEDLAR, PA-C  acetaminophen  (TYLENOL ) 500 MG tablet Take 2 tablets (1,000 mg total) by mouth 4 (four) times daily. 07/31/22 07/31/23  Paola Dreama SAILOR, MD  amitriptyline  (ELAVIL ) 10 MG tablet TAKE 1 TABLET BY MOUTH EVERYDAY AT BEDTIME 03/20/23   Kennedy-Smith, Colleen M, NP  docusate sodium  (COLACE) 100 MG capsule Take 1 capsule (100 mg total) by mouth 2 (two) times daily. 07/31/22 07/31/23  Paola Dreama SAILOR, MD  meloxicam  (MOBIC ) 15 MG tablet TAKE 1 TABLET (15 MG TOTAL) BY MOUTH DAILY. 11/13/22   [name], [name], NP-C  pantoprazole  (PROTONIX ) 40 MG tablet Take 40 mg by mouth daily.    [provider]  Vibegron  (GEMTESA ) 75 MG TABS Take 1 tablet (75 mg total) by mouth daily. 02/13/23   Stoneking, Adine PARAS., MD  Family History Family History  Problem Relation Age of Onset   Healthy Mother    Healthy Father    Colon cancer Neg Hx    Esophageal cancer Neg Hx    Prostate cancer Neg Hx    Rectal cancer Neg Hx     Social History Social History   Tobacco Use   Smoking status: Never   Smokeless tobacco: Never  Vaping Use   Vaping status: Never Used  Substance Use Topics   Alcohol use: No   Drug use: No     Allergies   Gadolinium derivatives   Review of Systems Review of Systems  Constitutional:  Negative for chills and fever.  HENT:  Negative for ear pain and sore throat.   Eyes:  Negative for pain and visual disturbance.  Respiratory:  Negative for cough and shortness of breath.   Cardiovascular:  Negative for chest pain and palpitations.   Gastrointestinal:  Negative for abdominal pain and vomiting.  Genitourinary:  Negative for dysuria and hematuria.  Musculoskeletal:  Negative for arthralgias and back pain.  Skin:  Negative for color change and rash.  Neurological:  Positive for headaches. Negative for seizures and syncope.  All other systems reviewed and are negative.    Physical Exam Triage Vital Signs ED Triage Vitals  Encounter Vitals Group     BP 04/01/23 1038 114/76     Systolic BP Percentile --      Diastolic BP Percentile --      Pulse Rate 04/01/23 1038 93     Resp 04/01/23 1038 16     Temp 04/01/23 1038 98.5 F (36.9 C)     Temp Source 04/01/23 1038 Oral     SpO2 04/01/23 1038 96 %     Weight --      Height --      Head Circumference --      Peak Flow --      Pain Score 04/01/23 1034 7     Pain Loc --      Pain Education --      Exclude from Growth Chart --    No data found.  Updated Vital Signs BP 114/76 (BP Location: Left Arm)   Pulse 93   Temp 98.5 F (36.9 C) (Oral)   Resp 16   LMP 03/22/2023   SpO2 96%   Breastfeeding No   Visual Acuity Right Eye Distance:   Left Eye Distance:   Bilateral Distance:    Right Eye Near:   Left Eye Near:    Bilateral Near:     Physical Exam Vitals and nursing note reviewed.  Constitutional:      General: She is not in acute distress.    Appearance: She is well-developed.  HENT:     Head: Normocephalic and atraumatic.  Eyes:     Conjunctiva/sclera: Conjunctivae normal.  Cardiovascular:     Rate and Rhythm: Normal rate and regular rhythm.     Heart sounds: No murmur heard. Pulmonary:     Effort: Pulmonary effort is normal. No respiratory distress.     Breath sounds: Normal breath sounds.  Abdominal:     Palpations: Abdomen is soft.     Tenderness: There is no abdominal tenderness.  Musculoskeletal:        General: No swelling.     Cervical back: Neck supple.  Skin:    General: Skin is warm and dry.     Capillary Refill: Capillary  refill takes less than 2 seconds.  Neurological:     Mental Status: She is alert.  Psychiatric:        Mood and Affect: Mood normal.      UC Treatments / Results  Labs (all labs ordered are listed, but only abnormal results are displayed) Labs Reviewed - No data to display  EKG   Radiology No results found.  Procedures Procedures (including critical Steele time)  Medications Ordered in UC Medications  ketorolac  (TORADOL ) 30 MG/ML injection 30 mg (30 mg Intramuscular Given 04/01/23 1107)    Initial Impression / Assessment and Plan / UC Course  I have reviewed the triage vital signs and the nursing notes.  Pertinent labs & imaging results that were available during my Steele of the patient were reviewed by me and considered in my medical decision making (see chart for details).     Headache.  Appears to have a history of migraines.  Will send in refill of Maxalt  to take as needed.  Toradol  given in clinic today.  Neuroexam normal.  No signs of viral illness. Final Clinical Impressions(s) / UC Diagnoses   Final diagnoses:  Acute nonintractable headache, unspecified headache type     Discharge Instructions      Can take Maxalt  as needed for migraine headache Drink plenty of fluids, rest Follow up with Primary Steele Physician if headaches become more frequent     ED Prescriptions     Medication Sig Dispense Auth. Provider   rizatriptan  (MAXALT ) 10 MG tablet Take 1 tablet (10 mg total) by mouth as needed for migraine. May repeat in 2 hours if needed 10 tablet Ward, Zacharius Funari Z, PA-C      PDMP not reviewed this encounter.   Ward, Harlene PEDLAR, PA-C 04/01/23 1119

## 2023-04-01 NOTE — Discharge Instructions (Addendum)
 Can take Maxalt as needed for migraine headache Drink plenty of fluids, rest Follow up with Primary Care Physician if headaches become more frequent

## 2023-04-01 NOTE — ED Triage Notes (Signed)
 Video interpreter Darrel Hoover [phone] Headache x 3 days- taking tylenol without relief. Denies nausea

## 2023-04-03 ENCOUNTER — Ambulatory Visit: Payer: 59 | Admitting: Family Medicine

## 2023-04-03 ENCOUNTER — Encounter: Payer: Self-pay | Admitting: Gastroenterology

## 2023-04-09 NOTE — Progress Notes (Deleted)
    Monica Steele D.[facility] [address] [place] [address] Phone: [phone]   Assessment and Plan:     There are no diagnoses linked to this encounter.  ***   Pertinent previous records reviewed include ***    Follow Up: ***     Subjective:   Monica Steele, Monica Steele, am serving as a Neurosurgeon for Doctor Richardean Sale   Chief Complaint: right hip pain    HPI:    09/05/2022 Patient is a 38 year old female complaining of right hip pain. Patient states right hip pain for 3 months, no MOI, radiating down to the right butt cheek, to the leg and has pain when walking,no meds for the pain,     12/06/2022 Patient states her pain has come back the last couple of days. No MOI   04/10/2023 Patient states   Relevant Historical Information: GER  Additional pertinent review of systems negative.   Current Outpatient Medications:    acetaminophen (TYLENOL) 500 MG tablet, Take 2 tablets (1,000 mg total) by mouth 4 (four) times daily., Disp: 120 tablet, Rfl: 3   amitriptyline (ELAVIL) 10 MG tablet, TAKE 1 TABLET BY MOUTH EVERYDAY AT BEDTIME, Disp: 90 tablet, Rfl: 0   docusate sodium (COLACE) 100 MG capsule, Take 1 capsule (100 mg total) by mouth 2 (two) times daily., Disp: 60 capsule, Rfl: 2   meloxicam (MOBIC) 15 MG tablet, TAKE 1 TABLET (15 MG TOTAL) BY MOUTH DAILY., Disp: 30 tablet, Rfl: 0   pantoprazole (PROTONIX) 40 MG tablet, Take 40 mg by mouth daily., Disp: , Rfl:    rizatriptan (MAXALT) 10 MG tablet, Take 1 tablet (10 mg total) by mouth as needed for migraine. May repeat in 2 hours if needed, Disp: 10 tablet, Rfl: 0   Vibegron (GEMTESA) 75 MG TABS, Take 1 tablet (75 mg total) by mouth daily., Disp: 30 tablet, Rfl: 11   Objective:     There were no vitals filed for this visit.    There is no height or weight on file to calculate BMI.    Physical Exam:    ***   Electronically signed by:  Monica Steele D.[facility] 7:36 AM 04/09/23

## 2023-04-09 NOTE — Progress Notes (Unsigned)
Gastroenterology History and Physical   Primary Care Physician:  Avanell Shackleton, NP-C   Reason for Procedure:   Upper abdominal pain  Plan:    EGD     HPI: Monica Steele is a 38 y.o. female w/chronic upper abdomanal pain - last EGD 2021 that looked NL - bxs showed some gastritis but esophagus and duodenal bxs normal  Seen in clinic last month and repeat EGd arranged to evaluate. Amitriptyline was started 10 mg qhs   Past Medical History:  Diagnosis Date   Allergy    Dry eyes    Gallbladder polyp    GERD (gastroesophageal reflux disease)    H/O seasonal allergies    Palpitations     Past Surgical History:  Procedure Laterality Date   CHOLECYSTECTOMY N/A 07/31/2022   Procedure: LAPAROSCOPIC CHOLECYSTECTOMY;  Surgeon: Diamantina Monks, MD;  Location: [facility];  Service: General;  Laterality: N/A;   COLONOSCOPY  2021   ESOPHAGOGASTRODUODENOSCOPY ENDOSCOPY  2021    Prior to Admission medications   Medication Sig Start Date End Date Taking? Authorizing Provider  acetaminophen (TYLENOL) 500 MG tablet Take 2 tablets (1,000 mg total) by mouth 4 (four) times daily. 07/31/22 07/31/23  Diamantina Monks, MD  amitriptyline (ELAVIL) 10 MG tablet TAKE 1 TABLET BY MOUTH EVERYDAY AT BEDTIME 03/20/23   Arnaldo Natal, NP  docusate sodium (COLACE) 100 MG capsule Take 1 capsule (100 mg total) by mouth 2 (two) times daily. 07/31/22 07/31/23  Diamantina Monks, MD  meloxicam (MOBIC) 15 MG tablet TAKE 1 TABLET (15 MG TOTAL) BY MOUTH DAILY. 11/13/22   [name], [name], NP-C  pantoprazole (PROTONIX) 40 MG tablet Take 40 mg by mouth daily.    [provider]  rizatriptan (MAXALT) 10 MG tablet Take 1 tablet (10 mg total) by mouth as needed for migraine. May repeat in 2 hours if needed 04/01/23   Ward, Tylene Fantasia, PA-C  Vibegron (GEMTESA) 75 MG TABS Take 1 tablet (75 mg total) by mouth daily. 02/13/23   Stoneking, Danford Bad., MD    Current Outpatient Medications   Medication Sig Dispense Refill   acetaminophen (TYLENOL) 500 MG tablet Take 2 tablets (1,000 mg total) by mouth 4 (four) times daily. 120 tablet 3   amitriptyline (ELAVIL) 10 MG tablet TAKE 1 TABLET BY MOUTH EVERYDAY AT BEDTIME 90 tablet 0   docusate sodium (COLACE) 100 MG capsule Take 1 capsule (100 mg total) by mouth 2 (two) times daily. 60 capsule 2   meloxicam (MOBIC) 15 MG tablet TAKE 1 TABLET (15 MG TOTAL) BY MOUTH DAILY. 30 tablet 0   pantoprazole (PROTONIX) 40 MG tablet Take 40 mg by mouth daily.     rizatriptan (MAXALT) 10 MG tablet Take 1 tablet (10 mg total) by mouth as needed for migraine. May repeat in 2 hours if needed 10 tablet 0   Vibegron (GEMTESA) 75 MG TABS Take 1 tablet (75 mg total) by mouth daily. 30 tablet 11   No current facility-administered medications for this visit.    Allergies as of 04/10/2023 - Review Complete 04/01/2023  Allergen Reaction Noted   Gadolinium derivatives Nausea And Vomiting 08/27/2017    Family History  Problem Relation Age of Onset   Healthy Mother    Healthy Father    Colon cancer Neg Hx    Esophageal cancer Neg Hx    Prostate cancer Neg Hx    Rectal cancer Neg Hx     Social History  Socioeconomic History   Marital status: Married    Spouse name: Not on file   Number of children: 2   Years of education: Not on file   Highest education level: 6th grade  Occupational History   Occupation: Nail Tech  Tobacco Use   Smoking status: Never   Smokeless tobacco: Never  Vaping Use   Vaping status: Never Used  Substance and Sexual Activity   Alcohol use: No   Drug use: No   Sexual activity: Yes    Birth control/protection: None  Other Topics Concern   Not on file  Social History Narrative   Lives home with husband, Monica Steele, and two children.  1 son and 1 daughter. 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  Education 8th grade.  Caffeine one [employer] daily.   Social Drivers of Corporate investment banker Strain: Low Risk   (03/24/2023)   Overall Financial Resource Strain (CARDIA)    Difficulty of Paying Living Expenses: Not very hard  Food Insecurity: Food Insecurity Present (03/24/2023)   Hunger Vital Sign    Worried About Running Out of Food in the Last Year: Often true    Ran Out of Food in the Last Year: Never true  Transportation Needs: No Transportation Needs (03/24/2023)   PRAPARE - Administrator, Civil Service (Medical): No    Lack of Transportation (Non-Medical): No  Physical Activity: Unknown (08/06/2022)   Exercise Vital Sign    Days of Exercise per Week: 0 days    Minutes of Exercise per Session: Not on file  Stress: No Stress Concern Present (08/06/2022)   [employer] of Occupational Health - Occupational Stress Questionnaire    Feeling of Stress : Not at all  Social Connections: Socially Isolated (03/24/2023)   Social Connection and Isolation Panel [NHANES]    Frequency of Communication with Friends and Family: Once a week    Frequency of Social Gatherings with Friends and Family: Once a week    Attends Religious Services: Never    Database administrator or Organizations: No    Attends Engineer, structural: Not on file    Marital Status: Married  Catering manager Violence: Not on file    Review of Systems: Positive for *** All other review of systems negative except as mentioned in the HPI.  Physical Exam: Vital signs LMP 03/22/2023   General:   Alert,  Well-developed, well-nourished, pleasant and cooperative in NAD Lungs:  Clear throughout to auscultation.   Heart:  Regular rate and rhythm; no murmurs, clicks, rubs,  or gallops. Abdomen:  Soft, nontender and nondistended. Normal bowel sounds.   Neuro/Psych:  Alert and cooperative. Normal mood and affect. A and O x 3   @Aunna Snooks  Sena Slate, MD, [facility] [phone] (pager) 04/09/2023 6:23 PM@

## 2023-04-10 ENCOUNTER — Encounter: Payer: 59 | Admitting: Gastroenterology

## 2023-04-10 ENCOUNTER — Ambulatory Visit (AMBULATORY_SURGERY_CENTER): Payer: 59 | Admitting: Internal Medicine

## 2023-04-10 ENCOUNTER — Ambulatory Visit: Payer: 59 | Admitting: Sports Medicine

## 2023-04-10 ENCOUNTER — Encounter: Payer: Self-pay | Admitting: Internal Medicine

## 2023-04-10 ENCOUNTER — Ambulatory Visit: Payer: 59 | Admitting: Family Medicine

## 2023-04-10 VITALS — BP 114/72 | HR 77 | Resp 14

## 2023-04-10 DIAGNOSIS — F419 Anxiety disorder, unspecified: Secondary | ICD-10-CM | POA: Diagnosis not present

## 2023-04-10 DIAGNOSIS — G8929 Other chronic pain: Secondary | ICD-10-CM

## 2023-04-10 DIAGNOSIS — R1084 Generalized abdominal pain: Secondary | ICD-10-CM | POA: Diagnosis not present

## 2023-04-10 DIAGNOSIS — R1013 Epigastric pain: Secondary | ICD-10-CM | POA: Diagnosis not present

## 2023-04-10 MED ORDER — SODIUM CHLORIDE 0.9 % IV SOLN
500.0000 mL | INTRAVENOUS | Status: DC
Start: 2023-04-10 — End: 2023-04-10

## 2023-04-10 MED ORDER — AMITRIPTYLINE HCL 10 MG PO TABS: ORAL_TABLET | ORAL | 3 refills | Status: DC

## 2023-04-10 NOTE — Patient Instructions (Addendum)
The esophagus, stomach and duodenum (upper intestine) all look ok.  I have refilled the amitriptyline since it helped.  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 We are not sure why it happens but it is not dangerous.  I appreciate the opportunity to care for you. Iva Boop, MD, FACG   YOU HAD AN ENDOSCOPIC PROCEDURE TODAY AT THE [facility]:   Refer to the procedure report that was given to you for any specific questions about what was found during the examination.  If the procedure report does not answer your questions, please call your gastroenterologist to clarify.  If you requested that your care partner not be given the details of your procedure findings, then the procedure report has been included in a sealed envelope for you to review at your convenience later.  YOU SHOULD EXPECT: Some feelings of bloating in the abdomen. Passage of more gas than usual.  Walking can help get rid of the air that was put into your GI tract during the procedure and reduce the bloating. If you had a lower endoscopy (such as a colonoscopy or flexible sigmoidoscopy) you may notice spotting of blood in your stool or on the toilet paper. If you underwent a bowel prep for your procedure, you may not have a normal bowel movement for a few days.  Please Note:  You might notice some irritation and congestion in your nose or some drainage.  This is from the oxygen used during your procedure.  There is no need for concern and it should clear up in a day or so.  SYMPTOMS TO REPORT IMMEDIATELY:  Following upper endoscopy (EGD)  Vomiting of blood or coffee ground material  New chest pain or pain under the shoulder blades  Painful or persistently difficult swallowing  New shortness of breath  Fever of 100F or higher  Black, tarry-looking stools  For urgent or emergent issues, a gastroenterologist can be reached at  any hour by calling [phone]. Do not use MyChart messaging for urgent concerns.    DIET:  We do recommend a small meal at first, but then you may proceed to your regular diet.  Drink plenty of fluids but you should avoid alcoholic beverages for 24 hours.  ACTIVITY:  You should plan to take it easy for the rest of today and you should NOT DRIVE or use heavy machinery until tomorrow (because of the sedation medicines used during the test).    FOLLOW UP: Our staff will call the number listed on your records the next business day following your procedure.  We will call around 7:15- 8:00 am to check on you and address any questions or concerns that you may have regarding the information given to you following your procedure. If we do not reach you, we will leave a message.     If any biopsies were taken you will be contacted by phone or by letter within the next 1-3 weeks.  Please call us at [phone] if you have not heard about the biopsies in 3 weeks.    SIGNATURES/CONFIDENTIALITY: You and/or your care partner have signed paperwork which will be entered into your electronic medical record.  These signatures attest to the fact that that the information above on your After Visit Summary has been reviewed and is understood.  Full responsibility of the confidentiality of this discharge information lies with you and/or your  care-partner.

## 2023-04-10 NOTE — Progress Notes (Signed)
Sedate, gd SR, tolerated procedure well, VSS, report to RN 

## 2023-04-10 NOTE — Progress Notes (Signed)
Interpreter used today at the [facility] for this pt.  Interpreter's name is-Kelly

## 2023-04-10 NOTE — Op Note (Signed)
[facility] Patient Name: Monica Steele Procedure Date: 04/10/2023 12:03 PM MRN: [id number] Endoscopist: Iva Boop , MD, [id number] Age: 38 Referring MD:  Date of Birth: 08/13/1985 Gender: Female Account #: [id number] Procedure:                Upper GI endoscopy Indications:              Epigastric abdominal pain Medicines:                Monitored Anesthesia Care Procedure:                Pre-Anesthesia Assessment:                           - Prior to the procedure, a History and Physical                            was performed, and patient medications and                            allergies were reviewed. The patient's tolerance of                            previous anesthesia was also reviewed. The risks                            and benefits of the procedure and the sedation                            options and risks were discussed with the patient.                            All questions were answered, and informed consent                            was obtained. Prior Anticoagulants: The patient has                            taken no anticoagulant or antiplatelet agents. ASA                            Grade Assessment: I - A normal, healthy patient.                            After reviewing the risks and benefits, the patient                            was deemed in satisfactory condition to undergo the                            procedure.                           After obtaining informed consent, the endoscope was  passed under direct vision. Throughout the                            procedure, the patient's blood pressure, pulse, and                            oxygen saturations were monitored continuously. The                            Olympus Scope [id number] was introduced through the                            mouth, and advanced to the second part of duodenum.                            The upper GI endoscopy was  accomplished without                            difficulty. The patient tolerated the procedure                            well. Scope In: Scope Out: Findings:                 The esophagus was normal.                           The stomach was normal.                           The examined duodenum was normal.                           The cardia and gastric fundus were normal on                            retroflexion. Complications:            No immediate complications. Estimated Blood Loss:     Estimated blood loss: none. Impression:               - Normal esophagus.                           - Normal stomach.                           - Normal examined duodenum.                           - No specimens collected. she has chronic abdominal                            pain - sounds like it could be non-ulcer dyspepsia Recommendation:           - Patient has a contact number available for  emergencies. The signs and symptoms of potential                            delayed complications were discussed with the                            patient. Return to normal activities tomorrow.                            Written discharge instructions were provided to the                            patient.                           - Resume previous diet.                           - Continue present medications.                           - She reported significan benefit from 10 mg                            amitriptyline qhs                           This was refilled x 1 year                           follow-up GI prn and hoepfully can get                            amitriptyline refills from PCP Iva Boop, MD 04/10/2023 12:30:16 PM This report has been signed electronically.

## 2023-04-11 ENCOUNTER — Telehealth: Payer: Self-pay | Admitting: *Deleted

## 2023-04-11 NOTE — Telephone Encounter (Signed)
  Follow up Call-     04/10/2023   11:02 AM  Call back number  Post procedure Call Back phone  # [phone]  Permission to leave phone message Yes     Patient questions:  Do you have a fever, pain , or abdominal swelling? No. Pain Score  0 *  Have you tolerated food without any problems? Yes.    Have you been able to return to your normal activities? Yes.    Do you have any questions about your discharge instructions: Diet   No. Medications  No. Follow up visit  No.  Do you have questions or concerns about your Care? No.  Actions: * If pain score is 4 or above: No action needed, pain <4.

## 2023-04-12 NOTE — Telephone Encounter (Signed)
Patient called and stated that her stomach is hurt a lot since having the procedure done. Patient is requesting a call back. Please advise.

## 2023-04-22 ENCOUNTER — Other Ambulatory Visit: Payer: Self-pay | Admitting: Physician Assistant

## 2023-04-23 NOTE — Progress Notes (Signed)
 [facility] [address] [place] [address] Phone: [phone]   Assessment and Plan:     1. Left elbow pain 2. 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  If still having pain after 2 weeks, complete 3rd-week of NSAID. May use remaining NSAID as needed once daily for pain control.  Do not to use additional over-the-counter NSAIDs (ibuprofen , naproxen, Advil , Aleve) while taking prescription NSAIDs.  May use Tylenol  [phone] mg 2 to 3 times a day for breakthrough pain. - Recommend sitting in a natural position to decrease strain over elbows, wrists, feet, ankles.  May also use a cushion underneath elbow to decrease trauma to this area   Interpreter was used throughout entirety of office visit  Pertinent previous records reviewed include none  Follow Up: As needed.  Could consider elbow HEP versus physical therapy versus prednisone  course   Subjective:   I, Monica Steele, am serving as a neurosurgeon for Doctor Monica Steele   Chief Complaint: right hip pain    HPI:    09/05/2022 Patient is a 38 year old female complaining of right hip pain. Patient states right hip pain for 3 months, no MOI, radiating down to the right butt cheek, to the leg and has pain when walking,no meds for the pain,     12/06/2022 Patient states her pain has come back the last couple of days. No MOI   04/24/2023 Patient states she is her for left hand. Pain for about 3 weeks. No MOI. Pain started at the elbow and radiated down to her hand and fingers with numbness to 4 and 5. No meds for the pain. Decreased grip strength.  Top of left foot is tingling as well     Relevant Historical Information: GER  Additional pertinent review of systems negative.   Current  Outpatient Medications:    acetaminophen  (TYLENOL ) 500 MG tablet, Take 2 tablets (1,000 mg total) by mouth 4 (four) times daily., Disp: 120 tablet, Rfl: 3   amitriptyline  (ELAVIL ) 10 MG tablet, TAKE 1 TABLET BY MOUTH EVERYDAY AT BEDTIME, Disp: 90 tablet, Rfl: 3   docusate sodium  (COLACE) 100 MG capsule, Take 1 capsule (100 mg total) by mouth 2 (two) times daily., Disp: 60 capsule, Rfl: 2   esomeprazole  (NEXIUM ) 40 MG capsule, TAKE 1 CAPSULE BY MOUTH TWICE A DAY, Disp: 60 capsule, Rfl: 0   meloxicam  (MOBIC ) 15 MG tablet, TAKE 1 TABLET (15 MG TOTAL) BY MOUTH DAILY., Disp: 30 tablet, Rfl: 0   meloxicam  (MOBIC ) 15 MG tablet, Take 1 tablet (15 mg total) by mouth daily., Disp: 30 tablet, Rfl: 0   pantoprazole  (PROTONIX ) 40 MG tablet, Take 40 mg by mouth as needed., Disp: , Rfl:    rizatriptan  (MAXALT ) 10 MG tablet, Take 1 tablet (10 mg total) by mouth as needed for migraine. May repeat in 2 hours if needed, Disp: 10 tablet, Rfl: 0   Vibegron  (GEMTESA ) 75 MG TABS, Take 1 tablet (75 mg total) by mouth daily., Disp: 30 tablet, Rfl: 11   Objective:     Vitals:   04/24/23 1500  BP: 120/84  Pulse: 78  SpO2: 100%  Weight: 153 lb (69.4 kg)  Height: 5' 1 (1.549 m)      Body mass index is 28.91 kg/m.  Physical Exam:    General: Appears well, no acute distress, nontoxic and pleasant Neck: FROM, no pain Neuro: sensation is intact distally with no deficits, strenghth is 5/5 in elbow flexors/extenders/supinator/pronators and wrist flexors/extensors Psych: no evidence of anxiety or depression  Left elbow: No deformity, swelling or muscle wasting Normal Carrying angle ROM:0-140, supination and pronation 90 NTTP over triceps, ticeps tendon, olecronon, lat epicondyle, medial epicondyle, antecubital fossa, biceps tendon, supinator, pronator Positive tinnels over cubital tunnel Mild pain with resisted wrist and middle digit extension No pain with resisted wrist flexion Mild pain with resisted  supination Mild pain with resisted pronation Negative valgus stress Negative varus stress Negative milking maneuver    Electronically signed by:  [facility] 3:40 PM 04/24/23

## 2023-04-24 ENCOUNTER — Ambulatory Visit (INDEPENDENT_AMBULATORY_CARE_PROVIDER_SITE_OTHER): Payer: 59 | Admitting: Sports Medicine

## 2023-04-24 VITALS — BP 120/84 | HR 78 | Ht 61.0 in | Wt 153.0 lb

## 2023-04-24 DIAGNOSIS — R079 Chest pain, unspecified: Secondary | ICD-10-CM | POA: Diagnosis not present

## 2023-04-24 DIAGNOSIS — M25522 Pain in left elbow: Secondary | ICD-10-CM

## 2023-04-24 DIAGNOSIS — G5622 Lesion of ulnar nerve, left upper limb: Secondary | ICD-10-CM | POA: Diagnosis not present

## 2023-04-24 DIAGNOSIS — N946 Dysmenorrhea, unspecified: Secondary | ICD-10-CM | POA: Diagnosis not present

## 2023-04-24 MED ORDER — MELOXICAM 15 MG PO TABS: 15.0000 mg | ORAL_TABLET | Freq: Every day | ORAL | 0 refills | Status: DC

## 2023-04-24 NOTE — Patient Instructions (Signed)
-   Start meloxicam 15 mg daily x2 weeks.  If still having pain after 2 weeks, complete 3rd-week of NSAID. May use remaining NSAID as needed once daily for pain control.  Do not to use additional over-the-counter NSAIDs (ibuprofen, naproxen, Advil, Aleve) while taking prescription NSAIDs.  May use Tylenol [phone] mg 2 to 3 times a day for breakthrough pain. Sit in a natural position at work, get a cushion to rest your arm on at work  As needed follow up , if no improvement 3-4 week follow up

## 2023-05-15 ENCOUNTER — Ambulatory Visit: Payer: 59 | Admitting: Urology

## 2023-05-16 ENCOUNTER — Other Ambulatory Visit: Payer: Self-pay | Admitting: Nurse Practitioner

## 2023-05-16 ENCOUNTER — Ambulatory Visit (INDEPENDENT_AMBULATORY_CARE_PROVIDER_SITE_OTHER): Payer: 59 | Admitting: Urology

## 2023-05-16 ENCOUNTER — Encounter: Payer: Self-pay | Admitting: Urology

## 2023-05-16 VITALS — BP 120/79 | HR 94 | Ht 60.0 in | Wt 164.0 lb

## 2023-05-16 DIAGNOSIS — N3946 Mixed incontinence: Secondary | ICD-10-CM | POA: Diagnosis not present

## 2023-05-16 DIAGNOSIS — R102 Pelvic and perineal pain: Secondary | ICD-10-CM | POA: Diagnosis not present

## 2023-05-16 LAB — URINALYSIS
Bilirubin, UA: NEGATIVE
Glucose, UA: NEGATIVE
Ketones, UA: NEGATIVE
Leukocytes,UA: NEGATIVE
Nitrite, UA: NEGATIVE
Specific Gravity, UA: 1.025 (ref 1.005–1.030)
Urobilinogen, Ur: 0.2 mg/dL (ref 0.2–1.0)
pH, UA: 6 (ref 5.0–7.5)

## 2023-05-16 NOTE — Progress Notes (Signed)
 Assessment: 1. Mixed incontinence    Plan: Continue Gemtesa 75 mg daily.   Continue bladder diet  Continue Kegel exercises  Return to office in 6 months  Chief Complaint:  Chief Complaint  Patient presents with   Urinary Incontinence    History of Present Illness:  Monica Steele is a 38 y.o. female who is seen for further evaluation of mixed urinary incontinence. At her initial visit in 10/24, she reported a recent history of abdominal pain in the periumbilical area.  Her symptoms were fairly constant in nature.  She had associated nausea.  She was evaluated with a CT of the abdomen and pelvis with contrast on 12/11/2022 which showed borderline mild urinary bladder wall thickening. Dipstick urinalysis have been negative.  Urine culture negative x 2.  She reported that the periumbilical pain increased with attempting to hold her urine and had some relief with voiding.  She reported urgency, urge incontinence, and stress incontinence.  She wears a panty liner daily.  She has to double void to completely empty her bladder.  No dysuria or gross hematuria.  No recent UTIs.  No problems with bowel movements.  She was given a trial of Gemtesa 75 mg at her visit in 10/24.  At her visit in November 2024, she reported improvement in her urinary symptoms with Gemtesa.  She was not having any bladder discomfort.  Her urgency and incontinence also were improved.  No side effects from the medication.  No dysuria or gross hematuria.  She was continued on [place].  She returns today for follow-up.  She continues on Gemtesa with good control of her bladder symptoms.  She is not having any bladder discomfort or incontinence.  No dysuria or gross hematuria.  No side effects from the medication.  An interpreter was used throughout the visit today.  Past Medical History:  Past Medical History:  Diagnosis Date   Allergy    Anxiety    Dry eyes    Gallbladder polyp    GERD (gastroesophageal  reflux disease)    H/O seasonal allergies    Palpitations     Past Surgical History:  Past Surgical History:  Procedure Laterality Date   CHOLECYSTECTOMY N/A 07/31/2022   Procedure: LAPAROSCOPIC CHOLECYSTECTOMY;  Surgeon: Diamantina Monks, MD;  Location: [facility];  Service: General;  Laterality: N/A;   COLONOSCOPY  2021   ESOPHAGOGASTRODUODENOSCOPY ENDOSCOPY  2021    Allergies:  Allergies  Allergen Reactions   Gadolinium Derivatives Nausea And Vomiting    Pt vomited after multihance injection.     Family History:  Family History  Problem Relation Age of Onset   Healthy Mother    Healthy Father    Colon cancer Neg Hx    Esophageal cancer Neg Hx    Prostate cancer Neg Hx    Rectal cancer Neg Hx     Social History:  Social History   Tobacco Use   Smoking status: Never   Smokeless tobacco: Never  Vaping Use   Vaping status: Never Used  Substance Use Topics   Alcohol use: No   Drug use: No    ROS: Constitutional:  Negative for fever, chills, weight loss CV: Negative for chest pain, previous MI, hypertension Respiratory:  Negative for shortness of breath, wheezing, sleep apnea, frequent cough GI:  Negative for nausea, vomiting, bloody stool, GERD  Physical exam: BP 120/79   Pulse 94   Ht 5' (1.524 m)   Wt 164 lb (74.4 kg)  BMI 32.03 kg/m  GENERAL APPEARANCE:  Well appearing, well developed, well nourished, NAD HEENT:  Atraumatic, normocephalic, oropharynx clear NECK:  Supple without lymphadenopathy or thyromegaly ABDOMEN:  Soft, non-tender, no masses EXTREMITIES:  Moves all extremities well, without clubbing, cyanosis, or edema NEUROLOGIC:  Alert and oriented x 3, normal gait, CN II-XII grossly intact MENTAL STATUS:  appropriate BACK:  Non-tender to palpation, No CVAT SKIN:  Warm, dry, and intact  Results: U/A: trace blood

## 2023-05-22 ENCOUNTER — Encounter: Payer: Self-pay | Admitting: Family Medicine

## 2023-05-22 ENCOUNTER — Ambulatory Visit (INDEPENDENT_AMBULATORY_CARE_PROVIDER_SITE_OTHER): Payer: 59 | Admitting: Family Medicine

## 2023-05-22 ENCOUNTER — Ambulatory Visit (INDEPENDENT_AMBULATORY_CARE_PROVIDER_SITE_OTHER)

## 2023-05-22 VITALS — BP 118/84 | HR 90 | Temp 97.8°F | Ht 60.0 in | Wt 160.0 lb

## 2023-05-22 DIAGNOSIS — Z758 Other problems related to medical facilities and other health care: Secondary | ICD-10-CM | POA: Diagnosis not present

## 2023-05-22 DIAGNOSIS — R0602 Shortness of breath: Secondary | ICD-10-CM

## 2023-05-22 DIAGNOSIS — R9389 Abnormal findings on diagnostic imaging of other specified body structures: Secondary | ICD-10-CM

## 2023-05-22 DIAGNOSIS — R071 Chest pain on breathing: Secondary | ICD-10-CM

## 2023-05-22 DIAGNOSIS — Z603 Acculturation difficulty: Secondary | ICD-10-CM | POA: Diagnosis not present

## 2023-05-22 DIAGNOSIS — R928 Other abnormal and inconclusive findings on diagnostic imaging of breast: Secondary | ICD-10-CM | POA: Diagnosis not present

## 2023-05-22 DIAGNOSIS — R079 Chest pain, unspecified: Secondary | ICD-10-CM | POA: Diagnosis not present

## 2023-05-22 DIAGNOSIS — R000 Tachycardia, unspecified: Secondary | ICD-10-CM | POA: Diagnosis not present

## 2023-05-22 DIAGNOSIS — R0789 Other chest pain: Secondary | ICD-10-CM | POA: Diagnosis not present

## 2023-05-22 DIAGNOSIS — R051 Acute cough: Secondary | ICD-10-CM | POA: Diagnosis not present

## 2023-05-22 DIAGNOSIS — R059 Cough, unspecified: Secondary | ICD-10-CM | POA: Diagnosis not present

## 2023-05-22 DIAGNOSIS — N644 Mastodynia: Secondary | ICD-10-CM | POA: Diagnosis not present

## 2023-05-22 LAB — COMPREHENSIVE METABOLIC PANEL
ALT: 26 U/L (ref 0–35)
AST: 16 U/L (ref 0–37)
Albumin: 4.6 g/dL (ref 3.5–5.2)
Alkaline Phosphatase: 103 U/L (ref 39–117)
BUN: 11 mg/dL (ref 6–23)
CO2: 27 meq/L (ref 19–32)
Calcium: 9.7 mg/dL (ref 8.4–10.5)
Chloride: 103 meq/L (ref 96–112)
Creatinine, Ser: 0.58 mg/dL (ref 0.40–1.20)
GFR: 115.17 mL/min (ref >60.00)
Glucose, Bld: 108 mg/dL — ABNORMAL HIGH (ref 70–99)
Potassium: 3.8 meq/L (ref 3.5–5.1)
Sodium: 137 meq/L (ref 135–145)
Total Bilirubin: 0.5 mg/dL (ref 0.2–1.2)
Total Protein: 8.1 g/dL (ref 6.0–8.3)

## 2023-05-22 LAB — CBC
HCT: 45 % (ref 36.0–46.0)
Hemoglobin: 14.7 g/dL (ref 12.0–15.0)
MCHC: 32.7 g/dL (ref 30.0–36.0)
MCV: 88.1 fl (ref 78.0–100.0)
Platelets: 340 10*3/uL (ref 150.0–400.0)
RBC: 5.1 Mil/uL (ref 3.87–5.11)
RDW: 12.9 % (ref 11.5–15.5)
WBC: 8.1 10*3/uL (ref 4.0–10.5)

## 2023-05-22 LAB — D-DIMER, QUANTITATIVE: D-Dimer, Quant: 0.34 ug{FEU}/mL (ref <0.50)

## 2023-05-22 LAB — TROPONIN I (HIGH SENSITIVITY): High Sens Troponin I: 4 ng/L (ref 2–17)

## 2023-05-22 LAB — TSH: TSH: 1.49 u[IU]/mL (ref 0.35–5.50)

## 2023-05-22 LAB — FERRITIN: Ferritin: 63.5 ng/mL (ref 10.0–291.0)

## 2023-05-22 LAB — T4, FREE: Free T4: 0.85 ng/dL (ref 0.60–1.60)

## 2023-05-22 MED ORDER — AMOXICILLIN-POT CLAVULANATE 875-125 MG PO TABS: 1.0000 | ORAL_TABLET | Freq: Two times a day (BID) | ORAL | 0 refills | Status: DC

## 2023-05-22 NOTE — Patient Instructions (Signed)
Please go downstairs for labs and a chest X ray before you leave.

## 2023-05-22 NOTE — Progress Notes (Signed)
 Her chest X ray shows an area in her left lung that may be an early infection/pneumonia. I will send in an antibiotic for this. Please schedule a follow up with me in 3-4 weeks.

## 2023-05-22 NOTE — Progress Notes (Signed)
 Subjective:     Patient ID: Monica Steele, female    DOB: Dec 19, 1985, 38 y.o.   MRN: [id number]  Chief Complaint  Patient presents with   Abdominal Pain    Left sided abdominal pain when taking a breathe, has been going on for the last 2 weeks    HPI   History of Present Illness         Medical interpreter on a stick used for the visit.   Here with c/o pain in left lower chest pain, points to bra line.  She also has shortness of breath and fast heart rate. Mild cough, non productive.  She reports pain is localized and no radiation.   She had an upper EGD in January   She denies abdominal pain. No N/V/D.   States she started her monthly period yesterday   Normal exercise stress test in 11/2022 Normal echocardiogram   She is from [place].  She has a daughter    Health Maintenance Due  Topic Date Due   HPV VACCINES (2 - 3-dose SCDM series) 09/09/2020   COVID-19 Vaccine (3 - 2024-25 season) 11/19/2022    Past Medical History:  Diagnosis Date   Allergy    Anxiety    Dry eyes    Gallbladder polyp    GERD (gastroesophageal reflux disease)    H/O seasonal allergies    Palpitations     Past Surgical History:  Procedure Laterality Date   CHOLECYSTECTOMY N/A 07/31/2022   Procedure: LAPAROSCOPIC CHOLECYSTECTOMY;  Surgeon: Diamantina Monks, MD;  Location: [facility];  Service: General;  Laterality: N/A;   COLONOSCOPY  2021   ESOPHAGOGASTRODUODENOSCOPY ENDOSCOPY  2021    Family History  Problem Relation Age of Onset   Healthy Mother    Healthy Father    Colon cancer Neg Hx    Esophageal cancer Neg Hx    Prostate cancer Neg Hx    Rectal cancer Neg Hx     Social History   Socioeconomic History   Marital status: Married    Spouse name: Not on file   Number of children: 2   Years of education: Not on file   Highest education level: 6th grade  Occupational History   Occupation: Nail Tech  Tobacco Use   Smoking status: Never   Smokeless tobacco: Never   Vaping Use   Vaping status: Never Used  Substance and Sexual Activity   Alcohol use: No   Drug use: No   Sexual activity: Not Currently    Birth control/protection: None  Other Topics Concern   Not on file  Social History Narrative   Lives home with husband, Nigel Sloop, and two children.  1 son and 1 daughter. 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  Education 8th grade.  Caffeine one [employer] daily.   Social Drivers of Corporate investment banker Strain: Low Risk  (03/24/2023)   Overall Financial Resource Strain (CARDIA)    Difficulty of Paying Living Expenses: Not very hard  Food Insecurity: Food Insecurity Present (03/24/2023)   Hunger Vital Sign    Worried About Running Out of Food in the Last Year: Often true    Ran Out of Food in the Last Year: Never true  Transportation Needs: No Transportation Needs (03/24/2023)   PRAPARE - Administrator, Civil Service (Medical): No    Lack of Transportation (Non-Medical): No  Physical Activity: Unknown (08/06/2022)   Exercise Vital Sign  Days of Exercise per Week: 0 days    Minutes of Exercise per Session: Not on file  Stress: No Stress Concern Present (08/06/2022)   [employer] of Occupational Health - Occupational Stress Questionnaire    Feeling of Stress : Not at all  Social Connections: Socially Isolated (03/24/2023)   Social Connection and Isolation Panel [NHANES]    Frequency of Communication with Friends and Family: Once a week    Frequency of Social Gatherings with Friends and Family: Once a week    Attends Religious Services: Never    Database administrator or Organizations: No    Attends Engineer, structural: Not on file    Marital Status: Married  Catering manager Violence: Not on file    Outpatient Medications Prior to Visit  Medication Sig Dispense Refill   acetaminophen (TYLENOL) 500 MG tablet Take 2 tablets (1,000 mg total) by mouth 4 (four) times daily. 120 tablet 3   amitriptyline (ELAVIL) 10 MG  tablet TAKE 1 TABLET BY MOUTH EVERYDAY AT BEDTIME 90 tablet 3   esomeprazole (NEXIUM) 40 MG capsule TAKE 1 CAPSULE BY MOUTH TWICE A DAY 180 capsule 1   meloxicam (MOBIC) 15 MG tablet Take 1 tablet (15 mg total) by mouth daily. 30 tablet 0   pantoprazole (PROTONIX) 40 MG tablet Take 40 mg by mouth as needed.     rizatriptan (MAXALT) 10 MG tablet Take 1 tablet (10 mg total) by mouth as needed for migraine. May repeat in 2 hours if needed 10 tablet 0   Vibegron (GEMTESA) 75 MG TABS Take 1 tablet (75 mg total) by mouth daily. 30 tablet 11   docusate sodium (COLACE) 100 MG capsule Take 1 capsule (100 mg total) by mouth 2 (two) times daily. (Patient not taking: Reported on 05/22/2023) 60 capsule 2   meloxicam (MOBIC) 15 MG tablet TAKE 1 TABLET (15 MG TOTAL) BY MOUTH DAILY. (Patient not taking: Reported on 05/22/2023) 30 tablet 0   No facility-administered medications prior to visit.    Allergies  Allergen Reactions   Gadolinium Derivatives Nausea And Vomiting    Pt vomited after multihance injection.     Review of Systems  Constitutional:  Negative for chills, fever, malaise/fatigue and weight loss.  HENT:  Negative for congestion.   Respiratory:  Positive for cough and shortness of breath. Negative for hemoptysis, sputum production and wheezing.   Cardiovascular:  Positive for chest pain and palpitations. Negative for leg swelling.  Gastrointestinal:  Negative for abdominal pain, constipation, diarrhea, nausea and vomiting.  Genitourinary:  Negative for dysuria, frequency and urgency.  Musculoskeletal:  Negative for back pain.  Skin:  Negative for rash.  Neurological:  Negative for dizziness and focal weakness.       Objective:    Physical Exam Constitutional:      General: She is not in acute distress.    Appearance: She is not ill-appearing.  HENT:     Mouth/Throat:     Mouth: Mucous membranes are moist.  Eyes:     Extraocular Movements: Extraocular movements intact.      Conjunctiva/sclera: Conjunctivae normal.  Cardiovascular:     Rate and Rhythm: Normal rate and regular rhythm.  Pulmonary:     Effort: Pulmonary effort is normal.     Breath sounds: Normal breath sounds.  Chest:     Chest wall: Tenderness present. No crepitus.    Abdominal:     General: There is no distension.     Palpations: Abdomen is  soft.     Tenderness: There is no abdominal tenderness. There is no right CVA tenderness, left CVA tenderness, guarding or rebound.  Musculoskeletal:     Cervical back: Normal range of motion and neck supple.     Right lower leg: No edema.     Left lower leg: No edema.  Skin:    General: Skin is warm and dry.  Neurological:     General: No focal deficit present.     Mental Status: She is alert and oriented to person, place, and time.     Motor: No weakness.     Coordination: Coordination normal.     Gait: Gait normal.     Deep Tendon Reflexes: Reflexes normal.  Psychiatric:        Mood and Affect: Mood normal.        Behavior: Behavior normal.        Thought Content: Thought content normal.      BP 118/84 (BP Location: Left Arm, Patient Position: Sitting)   Pulse 90   Temp 97.8 F (36.6 C) (Temporal)   Ht 5' (1.524 m)   Wt 160 lb (72.6 kg)   SpO2 98%   BMI 31.25 kg/m  Wt Readings from Last 3 Encounters:  05/22/23 160 lb (72.6 kg)  05/16/23 164 lb (74.4 kg)  04/24/23 153 lb (69.4 kg)       Assessment & Plan:   Problem List Items Addressed This Visit     Language barrier   Other Visit Diagnoses       Chest pain on breathing    -  Primary   Relevant Orders   CBC (Completed)   Comprehensive metabolic panel (Completed)   Troponin I (High Sensitivity) (Completed)   D-dimer, quantitative (Completed)   DG Chest 2 View (Completed)   EKG 12-Lead     Chest wall tenderness       Relevant Orders   EKG 12-Lead     Acute cough       Relevant Orders   DG Chest 2 View (Completed)     Shortness of breath       Relevant Orders    Troponin I (High Sensitivity) (Completed)   D-dimer, quantitative (Completed)   DG Chest 2 View (Completed)   EKG 12-Lead     Fast heart beat       Relevant Orders   CBC (Completed)   Comprehensive metabolic panel (Completed)   Ferritin (Completed)   TSH (Completed)   T4, free (Completed)   D-dimer, quantitative (Completed)   EKG 12-Lead     Abnormal finding on chest xray          No acute distress.  Medical interpreter used.  EKG shows NSR, rate 83, no ST-T wave changes.  No sign of infectious process on exam.  Reviewed notes and results from cardiology. Hx of similar pain.  Chest X ray ordered. CMP, CBC, Troponin, D-dimer, Ferritin, thyroid function also ordered to look for underlying etiology.  Possible MSK etiology.  Try Tylenol or OTC pain management.    Addendum:  Chest X ray with abnormal finding early pneumonia vs nodule. Treat with antibiotic therapy, Augmentin, and follow up with repeat imaging in 3-4 weeks.    I am having Monica Steele start on amoxicillin-clavulanate. I am also having her maintain her acetaminophen, docusate sodium, pantoprazole, Gemtesa, rizatriptan, amitriptyline, meloxicam, and esomeprazole.  Meds ordered this encounter  Medications   amoxicillin-clavulanate (AUGMENTIN) 875-125 MG tablet    Sig:  Take 1 tablet by mouth 2 (two) times daily.    Dispense:  20 tablet    Refill:  0    Supervising Provider:   Hillard Danker A [[id number]]

## 2023-05-23 NOTE — Telephone Encounter (Signed)
 Please see xray encounter

## 2023-05-29 ENCOUNTER — Other Ambulatory Visit: Payer: Self-pay | Admitting: Sports Medicine

## 2023-05-29 DIAGNOSIS — R300 Dysuria: Secondary | ICD-10-CM | POA: Diagnosis not present

## 2023-06-10 ENCOUNTER — Other Ambulatory Visit: Payer: Self-pay | Admitting: Family Medicine

## 2023-06-12 ENCOUNTER — Ambulatory Visit: Payer: 59 | Admitting: Radiology

## 2023-06-12 ENCOUNTER — Ambulatory Visit (INDEPENDENT_AMBULATORY_CARE_PROVIDER_SITE_OTHER): Payer: 59 | Admitting: Nurse Practitioner

## 2023-06-12 ENCOUNTER — Encounter: Payer: Self-pay | Admitting: Nurse Practitioner

## 2023-06-12 ENCOUNTER — Inpatient Hospital Stay: Admission: RE | Admit: 2023-06-12 | Source: Ambulatory Visit

## 2023-06-12 VITALS — BP 110/72 | HR 94 | Ht 61.0 in | Wt 157.0 lb

## 2023-06-12 DIAGNOSIS — R1011 Right upper quadrant pain: Secondary | ICD-10-CM | POA: Diagnosis not present

## 2023-06-12 DIAGNOSIS — Z9049 Acquired absence of other specified parts of digestive tract: Secondary | ICD-10-CM | POA: Diagnosis not present

## 2023-06-12 DIAGNOSIS — G8929 Other chronic pain: Secondary | ICD-10-CM | POA: Diagnosis not present

## 2023-06-12 NOTE — Patient Instructions (Signed)
 You have been scheduled for an MRI at [facility] on 06/17/23. Your appointment time is 8:00 am. Please arrive to admitting (check in through the emergency room) 30 minutes prior to your appointment time for registration purposes. Please make certain not to have anything to eat or drink 6 hours prior to your test. In addition, if you have any metal in your body, have a pacemaker or defibrillator, please be sure to let your ordering physician know. This test typically takes 45 minutes to 1 hour to complete. Should you need to reschedule, please call [phone] to do so.  Due to recent changes in healthcare laws, you may see the results of your imaging and laboratory studies on MyChart before your provider has had a chance to review them.  We understand that in some cases there may be results that are confusing or concerning to you. Not all laboratory results come back in the same time frame and the provider may be waiting for multiple results in order to interpret others.  Please give [place] 48 hours in order for your provider to thoroughly review all the results before contacting the office for clarification of your results.   Thank you for trusting me with your gastrointestinal care!   Alcide Evener, CRNP

## 2023-06-12 NOTE — Progress Notes (Signed)
 06/12/2023 [facility] Amandalee Lacap [id number] 1985/09/01   Chief Complaint: RUQ pain   History of Present Illness: Monica Steele is a 38 year old female with a past medical history of GERD, globus sensation, right upper quadrant abdominal pain, constipation and colon polyps.  She is known by Dr. Meridee Score.  She speaks [place] therefore she presents with a  [place] interpretor to facilitate communication throughout today's office visit.  She has chronic upper abdominal and less lower abdominal pain for which she previously underwent an extensive GI evaluation by Dr. Orvan Falconer as summarized below.  She previously underwent an extensive GI evaluation by Dr. Orvan Falconer as summarized below, without identify the etiology for her abdominal pains.    H pylori breath test negative 10/01/18 Barium esophagram 11/07/18: normal  Abdominal ultrasound 03/05/19: several small gallbladder polyps, largest measuring 4mm. Gallbladder appeared normal.  EGD 05/21/19: gastric erosions and chronic gastritis.  There was no H. pylori or intestinal metaplasia. Duodenal and esophageal biopsies were normal. Colonoscopy 05/21/19: tubular adenoma, 2 hyperplastic polyps, nonbleeding internal hemorrhoids   She underwent a [facility] HIDA scan  06/05/2022 which showed a gallbladder ejection fraction of 24%. She subsequently underwent a cholecystectomy 08/31/2022 and her upper abdominal pain persisted.    CTAP 12/11/2022 with contrast showed the following: 1. Borderline-mild urinary bladder wall thickening, which may be accentuated by incomplete distention. Correlate with urinalysis to exclude cystitis. 2. Otherwise, no acute abdominopelvic findings. 3. Chronic sclerosis with small subchondral cysts or erosions associated with the bilateral sacroiliac joints, suggesting sequela of sacroiliitis. This has slightly progressed when compared to the prior CT  I last saw Monica Steele in office 02/19/2023 due to having chronic  central upper abdominal and intermittent lower abdominal pain. She was prescribed amitriptyline 10 mg 1 tab p.o. nightly for her abdominal pain and she was scheduled for an EGD.  She subsequently underwent an EGD which was done by Dr. Leone Payor 04/10/2023 which was normal, biopsies were not obtained.  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   She presents today for further evaluation regarding RUQ pain which was present prior to her EGD 03/2023 and occurs daily and is worse after eating.  No specific food triggers.  No nausea or vomiting.  No further lower abdominal pain.  She passes normal brown bowel movement daily.  No rectal bleeding or black stools.  She remains on Esomeprazole 40 mg twice daily.  She takes Meloxicam 15 mg daily as needed.  No fevers or weight loss.  She does not wish to increase at the dose of Amitriptyline for her chronic RUQ pain as she wants to know why she has this pain.     Latest Ref Rng & Units 05/22/2023   11:59 AM 12/06/2022   12:03 PM 07/25/2022    1:45 PM  CBC  WBC 4.0 - 10.5 K/uL 8.1  8.9  9.2   Hemoglobin 12.0 - 15.0 g/dL 40.9  81.1  91.4   Hematocrit 36.0 - 46.0 % 45.0  44.1  45.1   Platelets 150.0 - 400.0 K/uL 340.0  381.0  349        Latest Ref Rng & Units 05/22/2023   11:59 AM 12/06/2022   12:03 PM 11/15/2021    1:36 PM  CMP  Glucose 70 - 99 mg/dL 782  956  213   BUN 6 - 23 mg/dL 11  12  14   Creatinine 0.40 - 1.20 mg/dL 1.61  0.96  0.45   Sodium 135 - 145 mEq/L 137  138  137   Potassium 3.5 - 5.1 mEq/L 3.8  3.9  3.8   Chloride 96 - 112 mEq/L 103  104  104   CO2 19 - 32 mEq/L 27  28  27    Calcium 8.4 - 10.5 mg/dL 9.7  9.7  9.2   Total Protein 6.0 - 8.3 g/dL 8.1  8.0    Total Bilirubin 0.2 - 1.2 mg/dL 0.5  0.4    Alkaline Phos 39 - 117 U/L 103  95    AST 0 - 37 U/L 16  18    ALT 0 - 35 U/L 26  25      Current Outpatient  Medications on File Prior to Visit  Medication Sig Dispense Refill   acetaminophen (TYLENOL) 500 MG tablet Take 2 tablets (1,000 mg total) by mouth 4 (four) times daily. 120 tablet 3   amitriptyline (ELAVIL) 10 MG tablet TAKE 1 TABLET BY MOUTH EVERYDAY AT BEDTIME 90 tablet 3   amoxicillin-clavulanate (AUGMENTIN) 875-125 MG tablet Take 1 tablet by mouth 2 (two) times daily. 20 tablet 0   docusate sodium (COLACE) 100 MG capsule Take 1 capsule (100 mg total) by mouth 2 (two) times daily. 60 capsule 2   esomeprazole (NEXIUM) 40 MG capsule TAKE 1 CAPSULE BY MOUTH TWICE A DAY 180 capsule 1   meloxicam (MOBIC) 15 MG tablet Take 1 tablet (15 mg total) by mouth daily. 30 tablet 0   rizatriptan (MAXALT) 10 MG tablet Take 1 tablet (10 mg total) by mouth as needed for migraine. May repeat in 2 hours if needed 10 tablet 0   Vibegron (GEMTESA) 75 MG TABS Take 1 tablet (75 mg total) by mouth daily. 30 tablet 11   No current facility-administered medications on file prior to visit.   Allergies  Allergen Reactions   Gadolinium Derivatives Nausea And Vomiting    Pt vomited after multihance injection.    Current Medications, Allergies, Past Medical History, Past Surgical History, Family History and Social History were reviewed in [employer] record.  Review of Systems:   Constitutional: Negative for fever, sweats, chills or weight loss.  Respiratory: Negative for shortness of breath.   Cardiovascular: Negative for chest pain, palpitations and leg swelling.  Gastrointestinal: See HPI.  Musculoskeletal: Negative for back pain or muscle aches.  Neurological: Negative for dizziness, headaches or paresthesias.   Physical Exam: LMP 05/20/2023  BP 110/72   Pulse 94   Ht 5\' 1"  (1.549 m)   Wt 157 lb (71.2 kg)   LMP 05/20/2023   BMI 29.66 kg/m   General: 39 year old female in no acute distress. Head: Normocephalic and atraumatic. Eyes: No scleral icterus. Conjunctiva pink . Ears:  Normal auditory acuity. Mouth: Dentition intact. No ulcers or lesions.  Lungs: Clear throughout to auscultation. Heart: Regular rate and rhythm, no murmur. Abdomen: Soft, nondistended.  Moderate RUQ tenderness at the right costal margin which extends 3 to 4 cm below the costal margin without palpable mass or hepatomegaly.  Normal bowel sounds x 4 quadrants.  Rectal: Deferred.  Musculoskeletal: Symmetrical with no gross deformities. Extremities: No edema. Neurological: Alert oriented x 4. No focal deficits.  Psychological: Alert and cooperative. Normal mood and affect  Assessment and Recommendations:  38 year old female with chronic upper abdominal pain, more pronounced RUQ pain for the past 2 to 3 months. Normal LFTs.  EGD 03/2023 was normal.  CTAP 11/2022 findings did not explain her symptoms.  No improvement status post cholecystectomy 08/2022.  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 -Abdominal MRI to further evaluate the liver, to rule out any pathology potentially missed by CT imaging -If abdominal MRI unrevealing I recommend a second GI opinion at [facility] or [facility] as the patient wishes to pursue specific etiology for her chronic abdominal pain  Today's encounter was 25 minutes which included precharting, chart/result review, history/exam, face-to-face time used for counseling, formulating a treatment plan with follow-up and documentation with vietnamese translation.

## 2023-06-12 NOTE — Progress Notes (Signed)
 Attending Physician's Attestation   I have reviewed the chart.   I agree with the Advanced Practitioner's note, impression, and recommendations with any updates as below. Recommend consideration of [employer] H. Pylori testing, since no biopsies obtained on recent EGD. Agree with additional Quaternary evaluation for 2nd opinion if needed.   Corliss Parish, MD [facility] Office # [phone]

## 2023-06-13 ENCOUNTER — Telehealth: Payer: Self-pay | Admitting: *Deleted

## 2023-06-13 DIAGNOSIS — K219 Gastro-esophageal reflux disease without esophagitis: Secondary | ICD-10-CM

## 2023-06-13 DIAGNOSIS — R1084 Generalized abdominal pain: Secondary | ICD-10-CM

## 2023-06-13 NOTE — Telephone Encounter (Signed)
 With the help of [place] Interpreter, [place], I have spoken to patient and advised we would like her to complete stool testing to check for H Pylori bacteria that could possibly cause some of her symptoms. Patient is agreeable to doing this. Stool kit placed at 3rd floor front desk and patient says she will return this to the lab once completed.

## 2023-06-13 NOTE — Progress Notes (Signed)
 Linda/Dottie, pls contact Vietnamese interpretor and let her know Dr. Meridee Score wants her to do a Diatherix H. Pylori stool antigen test. Pls enter order and provide her with the diatherix H. Pylori kit. THX.

## 2023-06-13 NOTE — Telephone Encounter (Addendum)
 Arnaldo Natal, NP at 06/12/2023 10:30 AM  Status: Signed  Linda/Dottie, pls contact Vietnamese interpretor and let her know Dr. Meridee Score wants her to do a Diatherix H. Pylori stool antigen test. Pls enter order and provide her with the diatherix H. Pylori kit. THX.    Mansouraty, Netty Starring., MD at 06/12/2023 10:30 AM  Status: Signed   Attending Physician's Attestation    I have reviewed the chart.    I agree with the Advanced Practitioner's note, impression, and recommendations with any updates as below. Recommend consideration of Diatherix H. Pylori testing, since no biopsies obtained on recent EGD. Agree with additional Quaternary evaluation for 2nd opinion if needed.     Corliss Parish, MD [facility] Office # [phone]

## 2023-06-17 ENCOUNTER — Ambulatory Visit (HOSPITAL_COMMUNITY)
Admission: RE | Admit: 2023-06-17 | Discharge: 2023-06-17 | Disposition: A | Discharge status: Home / Self Care | Source: Ambulatory Visit | Attending: Nurse Practitioner | Admitting: Nurse Practitioner

## 2023-06-17 DIAGNOSIS — R1011 Right upper quadrant pain: Secondary | ICD-10-CM | POA: Insufficient documentation

## 2023-06-17 DIAGNOSIS — Z9049 Acquired absence of other specified parts of digestive tract: Secondary | ICD-10-CM | POA: Diagnosis not present

## 2023-06-17 MED ORDER — GADOBUTROL 1 MMOL/ML IV SOLN
7.0000 mL | Freq: Once | INTRAVENOUS | Status: AC | PRN
  Administered 2023-06-17: 7 mL via INTRAVENOUS

## 2023-06-18 NOTE — Progress Notes (Unsigned)
    Monica Steele D.[facility] [address] [place] [address] Phone: [phone]   Assessment and Plan:     There are no diagnoses linked to this encounter.  ***   Pertinent previous records reviewed include ***    Follow Up: ***     Subjective:   I,  , am serving as a Neurosurgeon for Doctor Richardean Sale   Chief Complaint: right hip pain    HPI:    09/05/2022 Patient is a 38 year old female complaining of right hip pain. Patient states right hip pain for 3 months, no MOI, radiating down to the right butt cheek, to the leg and has pain when walking,no meds for the pain,     12/06/2022 Patient states her pain has come back the last couple of days. No MOI   04/24/2023 Patient states she is her for left hand. Pain for about 3 weeks. No MOI. Pain started at the elbow and radiated down to her hand and fingers with numbness to 4 and 5. No meds for the pain. Decreased grip strength.  Top of left foot is tingling as well   06/19/2023 Patient states     Relevant Historical Information: GER  Additional pertinent review of systems negative.   Current Outpatient Medications:    acetaminophen (TYLENOL) 500 MG tablet, Take 2 tablets (1,000 mg total) by mouth 4 (four) times daily., Disp: 120 tablet, Rfl: 3   amitriptyline (ELAVIL) 10 MG tablet, TAKE 1 TABLET BY MOUTH EVERYDAY AT BEDTIME, Disp: 90 tablet, Rfl: 3   amoxicillin-clavulanate (AUGMENTIN) 875-125 MG tablet, Take 1 tablet by mouth 2 (two) times daily., Disp: 20 tablet, Rfl: 0   docusate sodium (COLACE) 100 MG capsule, Take 1 capsule (100 mg total) by mouth 2 (two) times daily., Disp: 60 capsule, Rfl: 2   esomeprazole (NEXIUM) 40 MG capsule, TAKE 1 CAPSULE BY MOUTH TWICE A DAY, Disp: 180 capsule, Rfl: 1   meloxicam (MOBIC) 15 MG tablet, Take 1 tablet (15 mg total) by mouth daily., Disp: 30 tablet, Rfl: 0   rizatriptan (MAXALT) 10 MG tablet, Take 1 tablet (10 mg total) by mouth  as needed for migraine. May repeat in 2 hours if needed, Disp: 10 tablet, Rfl: 0   Vibegron (GEMTESA) 75 MG TABS, Take 1 tablet (75 mg total) by mouth daily., Disp: 30 tablet, Rfl: 11   Objective:     There were no vitals filed for this visit.    There is no height or weight on file to calculate BMI.    Physical Exam:    ***   Electronically signed by:  Monica Steele D.[facility] 7:34 AM 06/18/23

## 2023-06-19 ENCOUNTER — Encounter: Payer: Self-pay | Admitting: Family Medicine

## 2023-06-19 ENCOUNTER — Ambulatory Visit (INDEPENDENT_AMBULATORY_CARE_PROVIDER_SITE_OTHER)

## 2023-06-19 ENCOUNTER — Ambulatory Visit (INDEPENDENT_AMBULATORY_CARE_PROVIDER_SITE_OTHER): Admitting: Family Medicine

## 2023-06-19 ENCOUNTER — Ambulatory Visit (INDEPENDENT_AMBULATORY_CARE_PROVIDER_SITE_OTHER): Admitting: Sports Medicine

## 2023-06-19 VITALS — BP 110/70 | HR 80 | Ht 61.0 in | Wt 158.0 lb

## 2023-06-19 VITALS — BP 126/84 | HR 94 | Temp 97.8°F | Ht 61.0 in | Wt 157.0 lb

## 2023-06-19 DIAGNOSIS — G5622 Lesion of ulnar nerve, left upper limb: Secondary | ICD-10-CM | POA: Diagnosis not present

## 2023-06-19 DIAGNOSIS — J302 Other seasonal allergic rhinitis: Secondary | ICD-10-CM | POA: Diagnosis not present

## 2023-06-19 DIAGNOSIS — Z758 Other problems related to medical facilities and other health care: Secondary | ICD-10-CM | POA: Diagnosis not present

## 2023-06-19 DIAGNOSIS — R202 Paresthesia of skin: Secondary | ICD-10-CM

## 2023-06-19 DIAGNOSIS — Z603 Acculturation difficulty: Secondary | ICD-10-CM

## 2023-06-19 DIAGNOSIS — R9389 Abnormal findings on diagnostic imaging of other specified body structures: Secondary | ICD-10-CM

## 2023-06-19 DIAGNOSIS — R918 Other nonspecific abnormal finding of lung field: Secondary | ICD-10-CM | POA: Diagnosis not present

## 2023-06-19 DIAGNOSIS — R0602 Shortness of breath: Secondary | ICD-10-CM

## 2023-06-19 DIAGNOSIS — R200 Anesthesia of skin: Secondary | ICD-10-CM | POA: Diagnosis not present

## 2023-06-19 LAB — POCT URINE PREGNANCY: Preg Test, Ur: NEGATIVE

## 2023-06-19 MED ORDER — METHYLPREDNISOLONE 4 MG PO TBPK: ORAL_TABLET | ORAL | 0 refills | Status: DC

## 2023-06-19 NOTE — Patient Instructions (Signed)
 Prednisone dose pack Continue home exercises. Think about ways you might possibly be irritating your elbow or knee If no better in 2 weeks recommend calling and asking for the nerve pain medication called gabapentin. Follow up in 4 to 6 weeks.

## 2023-06-19 NOTE — Progress Notes (Signed)
 Subjective:     Patient ID: Monica Steele, female    DOB: 26-Jul-1985, 38 y.o.   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   Here for follow up on abnormal chest X ray. States her breathing feels better since completing the antibiotic. No chest pain. No cough. No new symptoms.    LMP: 05/20/2022 Not on birth control.     Health Maintenance Due  Topic Date Due   HPV VACCINES (2 - 3-dose SCDM series) 09/09/2020   COVID-19 Vaccine (3 - 2024-25 season) 11/19/2022    Past Medical History:  Diagnosis Date   Allergy    Anxiety    Dry eyes    Gallbladder polyp    GERD (gastroesophageal reflux disease)    H/O seasonal allergies    Palpitations     Past Surgical History:  Procedure Laterality Date   CHOLECYSTECTOMY N/A 07/31/2022   Procedure: LAPAROSCOPIC CHOLECYSTECTOMY;  Surgeon: Diamantina Monks, MD;  Location: [facility];  Service: General;  Laterality: N/A;   COLONOSCOPY  2021   ESOPHAGOGASTRODUODENOSCOPY ENDOSCOPY  2021   UPPER GASTROINTESTINAL ENDOSCOPY      Family History  Problem Relation Age of Onset   Healthy Mother    Healthy Father    Colon cancer Neg Hx    Esophageal cancer Neg Hx    Prostate cancer Neg Hx    Rectal cancer Neg Hx     Social History   Socioeconomic History   Marital status: Married    Spouse name: Not on file   Number of children: 2   Years of education: Not on file   Highest education level: 6th grade  Occupational History   Occupation: Nail Tech  Tobacco Use   Smoking status: Never   Smokeless tobacco: Never  Vaping Use   Vaping status: Never Used  Substance and Sexual Activity   Alcohol use: No   Drug use: No   Sexual activity: Not Currently    Birth control/protection: None  Other Topics Concern   Not on file   Social History Narrative   Lives home with husband, Nigel Sloop, and two children.  1 son and 1 daughter. 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  Education 8th grade.  Caffeine one [employer] daily.   Social Drivers of Corporate investment banker Strain: Low Risk  (03/24/2023)   Overall Financial Resource Strain (CARDIA)    Difficulty of Paying Living Expenses: Not very hard  Food Insecurity: Food Insecurity Present (03/24/2023)   Hunger Vital Sign    Worried About Running Out of Food in the Last Year: Often true    Ran Out of Food in the Last Year: Never true  Transportation Needs: No Transportation Needs (03/24/2023)   PRAPARE - Administrator, Civil Service (Medical): No    Lack of Transportation (Non-Medical): No  Physical Activity: Unknown (08/06/2022)   Exercise Vital Sign    Days of Exercise per Week: 0 days    Minutes of Exercise per Session: Not on file  Stress: No Stress Concern Present (08/06/2022)   [employer] of Occupational Health - Occupational Stress Questionnaire    Feeling of Stress : Not at all  Social Connections: Socially Isolated (03/24/2023)   Social Connection and Isolation Panel [NHANES]    Frequency of Communication with Friends and Family: Once a week    Frequency of Social Gatherings with Friends and Family: Once a week    Attends Religious Services: Never    Database administrator or Organizations: No    Attends Engineer, structural: Not on file    Marital Status: Married  Catering manager Violence: Not on file    Outpatient Medications Prior to Visit  Medication Sig Dispense Refill   acetaminophen (TYLENOL) 500 MG tablet Take 2 tablets (1,000 mg total) by mouth 4 (four) times daily. 120 tablet 3   amitriptyline (ELAVIL) 10 MG tablet TAKE 1 TABLET BY MOUTH EVERYDAY AT BEDTIME 90 tablet 3   docusate sodium (COLACE) 100 MG capsule Take 1 capsule (100 mg total) by mouth 2 (two) times daily. 60 capsule 2   esomeprazole (NEXIUM) 40 MG  capsule TAKE 1 CAPSULE BY MOUTH TWICE A DAY 180 capsule 1   meloxicam (MOBIC) 15 MG tablet Take 1 tablet (15 mg total) by mouth daily. 30 tablet 0   rizatriptan (MAXALT) 10 MG tablet Take 1 tablet (10 mg total) by mouth as needed for migraine. May repeat in 2 hours if needed 10 tablet 0   Vibegron (GEMTESA) 75 MG TABS Take 1 tablet (75 mg total) by mouth daily. 30 tablet 11   amoxicillin-clavulanate (AUGMENTIN) 875-125 MG tablet Take 1 tablet by mouth 2 (two) times daily. 20 tablet 0   No facility-administered medications prior to visit.    Allergies  Allergen Reactions   Gadolinium Derivatives Nausea And Vomiting    Pt vomited after multihance injection.     Review of Systems  Constitutional:  Negative for chills, fever and malaise/fatigue.  Respiratory:  Negative for cough, sputum production, shortness of breath and wheezing.   Cardiovascular:  Negative for chest pain, palpitations and leg swelling.  Gastrointestinal:  Negative for abdominal pain, constipation, diarrhea, nausea and vomiting.  Neurological:  Negative for dizziness and focal weakness.       Objective:    Physical Exam Constitutional:      General: She is not in acute distress.    Appearance: She is not ill-appearing.  HENT:     Mouth/Throat:     Mouth: Mucous membranes are moist.  Eyes:     Extraocular Movements: Extraocular movements intact.     Conjunctiva/sclera: Conjunctivae normal.  Cardiovascular:     Rate and Rhythm: Normal rate and regular rhythm.  Pulmonary:     Effort: Pulmonary effort is normal.     Breath sounds: Normal breath sounds.  Musculoskeletal:     Cervical back: Normal range of motion and neck supple.     Right lower leg: No edema.     Left lower leg: No edema.  Skin:    General: Skin is warm and dry.  Neurological:     General: No focal deficit present.     Mental Status: She is alert and oriented to person, place, and time.     Motor: No weakness.     Coordination:  Coordination normal.     Gait: Gait normal.  Psychiatric:        Mood and Affect: Mood normal.        Behavior: Behavior normal.        Thought Content: Thought content normal.      BP 126/84 (BP Location: Left Arm, Patient Position: Sitting)  Pulse 94   Temp 97.8 F (36.6 C) (Temporal)   Ht 5\' 1"  (1.549 m)   Wt 157 lb (71.2 kg)   LMP 05/20/2023   SpO2 99%   BMI 29.66 kg/m  Wt Readings from Last 3 Encounters:  06/19/23 158 lb (71.7 kg)  06/19/23 157 lb (71.2 kg)  06/12/23 157 lb (71.2 kg)       Assessment & Plan:   Problem List Items Addressed This Visit     Language barrier   Seasonal allergies   Other Visit Diagnoses       Abnormal finding on chest xray    -  Primary   Relevant Orders   DG Chest 2 View   POCT urine pregnancy (Completed)     Shortness of breath       Relevant Orders   DG Chest 2 View      Urine preg done since LMP 4 wks ago. Negative result.  Reviewed labs from previous visit with patient. Negative D-dimer, troponin  Symptoms have essentially resolved.  Chest X ray ordered. Follow up pending results.   I have discontinued Mystique N. Urbanek's amoxicillin-clavulanate. I am also having her maintain her acetaminophen, docusate sodium, Gemtesa, rizatriptan, amitriptyline, meloxicam, and esomeprazole.  No orders of the defined types were placed in this encounter.

## 2023-06-25 ENCOUNTER — Encounter: Payer: Self-pay | Admitting: Gastroenterology

## 2023-06-25 NOTE — Progress Notes (Signed)
 [facility] H. pylori stool testing Negative  The patient will be updated in his results will be scanned into the chart.   Corliss Parish, MD [facility] Office # [phone]

## 2023-07-16 ENCOUNTER — Encounter: Payer: Self-pay | Admitting: Gastroenterology

## 2023-08-01 ENCOUNTER — Ambulatory Visit (INDEPENDENT_AMBULATORY_CARE_PROVIDER_SITE_OTHER): Admitting: Family Medicine

## 2023-08-01 ENCOUNTER — Encounter: Payer: Self-pay | Admitting: Family Medicine

## 2023-08-01 VITALS — BP 108/80 | HR 84 | Temp 97.7°F | Ht 61.0 in | Wt 158.0 lb

## 2023-08-01 DIAGNOSIS — R202 Paresthesia of skin: Secondary | ICD-10-CM | POA: Diagnosis not present

## 2023-08-01 DIAGNOSIS — Z603 Acculturation difficulty: Secondary | ICD-10-CM | POA: Diagnosis not present

## 2023-08-01 DIAGNOSIS — R200 Anesthesia of skin: Secondary | ICD-10-CM | POA: Diagnosis not present

## 2023-08-01 DIAGNOSIS — Z758 Other problems related to medical facilities and other health care: Secondary | ICD-10-CM

## 2023-08-01 DIAGNOSIS — J0140 Acute pansinusitis, unspecified: Secondary | ICD-10-CM | POA: Diagnosis not present

## 2023-08-01 DIAGNOSIS — J302 Other seasonal allergic rhinitis: Secondary | ICD-10-CM

## 2023-08-01 MED ORDER — FLUTICASONE PROPIONATE 50 MCG/ACT NA SUSP: 2.0000 | Freq: Every day | NASAL | 1 refills | Status: DC

## 2023-08-01 MED ORDER — GABAPENTIN 100 MG PO CAPS: 100.0000 mg | ORAL_CAPSULE | Freq: Every day | ORAL | 0 refills | Status: DC

## 2023-08-01 MED ORDER — AMOXICILLIN 875 MG PO TABS: 875.0000 mg | ORAL_TABLET | Freq: Two times a day (BID) | ORAL | 0 refills | Status: AC

## 2023-08-01 NOTE — Patient Instructions (Signed)
 Ok to start gabapentin  for arm and leg numbness and tingling.   Please follow up with Dr. Cleora Daft as recommended.   Take the antibiotic and use the Flonase  nasal spray for sinus infection.

## 2023-08-01 NOTE — Progress Notes (Signed)
 Subjective:     Patient ID: Monica Steele, female    DOB: 1985-11-07, 38 y.o.   MRN: [id number]  Chief Complaint  Patient presents with   Medical Management of Chronic Issues    Left arm and leg are numb, been going on for the last month  Right side of head hurts for about a week, seeing spots sometimes. Feels fatigued. Eyes are "sleepy" wants to be tested for sinus infection    HPI  History of Present Illness         Here with c/o left arm and left leg numbness and tingling and tingling x 2-3 months No weakness. She is able to use her arm and leg as usual. Does not notice it when sleeping. She saw Dr. Cleora Daft for this. Took a course of oral steroids and states she did not notice improvement.   States she is only taking Gemtesa  daily.    C/o sinus pain, worse on left with thick purulent nasal drainage x 2 weeks. Ears are full. Headache on right as well.  No fever, chills, dizziness, N/V/D.    Health Maintenance Due  Topic Date Due   HPV VACCINES (2 - 3-dose SCDM series) 09/09/2020    Past Medical History:  Diagnosis Date   Allergy    Anxiety    Dry eyes    Gallbladder polyp    GERD (gastroesophageal reflux disease)    H/O seasonal allergies    Palpitations     Past Surgical History:  Procedure Laterality Date   CHOLECYSTECTOMY N/A 07/31/2022   Procedure: LAPAROSCOPIC CHOLECYSTECTOMY;  Surgeon: Anda Bamberg, MD;  Location: [facility];  Service: General;  Laterality: N/A;   COLONOSCOPY  2021   ESOPHAGOGASTRODUODENOSCOPY ENDOSCOPY  2021   UPPER GASTROINTESTINAL ENDOSCOPY      Family History  Problem Relation Age of Onset   Healthy Mother    Healthy Father    Colon cancer Neg Hx    Esophageal cancer Neg Hx    Prostate cancer Neg Hx    Rectal cancer Neg Hx     Social History   Socioeconomic History   Marital status: Married    Spouse name: Not on file   Number of children: 2   Years of education: Not on file   Highest education level: 6th grade   Occupational History   Occupation: Nail Tech  Tobacco Use   Smoking status: Never   Smokeless tobacco: Never  Vaping Use   Vaping status: Never Used  Substance and Sexual Activity   Alcohol use: No   Drug use: No   Sexual activity: Not Currently    Birth control/protection: None  Other Topics Concern   Not on file  Social History Narrative   Lives home with husband, Elie Grove, and two children.  1 son and 1 daughter. 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  Education 8th grade.  Caffeine one [employer]bucks daily.   Social Drivers of Corporate investment banker Strain: Low Risk  (03/24/2023)   Overall Financial Resource Strain (CARDIA)    Difficulty of Paying Living Expenses: Not very hard  Food Insecurity: Food Insecurity Present (03/24/2023)   Hunger Vital Sign    Worried About Running Out of Food in the Last Year: Often true    Ran Out of Food in the Last Year: Never true  Transportation Needs: No Transportation Needs (03/24/2023)   PRAPARE - Administrator, Civil Service (Medical): No  Lack of Transportation (Non-Medical): No  Physical Activity: Unknown (08/06/2022)   Exercise Vital Sign    Days of Exercise per Week: 0 days    Minutes of Exercise per Session: Not on file  Stress: No Stress Concern Present (08/06/2022)   [employer] of Occupational Health - Occupational Stress Questionnaire    Feeling of Stress : Not at all  Social Connections: Socially Isolated (03/24/2023)   Social Connection and Isolation Panel [NHANES]    Frequency of Communication with Friends and Family: Once a week    Frequency of Social Gatherings with Friends and Family: Once a week    Attends Religious Services: Never    Database administrator or Organizations: No    Attends Engineer, structural: Not on file    Marital Status: Married  Catering manager Violence: Not on file    Outpatient Medications Prior to Visit  Medication Sig Dispense Refill   amitriptyline  (ELAVIL ) 10 MG  tablet TAKE 1 TABLET BY MOUTH EVERYDAY AT BEDTIME 90 tablet 3   esomeprazole  (NEXIUM ) 40 MG capsule TAKE 1 CAPSULE BY MOUTH TWICE A DAY 180 capsule 1   meloxicam  (MOBIC ) 15 MG tablet Take 1 tablet (15 mg total) by mouth daily. 30 tablet 0   rizatriptan  (MAXALT ) 10 MG tablet Take 1 tablet (10 mg total) by mouth as needed for migraine. May repeat in 2 hours if needed 10 tablet 0   Vibegron  (GEMTESA ) 75 MG TABS Take 1 tablet (75 mg total) by mouth daily. 30 tablet 11   methylPREDNISolone  (MEDROL  DOSEPAK) 4 MG TBPK tablet Follow instructions on package. 21 tablet 0   No facility-administered medications prior to visit.    Allergies  Allergen Reactions   Gadolinium Derivatives Nausea And Vomiting    Pt vomited after multihance  injection.     Review of Systems  Constitutional:  Negative for chills, fever and malaise/fatigue.  HENT:  Positive for congestion, ear pain and sinus pain. Negative for sore throat.   Eyes:  Negative for blurred vision and double vision.  Respiratory:  Negative for cough, sputum production, shortness of breath and wheezing.   Cardiovascular:  Negative for chest pain, palpitations and leg swelling.  Gastrointestinal:  Negative for abdominal pain, constipation, diarrhea, nausea and vomiting.  Genitourinary:  Negative for dysuria, frequency and urgency.  Musculoskeletal:  Negative for back pain, falls and neck pain.  Skin:  Negative for rash.  Neurological:  Positive for tingling and headaches. Negative for dizziness and focal weakness.       Objective:     Physical Exam Constitutional:      General: She is not in acute distress.    Appearance: She is not ill-appearing.  HENT:     Right Ear: Tympanic membrane and ear canal normal.     Left Ear: Tympanic membrane and ear canal normal.     Nose: Congestion present.     Right Sinus: No maxillary sinus tenderness or frontal sinus tenderness.     Left Sinus: No maxillary sinus tenderness or frontal sinus  tenderness.     Mouth/Throat:     Mouth: Mucous membranes are moist.     Pharynx: Posterior oropharyngeal erythema present.  Eyes:     Extraocular Movements: Extraocular movements intact.     Conjunctiva/sclera: Conjunctivae normal.     Pupils: Pupils are equal, round, and reactive to light.  Cardiovascular:     Rate and Rhythm: Normal rate and regular rhythm.  Pulmonary:     Effort: Pulmonary  effort is normal.     Breath sounds: Normal breath sounds.  Musculoskeletal:        General: Normal range of motion.     Cervical back: Normal range of motion and neck supple. No tenderness.     Right lower leg: No edema.     Left lower leg: No edema.  Lymphadenopathy:     Cervical: No cervical adenopathy.  Skin:    General: Skin is warm and dry.  Neurological:     General: No focal deficit present.     Mental Status: She is alert and oriented to person, place, and time.     Cranial Nerves: No cranial nerve deficit.     Motor: No weakness.     Coordination: Coordination normal.     Gait: Gait normal.  Psychiatric:        Mood and Affect: Mood normal.        Behavior: Behavior normal.        Thought Content: Thought content normal.      BP 108/80 (BP Location: Left Arm, Patient Position: Sitting)   Pulse 84   Temp 97.7 F (36.5 C) (Temporal)   Ht 5\' 1"  (1.549 m)   Wt 158 lb (71.7 kg)   SpO2 99%   BMI 29.85 kg/m  Wt Readings from Last 3 Encounters:  08/01/23 158 lb (71.7 kg)  06/19/23 158 lb (71.7 kg)  06/19/23 157 lb (71.2 kg)       Assessment & Plan:   Problem List Items Addressed This Visit     Language barrier   Seasonal allergies   Other Visit Diagnoses       Acute pansinusitis, recurrence not specified    -  Primary   Relevant Medications   amoxicillin  (AMOXIL ) 875 MG tablet   fluticasone  (FLONASE ) 50 MCG/ACT nasal spray     Numbness and tingling of left upper and lower extremity          Medical interpreter in person.  No red flag symptoms. Ongoing  left upper and lower extremity numbness and tingling without weakness x 2-3 months. No worsening. Not improved with oral steroids.  She saw Dr. Cleora Daft for the same 6 wks ago and advised to follow up if not improving.  Denies any concern for pregnancy.  Try gabapentin  100 mg at bedtime. Follow up with Dr. Cleora Daft.  Amoxicillin  and Flonase  for acute sinusitis with symptoms 2 wks.  Follow up if worsening or not improving. Allergies also playing a role.    I have discontinued Charyl N. Araki's methylPREDNISolone . I am also having her start on amoxicillin , fluticasone , and gabapentin . Additionally, I am having her maintain her Gemtesa , rizatriptan , amitriptyline , meloxicam , and esomeprazole .  Meds ordered this encounter  Medications   amoxicillin  (AMOXIL ) 875 MG tablet    Sig: Take 1 tablet (875 mg total) by mouth 2 (two) times daily for 10 days.    Dispense:  20 tablet    Refill:  0    Supervising Provider:   Bambi Lever A [[id number]]   fluticasone  (FLONASE ) 50 MCG/ACT nasal spray    Sig: Place 2 sprays into both nostrils daily.    Dispense:  16 g    Refill:  1    Supervising Provider:   Bambi Lever A [[id number]]   gabapentin  (NEURONTIN ) 100 MG capsule    Sig: Take 1 capsule (100 mg total) by mouth at bedtime.    Dispense:  30 capsule    Refill:  0  Supervising Provider:   Bambi Lever A [[id number]]

## 2023-08-21 ENCOUNTER — Ambulatory Visit: Admitting: Sports Medicine

## 2023-08-27 NOTE — Progress Notes (Unsigned)
    [name] D.[facility] [address] [place] [address] Phone: [phone]   Assessment and Plan:     There are no diagnoses linked to this encounter.  ***   Pertinent previous records reviewed include ***    Follow Up: ***     Subjective:   I, Monica Steele, am serving as a Neurosurgeon for Doctor Ulysees Gander  Chief Complaint: right hip pain    HPI:    09/05/2022 Patient is a 38 year old female complaining of right hip pain. Patient states right hip pain for 3 months, no MOI, radiating down to the right butt cheek, to the leg and has pain when walking,no meds for the pain,     12/06/2022 Patient states her pain has come back the last couple of days. No MOI   04/24/2023 Patient states she is her for left hand. Pain for about 3 weeks. No MOI. Pain started at the elbow and radiated down to her hand and fingers with numbness to 4 and 5. No meds for the pain. Decreased grip strength.  Top of left foot is tingling as well    06/19/2023 Patient states numbness in left arm and left leg and some times the tingling sensation. It going on all day long. Going on for about two months now. When she sits down and starts moving her leg, that helps sometimes. If she starts moving the arm up and down and massages the arm gently that does help some. Does not effect sleep at night.   08/28/2023 Patient states   Relevant Historical Information: GER  Additional pertinent review of systems negative.   Current Outpatient Medications:    amitriptyline  (ELAVIL ) 10 MG tablet, TAKE 1 TABLET BY MOUTH EVERYDAY AT BEDTIME, Disp: 90 tablet, Rfl: 3   esomeprazole  (NEXIUM ) 40 MG capsule, TAKE 1 CAPSULE BY MOUTH TWICE A DAY, Disp: 180 capsule, Rfl: 1   fluticasone  (FLONASE ) 50 MCG/ACT nasal spray, Place 2 sprays into both nostrils daily., Disp: 16 g, Rfl: 1   gabapentin  (NEURONTIN ) 100 MG capsule, Take 1 capsule (100 mg total) by mouth at bedtime., Disp: 30  capsule, Rfl: 0   meloxicam  (MOBIC ) 15 MG tablet, Take 1 tablet (15 mg total) by mouth daily., Disp: 30 tablet, Rfl: 0   rizatriptan  (MAXALT ) 10 MG tablet, Take 1 tablet (10 mg total) by mouth as needed for migraine. May repeat in 2 hours if needed, Disp: 10 tablet, Rfl: 0   Vibegron  (GEMTESA ) 75 MG TABS, Take 1 tablet (75 mg total) by mouth daily., Disp: 30 tablet, Rfl: 11   Objective:     There were no vitals filed for this visit.    There is no height or weight on file to calculate BMI.    Physical Exam:    ***   Electronically signed by:  Marshall Skeeter D.[facility] 7:41 AM 08/27/23

## 2023-08-28 ENCOUNTER — Other Ambulatory Visit: Payer: Self-pay

## 2023-08-28 ENCOUNTER — Ambulatory Visit (INDEPENDENT_AMBULATORY_CARE_PROVIDER_SITE_OTHER): Admitting: Sports Medicine

## 2023-08-28 ENCOUNTER — Other Ambulatory Visit: Payer: Self-pay | Admitting: Family Medicine

## 2023-08-28 ENCOUNTER — Ambulatory Visit
Admission: EM | Admit: 2023-08-28 | Discharge: 2023-08-28 | Disposition: A | Discharge status: Home / Self Care | Attending: Family Medicine | Admitting: Family Medicine

## 2023-08-28 ENCOUNTER — Emergency Department (HOSPITAL_BASED_OUTPATIENT_CLINIC_OR_DEPARTMENT_OTHER)
Admission: EM | Admit: 2023-08-28 | Discharge: 2023-08-28 | Disposition: A | Discharge status: Home / Self Care | Attending: Emergency Medicine | Admitting: Emergency Medicine

## 2023-08-28 ENCOUNTER — Emergency Department (HOSPITAL_BASED_OUTPATIENT_CLINIC_OR_DEPARTMENT_OTHER)

## 2023-08-28 VITALS — HR 78 | Ht 61.0 in | Wt 161.0 lb

## 2023-08-28 DIAGNOSIS — R200 Anesthesia of skin: Secondary | ICD-10-CM

## 2023-08-28 DIAGNOSIS — G43809 Other migraine, not intractable, without status migrainosus: Secondary | ICD-10-CM | POA: Insufficient documentation

## 2023-08-28 DIAGNOSIS — R42 Dizziness and giddiness: Secondary | ICD-10-CM

## 2023-08-28 DIAGNOSIS — G5622 Lesion of ulnar nerve, left upper limb: Secondary | ICD-10-CM | POA: Diagnosis not present

## 2023-08-28 DIAGNOSIS — R519 Headache, unspecified: Secondary | ICD-10-CM | POA: Diagnosis not present

## 2023-08-28 DIAGNOSIS — R55 Syncope and collapse: Secondary | ICD-10-CM | POA: Diagnosis not present

## 2023-08-28 DIAGNOSIS — N941 Unspecified dyspareunia: Secondary | ICD-10-CM | POA: Diagnosis not present

## 2023-08-28 DIAGNOSIS — M26621 Arthralgia of right temporomandibular joint: Secondary | ICD-10-CM

## 2023-08-28 DIAGNOSIS — R202 Paresthesia of skin: Secondary | ICD-10-CM

## 2023-08-28 LAB — CBC WITH DIFFERENTIAL/PLATELET
Abs Immature Granulocytes: 0.03 10*3/uL (ref 0.00–0.07)
Basophils Absolute: 0.1 10*3/uL (ref 0.0–0.1)
Basophils Relative: 1 %
Eosinophils Absolute: 0.1 10*3/uL (ref 0.0–0.5)
Eosinophils Relative: 1 %
HCT: 46.9 % — ABNORMAL HIGH (ref 36.0–46.0)
Hemoglobin: 15.2 g/dL — ABNORMAL HIGH (ref 12.0–15.0)
Immature Granulocytes: 0 %
Lymphocytes Relative: 25 %
Lymphs Abs: 2.3 10*3/uL (ref 0.7–4.0)
MCH: 28.5 pg (ref 26.0–34.0)
MCHC: 32.4 g/dL (ref 30.0–36.0)
MCV: 87.8 fL (ref 80.0–100.0)
Monocytes Absolute: 0.6 10*3/uL (ref 0.1–1.0)
Monocytes Relative: 6 %
Neutro Abs: 6.2 10*3/uL (ref 1.7–7.7)
Neutrophils Relative %: 67 %
Platelets: 348 10*3/uL (ref 150–400)
RBC: 5.34 MIL/uL — ABNORMAL HIGH (ref 3.87–5.11)
RDW: 11.9 % (ref 11.5–15.5)
WBC: 9.2 10*3/uL (ref 4.0–10.5)
nRBC: 0 % (ref 0.0–0.2)

## 2023-08-28 LAB — COMPREHENSIVE METABOLIC PANEL WITH GFR
ALT: 18 U/L (ref 0–44)
AST: 20 U/L (ref 15–41)
Albumin: 4.6 g/dL (ref 3.5–5.0)
Alkaline Phosphatase: 118 U/L (ref 38–126)
Anion gap: 15 (ref 5–15)
BUN: 9 mg/dL (ref 6–20)
CO2: 24 mmol/L (ref 22–32)
Calcium: 10.3 mg/dL (ref 8.9–10.3)
Chloride: 101 mmol/L (ref 98–111)
Creatinine, Ser: 0.64 mg/dL (ref 0.44–1.00)
GFR, Estimated: >60 mL/min (ref >60)
Glucose, Bld: 92 mg/dL (ref 70–99)
Potassium: 3.8 mmol/L (ref 3.5–5.1)
Sodium: 139 mmol/L (ref 135–145)
Total Bilirubin: 0.3 mg/dL (ref 0.0–1.2)
Total Protein: 8.5 g/dL — ABNORMAL HIGH (ref 6.5–8.1)

## 2023-08-28 LAB — HCG, SERUM, QUALITATIVE: Preg, Serum: NEGATIVE

## 2023-08-28 MED ORDER — GABAPENTIN 100 MG PO CAPS: 100.0000 mg | ORAL_CAPSULE | Freq: Every day | ORAL | 1 refills | Status: DC

## 2023-08-28 MED ORDER — PROCHLORPERAZINE MALEATE 10 MG PO TABS: 10.0000 mg | ORAL_TABLET | Freq: Two times a day (BID) | ORAL | 0 refills | Status: DC | PRN

## 2023-08-28 MED ORDER — PROCHLORPERAZINE MALEATE 10 MG PO TABS
10.0000 mg | ORAL_TABLET | Freq: Once | ORAL | Status: AC
  Administered 2023-08-28: 10 mg via ORAL
  Filled 2023-08-28: qty 1

## 2023-08-28 NOTE — ED Notes (Signed)
 Patient is being discharged from the Urgent Care and sent to the Emergency Department via POV . Per Ramonita Burow NP, patient is in need of higher level of care due to headache. Patient is aware and verbalizes understanding of plan of care.  Vitals:   08/28/23 1441  BP: 124/81  Pulse: 78  Resp: 16  Temp: 98 F (36.7 C)  SpO2: 98%

## 2023-08-28 NOTE — ED Provider Notes (Signed)
 [facility]    CSN: [id number] Arrival date & time: 08/28/23  1350      History   Chief Complaint No chief complaint on file.   HPI Monica Steele is a 38 y.o. female presents for headache.  Translation line used as patient speaks [place]  Patient has a past medical history of migraines, GERD, anxiety who presents for headache.  Patient reports 1-1/2-week of intermittent global headaches that she describes as an aching headache.  It is associated with nausea, dizziness with near syncope, blurry vision, fatigue, photophobia.  She denies any syncope, vomiting, neck pain.  She does have a history of migraines but states this is not consistent with her typical migraine presentation.  She currently rates her headache as a 5-6 out of 10 but she does state it is the worst headache of her life.  Denies thunderclap headache.  Denies first-degree relative with SAH.  She has been taking her migraine medicine and over-the-counter Tylenol  and ibuprofen  without improvement.  No other concerns at this time.  HPI  Past Medical History:  Diagnosis Date   Allergy    Anxiety    Dry eyes    Gallbladder polyp    GERD (gastroesophageal reflux disease)    H/O seasonal allergies    Palpitations     Patient Active Problem List   Diagnosis Date Noted   Mixed incontinence 01/16/2023   Bladder wall thickening 12/26/2022   Periumbilical abdominal pain 12/26/2022   Chest pain of uncertain etiology 12/06/2022   Palpitations 12/06/2022   Migraine 12/06/2022   Pain of left breast 08/07/2022   Abdominal pain, epigastric 09/27/2021   History of gestational diabetes mellitus 07/06/2021   PROM (premature rupture of membranes) 06/04/2021   Normal postpartum course 06/03/2021   Gestational diabetes 06/02/2021   Language barrier 06/02/2021   SVD (06/02/21) 06/02/2021   Gastroesophageal reflux disease without esophagitis 07/12/2020   Dysphagia 07/15/2019   Seasonal allergies 07/15/2019    Numbness of face 07/15/2019   Polycystic ovary syndrome 01/16/2019   Abnormal auditory perception of right ear 06/11/2017   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 Authorizing Provider  amitriptyline  (ELAVIL ) 10 MG tablet TAKE 1 TABLET BY MOUTH EVERYDAY AT BEDTIME 04/10/23   Kenney Peacemaker, MD  cyclobenzaprine  (FLEXERIL ) 5 MG tablet Take 5 mg by mouth at bedtime.    [provider]  esomeprazole  (NEXIUM ) 40 MG capsule TAKE 1 CAPSULE BY MOUTH TWICE A DAY 05/17/23   Kennedy-Smith, Colleen M, NP  fluticasone  (FLONASE ) 50 MCG/ACT nasal spray Place 2 sprays into both nostrils daily. 08/01/23   [name], [name],  NP-C  gabapentin  (NEURONTIN ) 100 MG capsule Take 1 capsule (100 mg total) by mouth at bedtime. 08/28/23   Ulysees Gander, DO  meloxicam  (MOBIC ) 15 MG tablet Take 1 tablet (15 mg total) by mouth daily. 04/24/23   Ulysees Gander, DO  rizatriptan  (MAXALT ) 10 MG tablet Take 1 tablet (10 mg total) by mouth as needed for migraine. May repeat in 2 hours if needed 04/01/23   Ward, Char Common, PA-C  Vibegron  (GEMTESA ) 75 MG TABS Take 1 tablet (75 mg total) by mouth daily. 02/13/23   Stoneking, Ponce Brisker., MD    Family History Family History  Problem Relation Age of Onset   Healthy Mother    Healthy Father    Colon cancer Neg Hx    Esophageal cancer Neg Hx    Prostate cancer  Neg Hx    Rectal cancer Neg Hx     Social History Social History   Tobacco Use   Smoking status: Never   Smokeless tobacco: Never  Vaping Use   Vaping status: Never Used  Substance Use Topics   Alcohol use: No   Drug use: No     Allergies   Gadolinium derivatives   Review of Systems Review of Systems  Constitutional:  Positive for fatigue.  Eyes:  Positive for photophobia.  Gastrointestinal:  Positive for nausea.  Neurological:  Positive for dizziness and headaches.     Physical Exam Triage Vital Signs ED Triage Vitals  Encounter Vitals Group     BP 08/28/23 1441 124/81     Systolic BP Percentile --      Diastolic BP Percentile --      Pulse Rate 08/28/23 1441 78     Resp 08/28/23 1441 16     Temp 08/28/23 1441 98 F (36.7 C)     Temp Source 08/28/23 1441 Oral     SpO2 08/28/23 1441 98 %     Weight --      Height --      Head Circumference --      Peak Flow --      Pain Score 08/28/23 1439 6     Pain Loc --      Pain Education --      Exclude from Growth Chart --    No data found.  Updated Vital Signs BP 124/81   Pulse 78   Temp 98 F (36.7 C) (Oral)   Resp 16   LMP 08/21/2023   SpO2 98%   Visual Acuity Right Eye Distance:   Left Eye Distance:   Bilateral Distance:    Right Eye Near:   Left Eye Near:    Bilateral Near:     Physical Exam Vitals and nursing note reviewed.  Constitutional:      General: She is not in acute distress.    Appearance: Normal appearance. She is not ill-appearing.  HENT:     Head: Normocephalic and atraumatic.  Eyes:     Extraocular Movements: Extraocular movements intact.     Conjunctiva/sclera: Conjunctivae normal.     Pupils: Pupils are equal, round, and reactive to light.  Cardiovascular:     Rate and Rhythm: Normal rate.  Pulmonary:     Effort: Pulmonary effort is normal.  Skin:    General: Skin is warm and dry.  Neurological:     General: No focal deficit present.     Mental Status: She is alert  and oriented to person, place, and time.     GCS: GCS  eye subscore is 4. GCS verbal subscore is 5. GCS motor subscore is 6.     Cranial Nerves: No facial asymmetry.     Motor: No weakness.     Coordination: Romberg sign negative. Finger-Nose-Finger Test normal.     Gait: Tandem walk normal.  Psychiatric:        Mood and Affect: Mood normal.        Behavior: Behavior normal.      UC Treatments / Results  Labs (all labs ordered are listed, but only abnormal results are displayed) Labs Reviewed - No data to display  EKG   Radiology No results found.  Procedures Procedures (including critical care time)  Medications Ordered in UC Medications - No data to display  Initial Impression / Assessment and Plan / UC Course  I have reviewed the triage vital signs and the nursing notes.  Pertinent labs & imaging results that were available during my care of the patient were reviewed by me and considered in my medical decision making (see chart for details).     I reviewed exam and symptoms with patient.  Discussed limitations and abilities of urgent care.  Patient presenting with intermittent headaches that she is rating is the worst headache of her life with associated dizziness, near syncope, photophobia, nausea.  Given this I did advise she go to the emergency room for further evaluation.  She declines EMS transport and will go POV to the emergency room.  She was instructed to pull over and call 911 for any worsening symptoms that occur in transit and patient verbalized understanding.   Final Clinical Impressions(s) / UC Diagnoses   Final diagnoses:  Acute nonintractable headache, unspecified headache type  Dizziness  Near syncope     Discharge Instructions      Please go to emergency room for further evaluation of your headache  ED Prescriptions   None    PDMP not reviewed this encounter.   Alleen Arbour, NP 08/28/23 1515

## 2023-08-28 NOTE — ED Provider Notes (Signed)
 [place] [facility]ENC[facility]PART[facility] AT [facility] Provider Note   CSN: [id number] Arrival date & time: 08/28/23  1535     History {Add pertinent medical, surgical, social history, OB history to HPI:1} Chief Complaint  Patient presents with  . Headache    [facility] Monica Steele is a 38 y.o. female.  HPI     38yo female with history of GERD, anxiety presents with concern for headache.   Headaches all over headache, described as aching Associated nausea, lightheadedness Light makes it worse, loud noises make it worse Has had headache before but used tylenol  and got better, but this time more than 1.5 weeks of symptoms with tylenol  not helping  When have headache close eyes, rest them, they feel heavy, when open eyes it makes everything blurry then it is better.  No double vision or missing pieces of vision.   Denies new numbness, weakness, difficulty talking or walking, visual changes or facial droop.  1 month ago had numbness left arm and leg and her physician referred her to neurologist, whole arm, leg from knee down--even 2-3 months numbness  No eye pain No difficulty talking or walking   5-6/10 No fam hx of MS, ruptured aneurysms   No syncope, vomiting, neck pain  [place] interpreter used    Past Medical History:  Diagnosis Date  . Allergy   . Anxiety   . Dry eyes   . Gallbladder polyp   . GERD (gastroesophageal reflux disease)   . H/O seasonal allergies   . Palpitations      Home Medications Prior to Admission medications   Medication Sig Start Date End Date Taking? Authorizing Provider  amitriptyline  (ELAVIL ) 10 MG tablet TAKE 1 TABLET BY MOUTH EVERYDAY AT BEDTIME 04/10/23   Kenney Peacemaker, MD  cyclobenzaprine  (FLEXERIL ) 5 MG tablet Take 5 mg by mouth at bedtime.    [provider]  esomeprazole  (NEXIUM ) 40 MG capsule TAKE 1 CAPSULE BY MOUTH TWICE A DAY 05/17/23   Kennedy-Smith, Colleen M, NP  fluticasone  (FLONASE ) 50 MCG/ACT nasal spray  Place 2 sprays into both nostrils daily. 08/01/23   [name], [name], NP-C  gabapentin  (NEURONTIN ) 100 MG capsule Take 1 capsule (100 mg total) by mouth at bedtime. 08/28/23   Ulysees Gander, DO  meloxicam  (MOBIC ) 15 MG tablet Take 1 tablet (15 mg total) by mouth daily. 04/24/23   Ulysees Gander, DO  rizatriptan  (MAXALT ) 10 MG tablet Take 1 tablet (10 mg total) by mouth as needed for migraine. May repeat in 2 hours if needed 04/01/23   Ward, Char Common, PA-C  Vibegron  (GEMTESA ) 75 MG TABS Take 1 tablet (75 mg total) by mouth daily. 02/13/23   Stoneking, Ponce Brisker., MD      Allergies    Gadolinium derivatives    Review of Systems   Review of Systems  Physical Exam Updated Vital Signs BP (!) 148/82   Pulse 81   Temp 98 F (36.7 C)   Resp 16   Ht 5\' 1"  (1.549 m)   Wt 73 kg   LMP 08/21/2023   SpO2 100%   BMI 30.42 kg/m  Physical Exam  ED Results / Procedures / Treatments   Labs (all labs ordered are listed, but only abnormal results are displayed) Labs Reviewed  COMPREHENSIVE METABOLIC PANEL WITH GFR - Abnormal; Notable for the following components:      Result Value   Total Protein 8.5 (*)    All other components within normal limits  CBC WITH  DIFFERENTIAL/PLATELET - Abnormal; Notable for the following components:   RBC 5.34 (*)    Hemoglobin 15.2 (*)    HCT 46.9 (*)    All other components within normal limits  HCG, SERUM, QUALITATIVE    EKG None  Radiology CT Head Wo Contrast Result Date: 08/28/2023 CLINICAL DATA:  Headache, increasing frequency or severity. EXAM: CT HEAD WITHOUT CONTRAST TECHNIQUE: Contiguous axial images were obtained from the base of the skull through the vertex without intravenous contrast. RADIATION DOSE REDUCTION: This exam was performed according to the departmental dose-optimization program which includes automated exposure control, adjustment of the mA and/or kV according to patient size and/or use of iterative reconstruction technique.  COMPARISON:  CT of the head dated November 15, 2021. FINDINGS: Brain: Normal. No evidence of hemorrhage, mass, cortical infarct or hydrocephalus. Vascular: Normal. Skull: Intact and unremarkable. Sinuses/Orbits: Minimum mucosal disease within the right maxillary sinus. The orbits appear normal. Other: None. IMPRESSION: Negative. Electronically Signed   By: Maribeth Shivers M.D.   On: 08/28/2023 16:44    Procedures Procedures  {Document cardiac monitor, telemetry assessment procedure when appropriate:1}  Medications Ordered in ED Medications - No data to display  ED Course/ Medical Decision Making/ A&P   {   Click here for ABCD2, HEART and other calculatorsREFRESH Note before signing :1}                              Medical Decision Making  ***  {Document critical care time when appropriate:1} {Document review of labs and clinical decision tools ie heart score, Chads2Vasc2 etc:1}  {Document your independent review of radiology images, and any outside records:1} {Document your discussion with family members, caretakers, and with consultants:1} {Document social determinants of health affecting pt's care:1} {Document your decision making why or why not admission, treatments were needed:1} Final Clinical Impression(s) / ED Diagnoses Final diagnoses:  None    Rx / DC Orders ED Discharge Orders     None

## 2023-08-28 NOTE — Discharge Instructions (Addendum)
Please go to emergency room for further evaluation of your headache

## 2023-08-28 NOTE — ED Triage Notes (Signed)
 C/o headaches x 1 week with light sensitivity and blurred vision. Seen at [facility] and referred here d/t "needing a CT"

## 2023-08-28 NOTE — Patient Instructions (Signed)
 TMJ HEP  Nerve conduction study left upper and lower Continue gabapentin  100 mg nighty  Follow up after nerve conduction study

## 2023-08-28 NOTE — ED Triage Notes (Signed)
 Pt c/o HA and blurred visionx1.5wks

## 2023-08-29 ENCOUNTER — Telehealth: Payer: Self-pay | Admitting: Diagnostic Neuroimaging

## 2023-08-29 NOTE — Progress Notes (Signed)
 Patient: Monica Steele Date of Birth: [birth year]  Reason for Visit: Follow up for ER visit for migraine History from: Patient Primary Neurologist: Penumalli   ASSESSMENT AND PLAN 38 y.o. year old female   Migraine with aura  - Patient desires complete work-up, very worried about having headache for 1 week.  CT head was negative in ER.  At patient's request, we will check MRI of the brain with and without contrast.  I sent Zofran , patient reportedly had vomiting after contrast in 2019. - Start Topamax  50 mg at bedtime, after 2 weeks increase to 50 mg twice a day - Stop Maxalt , try Imitrex 50 mg as needed - Tried and failed: No longer taking gabapentin  nor amitriptyline  prescribed for other indications - Next steps: Nurtec, Ubrelvy, CGRP - Not planning pregnancy, advised we do not recommend pregnancy on Topamax  - Follow-up with me in 6 months or sooner if needed  2.  Numbness and tingling of the left upper and lower extremity - Going on for months, has seen sports medicine MD, referred to our office for nerve conduction of the left upper and lower for further evaluation of the cause of neuropathy. Referral is already in   Meds ordered this encounter  Medications   SUMAtriptan (IMITREX) 50 MG tablet    Sig: Take 1 tablet (50 mg total) by mouth every 2 (two) hours as needed for migraine. May repeat in 2 hours if headache persists or recurs.    Dispense:  10 tablet    Refill:  3   topiramate  (TOPAMAX ) 50 MG tablet    Sig: Take 1 tablet (50 mg total) by mouth 2 (two) times daily.    Dispense:  60 tablet    Refill:  6   ondansetron  (ZOFRAN -ODT) 4 MG disintegrating tablet    Sig: Take 1 tablet (4 mg total) by mouth every 8 (eight) hours as needed for nausea or vomiting.    Dispense:  20 tablet    Refill:  0   Orders Placed This Encounter  Procedures   MR BRAIN W WO CONTRAST   HISTORY OF PRESENT ILLNESS: Today 08/30/23 Last seen October 2023 for migraine with aura.  Started  Topamax  working up to 50 mg twice a day, Maxalt  as needed. Lost to follow up. In the ER 2 days ago, CT head negative.   Here with [place] interpreter. Was told advised to come here for MRI. Describes generalized headache and dizziness for 1 week. No nausea, is sensitive to light and sound. Claims 6/10. Has been taking Tylenol , ER gave ger compazine  (hasn't taken). Tried home Maxalt . Went to the ER because her headache wouldn't go away with Tylenol  or Maxalt . Had aura of seeing spots.   Can't remember if she took Topamax  back in 2023 when Dr. Salli Crawley prescribed? Has no headache in 2024, apparently just came back 1 week ago.   Has numbness/tingling to left hand (elbow to entire hand) and left leg (mid shin and down) for months. Saw sports medicine doctor. Advised to get referral to our office for nerve conduction study for evaluation of neuropathy. Works as Advertising account planner.  Has tried meloxicam , prednisone .  Does not take prednisone .  Amitriptyline  on her list for GI reason, not taking.   Is not pregnant or planning to become pregnant.   HISTORY  12/21/21 Dr. Salli Crawley: 38 year old female here for evaluation of headaches. For past 1 month has had intermittent headaches lasting 20 to 30 minutes at a time, 3-4 times a  week, with right-sided pulsating and throbbing sensation. Sometimes associated with dizziness, spinning, blurred vision sensation. Sometimes associated with seeing spots. She has tried Tylenol  without relief. No prior similar headaches. Has had CT of the head which was unremarkable. No family history of migraine. No specific triggering or aggravating factors.   REVIEW OF SYSTEMS: Out of a complete 14 system review of symptoms, the patient complains only of the following symptoms, and all other reviewed systems are negative.  See HPI  ALLERGIES: Allergies  Allergen Reactions   Gadolinium Derivatives Nausea And Vomiting    Pt vomited after multihance  injection.     HOME  MEDICATIONS: Outpatient Medications Prior to Visit  Medication Sig Dispense Refill   amitriptyline  (ELAVIL ) 10 MG tablet TAKE 1 TABLET BY MOUTH EVERYDAY AT BEDTIME 90 tablet 3   cyclobenzaprine  (FLEXERIL ) 5 MG tablet Take 5 mg by mouth at bedtime.     esomeprazole  (NEXIUM ) 40 MG capsule TAKE 1 CAPSULE BY MOUTH TWICE A DAY 180 capsule 1   fluticasone  (FLONASE ) 50 MCG/ACT nasal spray Place 2 sprays into both nostrils daily. 16 g 1   gabapentin  (NEURONTIN ) 100 MG capsule Take 1 capsule (100 mg total) by mouth at bedtime. 30 capsule 1   meloxicam  (MOBIC ) 15 MG tablet Take 1 tablet (15 mg total) by mouth daily. 30 tablet 0   prochlorperazine  (COMPAZINE ) 10 MG tablet Take 1 tablet (10 mg total) by mouth 2 (two) times daily as needed for nausea or vomiting (headache, nausea or vomiting. Can take with benadryl ). 10 tablet 0   Vibegron  (GEMTESA ) 75 MG TABS Take 1 tablet (75 mg total) by mouth daily. 30 tablet 11   rizatriptan  (MAXALT ) 10 MG tablet Take 1 tablet (10 mg total) by mouth as needed for migraine. May repeat in 2 hours if needed 10 tablet 0   No facility-administered medications prior to visit.    PAST MEDICAL HISTORY: Past Medical History:  Diagnosis Date   Allergy    Anxiety    Dry eyes    Gallbladder polyp    GERD (gastroesophageal reflux disease)    H/O seasonal allergies    Palpitations     PAST SURGICAL HISTORY: Past Surgical History:  Procedure Laterality Date   CHOLECYSTECTOMY N/A 07/31/2022   Procedure: LAPAROSCOPIC CHOLECYSTECTOMY;  Surgeon: Anda Bamberg, MD;  Location: [facility];  Service: General;  Laterality: N/A;   COLONOSCOPY  2021   ESOPHAGOGASTRODUODENOSCOPY ENDOSCOPY  2021   UPPER GASTROINTESTINAL ENDOSCOPY      FAMILY HISTORY: Family History  Problem Relation Age of Onset   Healthy Mother    Healthy Father    Colon cancer Neg Hx    Esophageal cancer Neg Hx    Prostate cancer Neg Hx    Rectal cancer Neg Hx     SOCIAL HISTORY: Social History    Socioeconomic History   Marital status: Married    Spouse name: Not on file   Number of children: 2   Years of education: Not on file   Highest education level: 6th grade  Occupational History   Occupation: Nail Tech  Tobacco Use   Smoking status: Never   Smokeless tobacco: Never  Vaping Use   Vaping status: Never Used  Substance and Sexual Activity   Alcohol use: No   Drug use: No   Sexual activity: Not Currently    Birth control/protection: None  Other Topics Concern   Not on file  Social History Narrative   Lives home with husband, Elie [place],  and two children.  1 son and 1 daughter. 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  Education 8th grade.  Caffeine one [employer] daily.   Social Drivers of Corporate investment banker Strain: Low Risk  (03/24/2023)   Overall Financial Resource Strain (CARDIA)    Difficulty of Paying Living Expenses: Not very hard  Food Insecurity: Food Insecurity Present (03/24/2023)   Hunger Vital Sign    Worried About Running Out of Food in the Last Year: Often true    Ran Out of Food in the Last Year: Never true  Transportation Needs: No Transportation Needs (03/24/2023)   PRAPARE - Administrator, Civil Service (Medical): No    Lack of Transportation (Non-Medical): No  Physical Activity: Unknown (08/06/2022)   Exercise Vital Sign    Days of Exercise per Week: 0 days    Minutes of Exercise per Session: Not on file  Stress: No Stress Concern Present (08/06/2022)   [employer] of Occupational Health - Occupational Stress Questionnaire    Feeling of Stress : Not at all  Social Connections: Socially Isolated (03/24/2023)   Social Connection and Isolation Panel    Frequency of Communication with Friends and Family: Once a week    Frequency of Social Gatherings with Friends and Family: Once a week    Attends Religious Services: Never    Database administrator or Organizations: No    Attends Engineer, structural: Not on file     Marital Status: Married  Catering manager Violence: Not on file   PHYSICAL EXAM  Vitals:   08/30/23 0819  BP: 104/88  Pulse: 88  Resp: 15  SpO2: 97%  Weight: 158 lb 12.8 oz (72 kg)  Height: 5' 1 (1.549 m)   Body mass index is 30 kg/m.  Generalized: Well developed, in no acute distress  Neurological examination  Mentation: Alert oriented to time, place, history taking. Follows all commands speech and language fluent Via interpreter Cranial nerve II-XII: Pupils were equal round reactive to light. Extraocular movements were full, visual field were full on confrontational test. Facial sensation and strength were normal. Head turning and shoulder shrug  were normal and symmetric. Motor: The motor testing reveals 5 over 5 strength of all 4 extremities. Good symmetric motor tone is noted throughout.  Sensory: Sensory testing is intact to soft touch on all 4 extremities. No evidence of extinction is noted.  There is no deficit with pinprick or soft touch.  She does indicate tingling to the left elbow down to all fingers, the same left mid shin down the foot Coordination: Cerebellar testing reveals good finger-nose-finger and heel-to-shin bilaterally.  Gait and station: Gait is normal. Tandem gait is normal.  Able to walk on heels and tiptoe. Reflexes: Deep tendon reflexes are symmetric and normal bilaterally.   DIAGNOSTIC DATA (LABS, IMAGING, TESTING) - I reviewed patient records, labs, notes, testing and imaging myself where available.  Lab Results  Component Value Date   WBC 9.2 08/28/2023   HGB 15.2 (H) 08/28/2023   HCT 46.9 (H) 08/28/2023   MCV 87.8 08/28/2023   PLT 348 08/28/2023      Component Value Date/Time   NA 139 08/28/2023 2030   NA 139 12/23/2019 1208   K 3.8 08/28/2023 2030   CL 101 08/28/2023 2030   CO2 24 08/28/2023 2030   GLUCOSE 92 08/28/2023 2030   BUN 9 08/28/2023 2030   BUN 12 12/23/2019 1208   CREATININE 0.64  08/28/2023 2030   CALCIUM 10.3 08/28/2023  2030   PROT 8.5 (H) 08/28/2023 2030   PROT 7.3 12/23/2019 1208   ALBUMIN 4.6 08/28/2023 2030   ALBUMIN 4.5 12/23/2019 1208   AST 20 08/28/2023 2030   ALT 18 08/28/2023 2030   ALKPHOS 118 08/28/2023 2030   BILITOT 0.3 08/28/2023 2030   BILITOT 0.2 12/23/2019 1208   GFRNONAA >60 08/28/2023 2030   GFRAA 135 12/23/2019 1208   Lab Results  Component Value Date   CHOL 157 12/23/2019   HDL 41 12/23/2019   LDLCALC 98 12/23/2019   TRIG 96 12/23/2019   CHOLHDL 3.8 12/23/2019   Lab Results  Component Value Date   HGBA1C 5.4 06/30/2020   No results found for: [id number] Lab Results  Component Value Date   TSH 1.49 05/22/2023   Jeanmarie Millet, AGNP-C, DNP 08/30/2023, 9:05 AM [facility] [address] [place], [place] [address] [phone]

## 2023-08-29 NOTE — Telephone Encounter (Signed)
 Migraine f/u scheduled

## 2023-08-30 ENCOUNTER — Encounter: Payer: Self-pay | Admitting: Neurology

## 2023-08-30 ENCOUNTER — Ambulatory Visit (INDEPENDENT_AMBULATORY_CARE_PROVIDER_SITE_OTHER): Admitting: Neurology

## 2023-08-30 ENCOUNTER — Telehealth: Payer: Self-pay | Admitting: Neurology

## 2023-08-30 VITALS — BP 104/88 | HR 88 | Resp 15 | Ht 61.0 in | Wt 158.8 lb

## 2023-08-30 DIAGNOSIS — Z603 Acculturation difficulty: Secondary | ICD-10-CM | POA: Diagnosis not present

## 2023-08-30 DIAGNOSIS — Z758 Other problems related to medical facilities and other health care: Secondary | ICD-10-CM | POA: Diagnosis not present

## 2023-08-30 DIAGNOSIS — G43109 Migraine with aura, not intractable, without status migrainosus: Secondary | ICD-10-CM

## 2023-08-30 MED ORDER — TOPIRAMATE 50 MG PO TABS: 50.0000 mg | ORAL_TABLET | Freq: Two times a day (BID) | ORAL | 6 refills | Status: DC

## 2023-08-30 MED ORDER — SUMATRIPTAN SUCCINATE 50 MG PO TABS: 50.0000 mg | ORAL_TABLET | ORAL | 3 refills | Status: DC | PRN

## 2023-08-30 MED ORDER — ONDANSETRON 4 MG PO TBDP: 4.0000 mg | ORAL_TABLET | Freq: Three times a day (TID) | ORAL | 0 refills | Status: DC | PRN

## 2023-08-30 NOTE — Telephone Encounter (Signed)
 sent to GI they obtain [facility]: [id number] exp. 08/30/23-10/14/23  [phone]

## 2023-08-30 NOTE — Patient Instructions (Addendum)
 Check MRI brain at your request  Start Topamax  50 mg at bedtime for 2 weeks, then increase to 50 mg twice for migraine prevention  Stop Maxalt , try Imitrex for acute migraine treatment I will send in Zofran  to nausea   Meds ordered this encounter  Medications   SUMAtriptan (IMITREX) 50 MG tablet    Sig: Take 1 tablet (50 mg total) by mouth every 2 (two) hours as needed for migraine. May repeat in 2 hours if headache persists or recurs.    Dispense:  10 tablet    Refill:  3   topiramate  (TOPAMAX ) 50 MG tablet    Sig: Take 1 tablet (50 mg total) by mouth 2 (two) times daily.    Dispense:  60 tablet    Refill:  6   ondansetron  (ZOFRAN -ODT) 4 MG disintegrating tablet    Sig: Take 1 tablet (4 mg total) by mouth every 8 (eight) hours as needed for nausea or vomiting.    Dispense:  20 tablet    Refill:  0

## 2023-09-05 ENCOUNTER — Encounter: Payer: Self-pay | Admitting: Neurology

## 2023-09-09 ENCOUNTER — Other Ambulatory Visit: Payer: Self-pay | Admitting: Physician Assistant

## 2023-09-22 ENCOUNTER — Other Ambulatory Visit: Payer: Self-pay | Admitting: Family Medicine

## 2023-10-09 ENCOUNTER — Ambulatory Visit: Payer: Self-pay | Admitting: Neurology

## 2023-10-09 ENCOUNTER — Encounter: Payer: Self-pay | Admitting: Family Medicine

## 2023-10-09 ENCOUNTER — Ambulatory Visit (INDEPENDENT_AMBULATORY_CARE_PROVIDER_SITE_OTHER): Admitting: Family Medicine

## 2023-10-09 ENCOUNTER — Ambulatory Visit
Admission: RE | Admit: 2023-10-09 | Discharge: 2023-10-09 | Disposition: A | Discharge status: Home / Self Care | Source: Ambulatory Visit | Attending: Neurology | Admitting: Neurology

## 2023-10-09 VITALS — BP 108/76 | HR 92 | Temp 97.8°F | Ht 61.0 in | Wt 164.0 lb

## 2023-10-09 DIAGNOSIS — H9202 Otalgia, left ear: Secondary | ICD-10-CM | POA: Diagnosis not present

## 2023-10-09 DIAGNOSIS — R0789 Other chest pain: Secondary | ICD-10-CM | POA: Diagnosis not present

## 2023-10-09 DIAGNOSIS — M26621 Arthralgia of right temporomandibular joint: Secondary | ICD-10-CM

## 2023-10-09 DIAGNOSIS — Z758 Other problems related to medical facilities and other health care: Secondary | ICD-10-CM | POA: Diagnosis not present

## 2023-10-09 DIAGNOSIS — Z603 Acculturation difficulty: Secondary | ICD-10-CM | POA: Diagnosis not present

## 2023-10-09 DIAGNOSIS — R29898 Other symptoms and signs involving the musculoskeletal system: Secondary | ICD-10-CM | POA: Diagnosis not present

## 2023-10-09 DIAGNOSIS — G43109 Migraine with aura, not intractable, without status migrainosus: Secondary | ICD-10-CM | POA: Diagnosis not present

## 2023-10-09 MED ORDER — GADOPICLENOL 0.5 MMOL/ML IV SOLN
7.0000 mL | Freq: Once | INTRAVENOUS | Status: AC | PRN
  Administered 2023-10-09: 7 mL via INTRAVENOUS

## 2023-10-09 NOTE — Patient Instructions (Signed)
 Take meloxicam  for jaw and chest wall pain.   You can also try gabapentin  for the jaw pain.   You may take the muscle relaxant as needed for severe pain at bedtime.

## 2023-10-09 NOTE — Progress Notes (Signed)
 Subjective:     Patient ID: Monica Steele, female    DOB: 08-27-1985, 38 y.o.   MRN: [id number]  Chief Complaint  Patient presents with   Medical Management of Chronic Issues    Check up on chest pain on breathing in on left side, has been going on last couple of days. Stinging pain, comes and goes  Saw bone dr due to jaw hurting when eating. When opening jaw wide she hears click noise, bone dr told her to go to pcp so she can be referred to the right dr    HPI  History of Present Illness         Medical interpreter on stick used. She speaks vietnamese.   C/o left sided chest wall pain that feels like a stinging sensation with breathing x 2-3 weeks.  Mild cough. No shortness of breath. Pain with movement.    Right sided jaw pain with chewing x 2 years. Clicking when opening mouth wide. She saw sports medicine. She was previously taking meloxicam  and oral steroids and told to do HEP for TMJ. She also reports having right ear pain when opening her mouth wide.  She does not have a dentist.    LMP: 09/20/2023     Health Maintenance Due  Topic Date Due   Hepatitis B Vaccines (1 of 3 - 19+ 3-dose series) Never done   HPV VACCINES (2 - 3-dose SCDM series) 09/09/2020   COVID-19 Vaccine (3 - 2024-25 season) 11/19/2022    Past Medical History:  Diagnosis Date   Allergy    Anxiety    Dry eyes    Gallbladder polyp    GERD (gastroesophageal reflux disease)    H/O seasonal allergies    Palpitations     Past Surgical History:  Procedure Laterality Date   CHOLECYSTECTOMY N/A 07/31/2022   Procedure: LAPAROSCOPIC CHOLECYSTECTOMY;  Surgeon: Paola Dreama SAILOR, MD;  Location: [facility];  Service: General;  Laterality: N/A;   COLONOSCOPY  2021   ESOPHAGOGASTRODUODENOSCOPY ENDOSCOPY  2021   UPPER GASTROINTESTINAL ENDOSCOPY      Family History  Problem Relation Age of Onset   Healthy Mother    Healthy Father    Colon cancer Neg Hx    Esophageal cancer Neg Hx    Prostate  cancer Neg Hx    Rectal cancer Neg Hx     Social History   Socioeconomic History   Marital status: Married    Spouse name: Not on file   Number of children: 2   Years of education: Not on file   Highest education level: 6th grade  Occupational History   Occupation: Nail Tech  Tobacco Use   Smoking status: Never   Smokeless tobacco: Never  Vaping Use   Vaping status: Never Used  Substance and Sexual Activity   Alcohol use: No   Drug use: No   Sexual activity: Not Currently    Birth control/protection: None  Other Topics Concern   Not on file  Social History Narrative   Lives home with husband, Monica Steele, and two children.  1 son and 1 daughter. 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  Education 8th grade.  Caffeine one [employer] daily.   Social Drivers of Corporate investment banker Strain: Low Risk  (10/07/2023)   Overall Financial Resource Strain (CARDIA)    Difficulty of Paying Living Expenses: Not hard at all  Food Insecurity: No Food Insecurity (10/07/2023)   Hunger Vital Sign  Worried About Programme researcher, broadcasting/film/video in the Last Year: Never true    Ran Out of Food in the Last Year: Never true  Transportation Needs: No Transportation Needs (10/07/2023)   PRAPARE - Administrator, Civil Service (Medical): No    Lack of Transportation (Non-Medical): No  Physical Activity: Unknown (08/06/2022)   Exercise Vital Sign    Days of Exercise per Week: 0 days    Minutes of Exercise per Session: Not on file  Stress: No Stress Concern Present (08/06/2022)   [employer] of Occupational Health - Occupational Stress Questionnaire    Feeling of Stress : Not at all  Social Connections: Moderately Isolated (10/07/2023)   Social Connection and Isolation Panel    Frequency of Communication with Friends and Family: More than three times a week    Frequency of Social Gatherings with Friends and Family: Once a week    Attends Religious Services: Never    Database administrator or  Organizations: No    Attends Engineer, structural: Not on file    Marital Status: Married  Catering manager Violence: Not on file    Outpatient Medications Prior to Visit  Medication Sig Dispense Refill   SUMAtriptan  (IMITREX ) 50 MG tablet Take 1 tablet (50 mg total) by mouth every 2 (two) hours as needed for migraine. May repeat in 2 hours if headache persists or recurs. 10 tablet 3   amitriptyline  (ELAVIL ) 10 MG tablet TAKE 1 TABLET BY MOUTH EVERYDAY AT BEDTIME (Patient not taking: Reported on 10/09/2023) 90 tablet 3   cyclobenzaprine  (FLEXERIL ) 5 MG tablet Take 5 mg by mouth at bedtime. (Patient not taking: Reported on 10/09/2023)     esomeprazole  (NEXIUM ) 40 MG capsule TAKE 1 CAPSULE BY MOUTH TWICE A DAY (Patient not taking: Reported on 10/09/2023) 180 capsule 1   fluticasone  (FLONASE ) 50 MCG/ACT nasal spray SPRAY 2 SPRAYS INTO EACH NOSTRIL EVERY DAY (Patient not taking: Reported on 10/09/2023) 16 mL 1   gabapentin  (NEURONTIN ) 100 MG capsule Take 1 capsule (100 mg total) by mouth at bedtime. (Patient not taking: Reported on 10/09/2023) 30 capsule 1   meloxicam  (MOBIC ) 15 MG tablet Take 1 tablet (15 mg total) by mouth daily. (Patient not taking: Reported on 10/09/2023) 30 tablet 0   ondansetron  (ZOFRAN -ODT) 4 MG disintegrating tablet Take 1 tablet (4 mg total) by mouth every 8 (eight) hours as needed for nausea or vomiting. (Patient not taking: Reported on 10/09/2023) 20 tablet 0   prochlorperazine  (COMPAZINE ) 10 MG tablet Take 1 tablet (10 mg total) by mouth 2 (two) times daily as needed for nausea or vomiting (headache, nausea or vomiting. Can take with benadryl ). (Patient not taking: Reported on 10/09/2023) 10 tablet 0   topiramate  (TOPAMAX ) 50 MG tablet Take 1 tablet (50 mg total) by mouth 2 (two) times daily. (Patient not taking: Reported on 10/09/2023) 60 tablet 6   Vibegron  (GEMTESA ) 75 MG TABS Take 1 tablet (75 mg total) by mouth daily. (Patient not taking: Reported on 10/09/2023) 30  tablet 11   No facility-administered medications prior to visit.    Allergies  Allergen Reactions   Gadolinium Derivatives Nausea And Vomiting    Pt vomited after multihance  injection.     Review of Systems  Constitutional:  Negative for chills, fever, malaise/fatigue and weight loss.  HENT:  Positive for ear pain. Negative for ear discharge, hearing loss, sinus pain, sore throat and tinnitus.        Right TMJ pain  with clicking when opening mouth wide   Eyes:  Negative for blurred vision, double vision and photophobia.  Respiratory:  Negative for cough, sputum production, shortness of breath and wheezing.   Cardiovascular:  Negative for chest pain, palpitations and leg swelling.       Chest wall stinging on left side with movement and deep breaths  Gastrointestinal:  Negative for abdominal pain, constipation, diarrhea, nausea and vomiting.  Genitourinary:  Negative for dysuria, frequency and urgency.  Musculoskeletal:  Negative for back pain, falls and neck pain.  Skin:  Negative for rash.  Neurological:  Negative for dizziness, weakness and headaches.       Objective:    Physical Exam Constitutional:      General: She is not in acute distress.    Appearance: She is not ill-appearing.  HENT:     Head:     Jaw: Tenderness and pain on movement present.     Comments: Right TMJ tenderness and clicking with pain into right ear     Right Ear: Tympanic membrane, ear canal and external ear normal. No mastoid tenderness.     Left Ear: Tympanic membrane, ear canal and external ear normal.     Nose: Nose normal.     Mouth/Throat:     Mouth: Mucous membranes are moist.     Pharynx: Oropharynx is clear.  Eyes:     Extraocular Movements: Extraocular movements intact.     Conjunctiva/sclera: Conjunctivae normal.  Cardiovascular:     Rate and Rhythm: Normal rate and regular rhythm.  Pulmonary:     Effort: Pulmonary effort is normal.     Breath sounds: Normal breath sounds.  Chest:      Chest wall: Tenderness present. No swelling or crepitus.       Comments: Focal TTP of left upper chest wall left of sternum  Musculoskeletal:     Cervical back: Normal range of motion and neck supple. No tenderness.  Lymphadenopathy:     Cervical: No cervical adenopathy.  Skin:    General: Skin is warm and dry.  Neurological:     General: No focal deficit present.     Mental Status: She is alert and oriented to person, place, and time.  Psychiatric:        Mood and Affect: Mood normal.        Behavior: Behavior normal.        Thought Content: Thought content normal.      BP 108/76   Pulse 92   Temp 97.8 F (36.6 C) (Temporal)   Ht 5' 1 (1.549 m)   Wt 164 lb (74.4 kg)   SpO2 99%   BMI 30.99 kg/m  Wt Readings from Last 3 Encounters:  10/09/23 164 lb (74.4 kg)  08/30/23 158 lb 12.8 oz (72 kg)  08/28/23 161 lb (73 kg)       Assessment & Plan:   Problem List Items Addressed This Visit     Language barrier   Other Visit Diagnoses       Arthralgia of right temporomandibular joint    -  Primary   Relevant Orders   Ambulatory referral to ENT     Jaw clicking       Relevant Orders   Ambulatory referral to ENT     Chest wall tenderness         Otalgia, left       Relevant Orders   Ambulatory referral to ENT      Medical interpreter  on stick used for the entire visit.  TMJ pain- chronic and worsening. She has taken NSAIDs and oral steroids in the past without relief. States she has seen a dentist in the past. Denies grinding teeth or gum chewing. Referral to ENT for further evaluation and treatment.  Chest wall pain reproduced with certain movements and TTP along left upper sternal border. No sign of any cardiopulmonary symptoms.  Restart meloxicam  and gabapentin  prn. Take Flexeril  she has at home prn for severe pain at bedtime only.   I am having Monica Steele maintain her Gemtesa , amitriptyline , meloxicam , esomeprazole , gabapentin , cyclobenzaprine ,  prochlorperazine , SUMAtriptan , topiramate , ondansetron , and fluticasone .  No orders of the defined types were placed in this encounter.

## 2023-10-10 ENCOUNTER — Ambulatory Visit: Admitting: Radiology

## 2023-10-24 ENCOUNTER — Ambulatory Visit: Admitting: Radiology

## 2023-11-04 ENCOUNTER — Ambulatory Visit (INDEPENDENT_AMBULATORY_CARE_PROVIDER_SITE_OTHER)

## 2023-11-04 ENCOUNTER — Ambulatory Visit
Admission: EM | Admit: 2023-11-04 | Discharge: 2023-11-04 | Disposition: A | Discharge status: Home / Self Care | Attending: Family Medicine | Admitting: Family Medicine

## 2023-11-04 DIAGNOSIS — J310 Chronic rhinitis: Secondary | ICD-10-CM

## 2023-11-04 DIAGNOSIS — J018 Other acute sinusitis: Secondary | ICD-10-CM | POA: Diagnosis not present

## 2023-11-04 DIAGNOSIS — R053 Chronic cough: Secondary | ICD-10-CM

## 2023-11-04 DIAGNOSIS — R059 Cough, unspecified: Secondary | ICD-10-CM | POA: Diagnosis not present

## 2023-11-04 MED ORDER — AMOXICILLIN-POT CLAVULANATE 875-125 MG PO TABS: 1.0000 | ORAL_TABLET | Freq: Two times a day (BID) | ORAL | 0 refills | Status: DC

## 2023-11-04 MED ORDER — CETIRIZINE HCL 10 MG PO TABS: 10.0000 mg | ORAL_TABLET | Freq: Every day | ORAL | 0 refills | Status: AC

## 2023-11-04 MED ORDER — PREDNISONE 20 MG PO TABS: ORAL_TABLET | ORAL | 0 refills | Status: DC

## 2023-11-04 NOTE — ED Triage Notes (Signed)
 Through video interpreter/Vietnamese-pt c/o dry cough, yellow nasal congestion,SHOB, pain to left side of chest and left side of back with deep breaths x 6 days-states she was dx with PNA and completed abx 1-2 months ago-states she feels same as when she was dx with PNA-taking tylenol  w/o relief-NAD-steady gait

## 2023-11-04 NOTE — ED Provider Notes (Signed)
 [facility]  Note:  This document was prepared using Conservation officer, historic buildings and may include unintentional dictation errors.  MRN: [id number] DOB: [birth year]  Subjective:   Monica Steele is a 38 y.o. female presenting for 1 to 77-month history of persistent chronic sinus congestion, sinus inflammation, sinus pain worse in the past week.  Has started to have more persistent coughing, throat pain, facial pain.  Has felt fatigued.  Reports that she was previously treated for pneumonia and it feels similar to this.  No history of asthma.  Does not take allergy medications consistently but has a diagnosis for this. No smoking of any kind including cigarettes, cigars, vaping, marijuana use.    No current facility-administered medications for this encounter.  Current Outpatient Medications:    amitriptyline  (ELAVIL ) 10 MG tablet, TAKE 1 TABLET BY MOUTH EVERYDAY AT BEDTIME (Patient not taking: Reported on 10/09/2023), Disp: 90 tablet, Rfl: 3   cyclobenzaprine  (FLEXERIL ) 5 MG tablet, Take 5 mg by mouth at bedtime. (Patient not taking: Reported on 10/09/2023), Disp: , Rfl:    esomeprazole  (NEXIUM ) 40 MG capsule, TAKE 1 CAPSULE BY MOUTH TWICE A DAY (Patient not taking: Reported on 10/09/2023), Disp: 180 capsule, Rfl: 1   fluticasone  (FLONASE ) 50 MCG/ACT nasal spray, SPRAY 2 SPRAYS INTO EACH NOSTRIL EVERY DAY (Patient not taking: Reported on 10/09/2023), Disp: 16 mL, Rfl: 1   gabapentin  (NEURONTIN ) 100 MG capsule, Take 1 capsule (100 mg total) by mouth at bedtime. (Patient not taking: Reported on 10/09/2023), Disp: 30 capsule, Rfl: 1   meloxicam  (MOBIC ) 15 MG tablet, Take 1 tablet (15 mg total) by mouth daily. (Patient not taking: Reported on 10/09/2023), Disp: 30 tablet, Rfl: 0   ondansetron  (ZOFRAN -ODT) 4 MG disintegrating tablet, Take 1 tablet (4 mg total) by mouth every 8 (eight) hours as needed for nausea or vomiting. (Patient not taking: Reported on 10/09/2023), Disp:  20 tablet, Rfl: 0   prochlorperazine  (COMPAZINE ) 10 MG tablet, Take 1 tablet (10 mg total) by mouth 2 (two) times daily as needed for nausea or vomiting (headache, nausea or vomiting. Can take with benadryl ). (Patient not taking: Reported on 10/09/2023), Disp: 10 tablet, Rfl: 0   SUMAtriptan  (IMITREX ) 50 MG tablet, Take 1 tablet (50 mg total) by mouth every 2 (two) hours as needed for migraine. May repeat in 2 hours if headache persists or recurs., Disp: 10 tablet, Rfl: 3   topiramate  (TOPAMAX ) 50 MG tablet, Take 1 tablet (50 mg total) by mouth 2 (two) times daily. (Patient not taking: Reported on 10/09/2023), Disp: 60 tablet, Rfl: 6   Vibegron  (GEMTESA ) 75 MG TABS, Take 1 tablet (75 mg total) by mouth daily. (Patient not taking: Reported on 10/09/2023), Disp: 30 tablet, Rfl: 11   Allergies  Allergen Reactions   Gadolinium Derivatives Nausea And Vomiting    Pt vomited after multihance  injection.     Past Medical History:  Diagnosis Date   Allergy    Anxiety    Dry eyes    Gallbladder polyp    GERD (gastroesophageal reflux disease)    H/O seasonal allergies    Palpitations      Past Surgical History:  Procedure Laterality Date   CHOLECYSTECTOMY N/A 07/31/2022   Procedure: LAPAROSCOPIC CHOLECYSTECTOMY;  Surgeon: Paola Dreama SAILOR, MD;  Location: [facility];  Service: General;  Laterality: N/A;   COLONOSCOPY  2021   ESOPHAGOGASTRODUODENOSCOPY ENDOSCOPY  2021   UPPER GASTROINTESTINAL ENDOSCOPY      Family History  Problem  Relation Age of Onset   Healthy Mother    Healthy Father    Colon cancer Neg Hx    Esophageal cancer Neg Hx    Prostate cancer Neg Hx    Rectal cancer Neg Hx     Social History   Tobacco Use   Smoking status: Never   Smokeless tobacco: Never  Vaping Use   Vaping status: Never Used  Substance Use Topics   Alcohol use: No   Drug use: No    ROS   Objective:   Vitals: BP 122/78 (BP Location: Right Arm)   Pulse 85   Temp 98.1 F (36.7 C) (Oral)   Resp  16   LMP 10/19/2023   SpO2 97%   Physical Exam Constitutional:      General: She is not in acute distress.    Appearance: Normal appearance. She is well-developed and normal weight. She is not ill-appearing, toxic-appearing or diaphoretic.  HENT:     Head: Normocephalic and atraumatic.     Right Ear: Ear canal and external ear normal. No drainage or tenderness. No middle ear effusion. There is no impacted cerumen. Tympanic membrane is not erythematous or bulging.     Left Ear: Ear canal and external ear normal. No drainage or tenderness.  No middle ear effusion. There is no impacted cerumen. Tympanic membrane is not erythematous or bulging.     Ears:     Comments: Effusions bilaterally. No erythema, bulging.     Nose: Congestion present. No rhinorrhea.     Mouth/Throat:     Mouth: Mucous membranes are moist. No oral lesions.     Pharynx: No pharyngeal swelling, oropharyngeal exudate, posterior oropharyngeal erythema or uvula swelling.     Tonsils: No tonsillar exudate or tonsillar abscesses. 0 on the right. 0 on the left.     Comments: Cobblestone pattern post-nasal drainage.  Eyes:     General: No scleral icterus.       Right eye: No discharge.        Left eye: No discharge.     Extraocular Movements: Extraocular movements intact.     Right eye: Normal extraocular motion.     Left eye: Normal extraocular motion.     Conjunctiva/sclera: Conjunctivae normal.  Cardiovascular:     Rate and Rhythm: Normal rate and regular rhythm.     Heart sounds: Normal heart sounds. No murmur heard.    No friction rub. No gallop.  Pulmonary:     Effort: Pulmonary effort is normal. No respiratory distress.     Breath sounds: No stridor. No wheezing, rhonchi or rales.  Chest:     Chest wall: No tenderness.  Musculoskeletal:     Cervical back: Normal range of motion and neck supple.  Lymphadenopathy:     Cervical: No cervical adenopathy.  Skin:    General: Skin is warm and dry.  Neurological:      General: No focal deficit present.     Mental Status: She is alert and oriented to person, place, and time.  Psychiatric:        Mood and Affect: Mood normal.        Behavior: Behavior normal.     Assessment and Plan :   PDMP not reviewed this encounter.  1. Acute non-recurrent sinusitis of other sinus   2. Persistent cough   3. Chronic rhinitis    Start prednisone  course for acute on chronic rhinitis, sinusitis.  Recommend Augmentin  for secondary infection.  Start Zyrtec  daily.  X-ray over-read was pending at time of discharge, recommended follow up with only abnormal results. Otherwise will not call for negative over-read. Patient was in agreement.  Adding azithromycin should she have pneumonia.  I did not appreciate any of this on my personal read and therefore we will hold off.  Counseled patient on potential for adverse effects with medications prescribed/recommended today, ER and return-to-clinic precautions discussed, patient verbalized understanding.    Christopher Savannah, [place] 11/04/23 [id number]

## 2023-11-04 NOTE — Discharge Instructions (Addendum)
 Start prednisone  to help with your sinus inflammation. This is a 5 day course. Start taking Zyrtec  daily to help control your sinuses, chronic rhinitis. Take this medication every day. Start an antibiotic, Augmentin , to help with you with a secondary sinus infection.

## 2023-11-07 ENCOUNTER — Ambulatory Visit: Payer: Self-pay | Admitting: Urgent Care

## 2023-11-07 ENCOUNTER — Ambulatory Visit (INDEPENDENT_AMBULATORY_CARE_PROVIDER_SITE_OTHER): Admitting: Neurology

## 2023-11-07 ENCOUNTER — Encounter: Payer: Self-pay | Admitting: Neurology

## 2023-11-07 VITALS — BP 108/84 | Resp 16 | Ht 61.0 in | Wt 161.0 lb

## 2023-11-07 DIAGNOSIS — G5602 Carpal tunnel syndrome, left upper limb: Secondary | ICD-10-CM | POA: Diagnosis not present

## 2023-11-07 DIAGNOSIS — G8929 Other chronic pain: Secondary | ICD-10-CM | POA: Diagnosis not present

## 2023-11-07 DIAGNOSIS — R1084 Generalized abdominal pain: Secondary | ICD-10-CM

## 2023-11-07 DIAGNOSIS — G43109 Migraine with aura, not intractable, without status migrainosus: Secondary | ICD-10-CM

## 2023-11-07 MED ORDER — SUMATRIPTAN SUCCINATE 50 MG PO TABS: 50.0000 mg | ORAL_TABLET | ORAL | 11 refills | Status: DC | PRN

## 2023-11-07 MED ORDER — AMITRIPTYLINE HCL 10 MG PO TABS: ORAL_TABLET | ORAL | 3 refills | Status: AC

## 2023-11-07 NOTE — Progress Notes (Signed)
 Chief Complaint  Patient presents with   ncs emg    Emgrm4, interpreter present, /internal referral req NCS/EMG Numbness and tingling of left upper and lower extremity      ASSESSMENT AND PLAN  Monica Steele is a 38 y.o. female   Mild left carpal tunnel syndrome  Suggest wrist splint, Chronic migraine headache  There is a component of medical rebound headache due to frequent over-the-counter medication use  Restart Elavil  10 mg every night as preventive medication  Imitrex  as needed for moderate to severe headaches   DIAGNOSTIC DATA (LABS, IMAGING, TESTING) - I reviewed patient records, labs, notes, testing and imaging myself where available.   MEDICAL HISTORY:  Monica Steele is a 38 year old right handed female, seen in request by nurse practitioner Sarah for electrodiagnostic study for evaluation of her left hand paresthesia, history is via [place] interpreter  History is obtained from the patient and review of electronic medical records. I personally reviewed pertinent available imaging films in PACS.   PMHx of   She has been a patient of our clinic for few years for headaches, she works at [employer], has constant exposure to strong smells, bright light, her headache tends to stay on the right side, pressure, mild degree on a daily basis, couple times each week, it will exacerbate to a more severe headache requiring medications, she tends to take frequent Tylenol  ibuprofen   Reviewed her chart, previously tried gabapentin , Elavil , Topamax , somehow she is no longer taking it,  Left hand paresthesia has been going on for 2 months, mainly involving left fingers, also intermittent numbness of left lower extremity, no weakness,  MRI of brain with and without contrast July 2025 was normal  Labs showed normal TSH ferritin CMP CBC EMG nerve conduction study today confirmed mild left carpal tunnel syndromes,   PHYSICAL EXAM:   Vitals:   11/07/23 0827  BP:  108/84  Resp: 16  Weight: 161 lb (73 kg)  Height: 5' 1 (1.549 m)   Body mass index is 30.42 kg/m.  PHYSICAL EXAMNIATION:  Gen: NAD, conversant, well nourised, well groomed                     Cardiovascular: Regular rate rhythm, no peripheral edema, warm, nontender. Eyes: Conjunctivae clear without exudates or hemorrhage Neck: Supple, no carotid bruits. Pulmonary: Clear to auscultation bilaterally   NEUROLOGICAL EXAM:  MENTAL STATUS: Speech/cognition: Awake, alert, oriented to history taking and casual conversation CRANIAL NERVES: CN II: Visual fields are full to confrontation. Pupils are round equal and briskly reactive to light. CN III, IV, VI: extraocular movement are normal. No ptosis. CN V: Facial sensation is intact to light touch CN VII: Face is symmetric with normal eye closure  CN VIII: Hearing is normal to causal conversation. CN IX, X: Phonation is normal. CN XI: Head turning and shoulder shrug are intact  MOTOR: There is no pronator drift of out-stretched arms. Muscle bulk and tone are normal. Muscle strength is normal.  REFLEXES: Reflexes are 2+ and symmetric at the biceps, triceps, knees, and ankles. Plantar responses are flexor.  SENSORY: Intact to light touch, pinprick and vibratory sensation are intact in fingers and toes.  COORDINATION: There is no trunk or limb dysmetria noted.  GAIT/STANCE: Posture is normal. Gait is steady    REVIEW OF SYSTEMS:  Full 14 system review of systems performed and notable only for as above All other review of systems were negative.   ALLERGIES:  Allergies  Allergen Reactions   Gadolinium Derivatives Nausea And Vomiting    Pt vomited after multihance  injection.     HOME MEDICATIONS: Current Outpatient Medications  Medication Sig Dispense Refill   amitriptyline  (ELAVIL ) 10 MG tablet TAKE 1 TABLET BY MOUTH EVERYDAY AT BEDTIME (Patient not taking: Reported on 10/09/2023) 90 tablet 3   amoxicillin -clavulanate  (AUGMENTIN ) 875-125 MG tablet Take 1 tablet by mouth 2 (two) times daily. 20 tablet 0   cetirizine  (ZYRTEC  ALLERGY) 10 MG tablet Take 1 tablet (10 mg total) by mouth daily. 90 tablet 0   cyclobenzaprine  (FLEXERIL ) 5 MG tablet Take 5 mg by mouth at bedtime. (Patient not taking: Reported on 10/09/2023)     esomeprazole  (NEXIUM ) 40 MG capsule TAKE 1 CAPSULE BY MOUTH TWICE A DAY (Patient not taking: Reported on 10/09/2023) 180 capsule 1   fluticasone  (FLONASE ) 50 MCG/ACT nasal spray SPRAY 2 SPRAYS INTO EACH NOSTRIL EVERY DAY (Patient not taking: Reported on 10/09/2023) 16 mL 1   gabapentin  (NEURONTIN ) 100 MG capsule Take 1 capsule (100 mg total) by mouth at bedtime. (Patient not taking: Reported on 10/09/2023) 30 capsule 1   meloxicam  (MOBIC ) 15 MG tablet Take 1 tablet (15 mg total) by mouth daily. (Patient not taking: Reported on 10/09/2023) 30 tablet 0   ondansetron  (ZOFRAN -ODT) 4 MG disintegrating tablet Take 1 tablet (4 mg total) by mouth every 8 (eight) hours as needed for nausea or vomiting. (Patient not taking: Reported on 10/09/2023) 20 tablet 0   predniSONE  (DELTASONE ) 20 MG tablet Take 2 tablets daily with breakfast. 10 tablet 0   prochlorperazine  (COMPAZINE ) 10 MG tablet Take 1 tablet (10 mg total) by mouth 2 (two) times daily as needed for nausea or vomiting (headache, nausea or vomiting. Can take with benadryl ). (Patient not taking: Reported on 10/09/2023) 10 tablet 0   SUMAtriptan  (IMITREX ) 50 MG tablet Take 1 tablet (50 mg total) by mouth every 2 (two) hours as needed for migraine. May repeat in 2 hours if headache persists or recurs. 10 tablet 3   topiramate  (TOPAMAX ) 50 MG tablet Take 1 tablet (50 mg total) by mouth 2 (two) times daily. (Patient not taking: Reported on 10/09/2023) 60 tablet 6   Vibegron  (GEMTESA ) 75 MG TABS Take 1 tablet (75 mg total) by mouth daily. (Patient not taking: Reported on 10/09/2023) 30 tablet 11   No current facility-administered medications for this visit.    PAST  MEDICAL HISTORY: Past Medical History:  Diagnosis Date   Allergy    Anxiety    Dry eyes    Gallbladder polyp    GERD (gastroesophageal reflux disease)    H/O seasonal allergies    Palpitations     PAST SURGICAL HISTORY: Past Surgical History:  Procedure Laterality Date   CHOLECYSTECTOMY N/A 07/31/2022   Procedure: LAPAROSCOPIC CHOLECYSTECTOMY;  Surgeon: Paola Dreama SAILOR, MD;  Location: [facility];  Service: General;  Laterality: N/A;   COLONOSCOPY  2021   ESOPHAGOGASTRODUODENOSCOPY ENDOSCOPY  2021   UPPER GASTROINTESTINAL ENDOSCOPY      FAMILY HISTORY: Family History  Problem Relation Age of Onset   Healthy Mother    Healthy Father    Colon cancer Neg Hx    Esophageal cancer Neg Hx    Prostate cancer Neg Hx    Rectal cancer Neg Hx     SOCIAL HISTORY: Social History   Socioeconomic History   Marital status: Married    Spouse name: Not on file   Number of children: 2   Years of  education: Not on file   Highest education level: 6th grade  Occupational History   Occupation: Nail Tech  Tobacco Use   Smoking status: Never   Smokeless tobacco: Never  Vaping Use   Vaping status: Never Used  Substance and Sexual Activity   Alcohol use: No   Drug use: No   Sexual activity: Not Currently    Birth control/protection: None  Other Topics Concern   Not on file  Social History Narrative   Lives home with husband, Marianne Daniels, and two children.  1 son and 1 daughter. 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  Education 8th grade.  Caffeine one [employer] daily.   Social Drivers of Corporate investment banker Strain: Low Risk  (10/07/2023)   Overall Financial Resource Strain (CARDIA)    Difficulty of Paying Living Expenses: Not hard at all  Food Insecurity: No Food Insecurity (10/07/2023)   Hunger Vital Sign    Worried About Running Out of Food in the Last Year: Never true    Ran Out of Food in the Last Year: Never true  Transportation Needs: No Transportation Needs (10/07/2023)    PRAPARE - Administrator, Civil Service (Medical): No    Lack of Transportation (Non-Medical): No  Physical Activity: Unknown (08/06/2022)   Exercise Vital Sign    Days of Exercise per Week: 0 days    Minutes of Exercise per Session: Not on file  Stress: No Stress Concern Present (08/06/2022)   [employer] of Occupational Health - Occupational Stress Questionnaire    Feeling of Stress : Not at all  Social Connections: Moderately Isolated (10/07/2023)   Social Connection and Isolation Panel    Frequency of Communication with Friends and Family: More than three times a week    Frequency of Social Gatherings with Friends and Family: Once a week    Attends Religious Services: Never    Database administrator or Organizations: No    Attends Engineer, structural: Not on file    Marital Status: Married  Catering manager Violence: Not on file      Modena Callander, M.D. Ph.D.  [facility] [address] [place], [place] [address] Ph: [phone] Fax: [phone]  CC:  Leonce Katz, DO [address] [place],  [place] [address]  Monica Boby CROME, NP-C

## 2023-11-07 NOTE — Procedures (Signed)
 Full Name: Monica Steele Gender: Female MRN #: [id number] Date of Birth: [birth year]    Visit Date: 11/07/2023 08:33 Age: 38 Years Examining Physician: Onita Duos Referring Physician: Lauraine Born Height: 5 feet 1 inch History: 38 year old right-handed female complains of 2 months history of left hand and left lower extremity paresthesia  Summary of the test: Nerve conduction study: Left median sensory responses showed mildly prolonged peak latency with well-preserved snap amplitude.  Left median motor responses were within normal limit.  Left ulnar sensory motor responses were normal.  Left sural, superficial peroneal sensory responses were normal.  Left tibial, peroneal to EDB motor responses were normal.  Bilateral tibial H reflexes were normal and symmetric  Electromyography: Selected needle examinations of left lower extremity muscles lumbosacral paraspinal, left upper extremity muscles and cervical paraspinal muscles were normal.   Conclusion: This is an abnormal study.  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    ------------------------------- Duos Onita. M.D. Ph.D.   [facility] [address], [address] [place], [place] [address] Tel: [phone] Fax: [phone]  Verbal informed consent was obtained from the patient, patient was informed of potential risk of procedure, including bruising, bleeding, hematoma formation, infection, muscle weakness, muscle pain, numbness, among others.        MNC    Nerve / Sites Muscle Latency Ref. Amplitude Ref. Rel Amp Segments Distance Velocity Ref. Area    ms ms mV mV %  cm m/s m/s mVms  L Median - APB     Wrist APB 3.6 <=4.4 10.5 >=4.0 100 Wrist - APB 7   35.1     Upper arm APB 7.3  10.3  98.4 Upper arm - Wrist 21 57 >=49 35.3  L Ulnar - ADM     Wrist ADM 2.0 <=3.3 7.6 >=6.0 100 Wrist - ADM 7   30.3      B.Elbow ADM 4.3  7.7  101 B.Elbow - Wrist 14 62 >=49 29.8     A.Elbow ADM 6.1  7.2  93.4 A.Elbow - B.Elbow 11 60 >=49 30.0  L Peroneal - EDB     Ankle EDB 4.6 <=6.5 6.4 >=2.0 100 Ankle - EDB 9   27.5     Fib head EDB 9.7  6.4  99.6 Fib head - Ankle 25 49 >=44 28.1     Pop fossa EDB 11.5  6.2  97.3 Pop fossa - Fib head 9 49 >=44 27.9         Pop fossa - Ankle      L Tibial - AH     Ankle AH 3.8 <=5.8 10.5 >=4.0 100 Ankle - AH 9   26.6     Pop fossa AH 10.7  10.5  100 Pop fossa - Ankle 38 55 >=41 27.4             SNC    Nerve / Sites Rec. Site Peak Lat Ref.  Amp Ref. Segments Distance    ms ms V V  cm  L Sural - Ankle (Calf)     Calf Ankle 3.7 <=4.4 24 >=6 Calf - Ankle 14  L Superficial peroneal - Ankle     Lat leg Ankle 3.3 <=4.4 12 >=6 Lat leg - Ankle 14  L Median - Orthodromic (Dig II, Mid palm)     Dig II Wrist 3.5 <=3.4 15 >=10 Dig II - Wrist  13  L Ulnar - Orthodromic, (Dig V, Mid palm)     Dig V Wrist 2.4 <=3.1 14 >=5 Dig V - Wrist 2             F  Wave    Nerve F Lat Ref.   ms ms  L Ulnar - ADM 21.7 <=32.0  L Tibial - AH 44.2 <=56.0         H Reflex    Nerve H Lat Lat Hmax   ms ms   Left Right Ref. Left Right Ref.  Tibial - Soleus 33.7 34.8 <=35.0 27.0 27.2 <=35.0         EMG Summary Table    Spontaneous MUAP Recruitment  Muscle IA Fib PSW Fasc Other Amp Dur. Poly Pattern  L. Tibialis anterior Normal None None None _______ Normal Normal Normal Normal  L. Tibialis posterior Normal None None None _______ Normal Normal Normal Normal  L. Gastrocnemius (Medial head) Normal None None None _______ Normal Normal Normal Normal  L. Vastus lateralis Normal None None None _______ Normal Normal Normal Normal  L. Lumbar paraspinals (low) Normal None None None _______ Normal Normal Normal Normal  L. Lumbar paraspinals (mid) Normal None None None _______ Normal Normal Normal Normal  L. First dorsal interosseous Normal None None None _______ Normal Normal Normal Normal  L.  Pronator teres Normal None None None _______ Normal Normal Normal Normal  L. Biceps brachii Normal None None None _______ Normal Normal Normal Normal  L. Deltoid Normal None None None _______ Normal Normal Normal Normal  L. Triceps brachii Normal None None None _______ Normal Normal Normal Normal  L. Cervical paraspinals Normal None None None _______ Normal Normal Normal Normal

## 2023-11-14 ENCOUNTER — Ambulatory Visit: Admitting: Radiology

## 2023-11-20 ENCOUNTER — Ambulatory Visit: Admitting: Radiology

## 2023-11-23 ENCOUNTER — Other Ambulatory Visit: Payer: Self-pay | Admitting: Sports Medicine

## 2023-11-23 ENCOUNTER — Other Ambulatory Visit: Payer: Self-pay | Admitting: Family Medicine

## 2023-12-04 NOTE — Progress Notes (Signed)
 Cardiology Office Note:  .   Date:  12/11/2023  ID:  Monica Steele, DOB [birth year], MRN [id number] PCP: Lendia Boby CROME, NP-C  [facility] Providers Cardiologist:  Stanly DELENA Leavens, MD    History of Present Illness: .   Monica Steele is a 38 y.o. female with history of GERD, palpitations, chest pain, HLD.  Patient seen today with video interpreter initially but then another interpreter came.  Last month when she was breathing she had a sharp pain under her bra line into her chest so made an appt. She says it happens every 5 minutes.  She also says her heart beats fast at times. Works at [employer]. No regular exercise.   ROS:    Studies Reviewed: SABRA         Prior CV Studies:   GXT 12/2022 ECG is normal. ECG rhythm shows normal sinus rhythm.  Stress Findings A Bruce protocol stress test was performed. Exercise capacity was normal. Patient exercised for 9 min and 0 sec. Maximum HR of 166 bpm. MPHR 90.0%. Peak METS 10.4.  The patient experienced no angina during the test. The patient achieved the target heart rate. The patient requested the test to be stopped. The patient reported no symptoms during the stress test. Normal blood pressure and normal heart rate response noted during stress. Heart rate recovery was normal.  Stress ECG No ST deviation was noted. The ECG was negative for ischemia.   Echo IMPRESSIONS     1. Left ventricular ejection fraction, by estimation, is 65 to 70%. Left  ventricular ejection fraction by 3D volume is 66 %. The left ventricle has  normal function. The left ventricle has no regional wall motion  abnormalities. There is mild left  ventricular hypertrophy. Left ventricular diastolic parameters were  normal.   2. Right ventricular systolic function is normal. The right ventricular  size is normal. Tricuspid regurgitation signal is inadequate for assessing  PA pressure.   3. The mitral valve is normal in structure. No evidence of  mitral valve  regurgitation. No evidence of mitral stenosis.   4. The aortic valve is tricuspid. Aortic valve regurgitation is not  visualized. No aortic stenosis is present.   5. The inferior vena cava is normal in size with greater than 50%  respiratory variability, suggesting right atrial pressure of 3 mmHg.    Risk Assessment/Calculations:             Physical Exam:   VS:  BP 110/84   Pulse 78   Ht 5' 1 (1.549 m)   Wt 162 lb 6.4 oz (73.7 kg)   LMP 11/23/2023   SpO2 98%   BMI 30.69 kg/m    Orhtostatics: No data found. Wt Readings from Last 3 Encounters:  12/11/23 162 lb 6.4 oz (73.7 kg)  11/07/23 161 lb (73 kg)  10/09/23 164 lb (74.4 kg)    GEN: Obese, in no acute distress NECK: No JVD; No carotid bruits CARDIAC: RRR, no murmurs, rubs, gallops-pain reproducible to touch RESPIRATORY:  Clear to auscultation without rales, wheezing or rhonchi  ABDOMEN: Soft, non-tender, non-distended EXTREMITIES:  No edema; No deformity   ASSESSMENT AND PLAN: .    Chest Pain - Recurrent chest pain at the bra line. No shortness of breath  Previous cardiac workup was benign.  stress test normal 12/2022 -pain reproducible to touch, suspect M-S chest pain 150 min exercise   Palpitations -metoprolol  to as needed use.    Hyperlipidemia-not  checked since 2021. Will check today.  Obesity-exercise and weight loss.            Dispo: f/u 1 yr  Signed, Olivia Pavy, PA-C

## 2023-12-10 NOTE — Progress Notes (Unsigned)
 [name] D[facility] [address] [place] [address] Phone: [phone]   Assessment and Plan:     1. Carpal tunnel syndrome, left (Primary) 2. Numbness and tingling of left upper and lower extremity -Chronic with exacerbation, subsequent visit - Overall improvements in numbness and tingling into the left wrist and numbness and tingling from left knee distal since previous office visit.  Patient had EMG of left upper and lower extremity that showed mild carpal tunnel syndrome - Patient has had mild relief in the past with meloxicam  course, prednisone  course, HEP, activity modification.  Continue HEP and activity modification - Continue gabapentin  100 mg twice daily for nerve pain - Patient   had unremarkable brain MRI in July 2025   Interpreter used throughout entirety of office visit  Pertinent previous records reviewed include EMG report 11/07/2023, brain MRI 10/09/2023, urgent care note 11/04/2023   Follow Up: 2 months for reevaluation.  Could consider carpal tunnel CSI   Subjective:   I, Claretha Schimke am a scribe for Dr. Leonce.    Chief Complaint: right hip pain    HPI:    09/05/2022 Patient is a 38 year old female complaining of right hip pain. Patient states right hip pain for 3 months, no MOI, radiating down to the right butt cheek, to the leg and has pain when walking,no meds for the pain,     12/06/2022 Patient states her pain has come back the last couple of days. No MOI   04/24/2023 Patient states she is her for left hand. Pain for about 3 weeks. No MOI. Pain started at the elbow and radiated down to her hand and fingers with numbness to 4 and 5. No meds for the pain. Decreased grip strength.  Top of left foot is tingling as well    06/19/2023 Patient states numbness in left arm and left leg and some times the tingling sensation. It going on all day long. Going on for about two months now. When she sits down and starts  moving her leg, that helps sometimes. If she starts moving the arm up and down and massages the arm gently that does help some. Does not effect sleep at night.    08/28/2023 Patient states intermittent pain when forward bending still has left arm numbness   12/11/2023 Patient states that she is doing good. No flare ups in pain this week.    Relevant Historical Information: GER  Additional pertinent review of systems negative.   Current Outpatient Medications:    amitriptyline  (ELAVIL ) 10 MG tablet, TAKE 1 TABLET BY MOUTH EVERYDAY AT BEDTIME, Disp: 90 tablet, Rfl: 3   amoxicillin -clavulanate (AUGMENTIN ) 875-125 MG tablet, Take 1 tablet by mouth 2 (two) times daily., Disp: 20 tablet, Rfl: 0   cetirizine  (ZYRTEC  ALLERGY) 10 MG tablet, Take 1 tablet (10 mg total) by mouth daily., Disp: 90 tablet, Rfl: 0   fluticasone  (FLONASE ) 50 MCG/ACT nasal spray, SPRAY 2 SPRAYS INTO EACH NOSTRIL EVERY DAY, Disp: 16 mL, Rfl: 3   gabapentin  (NEURONTIN ) 100 MG capsule, TAKE 1 CAPSULE BY MOUTH AT BEDTIME., Disp: 30 capsule, Rfl: 1   metoprolol  tartrate (LOPRESSOR ) 25 MG tablet, Take 1 tablet (25 mg total) by mouth as needed., Disp: 90 tablet, Rfl: 1   predniSONE  (DELTASONE ) 20 MG tablet, Take 2 tablets daily with breakfast., Disp: 10 tablet, Rfl: 0   SUMAtriptan  (IMITREX ) 50 MG tablet, Take 1 tablet (50 mg total) by mouth every 2 (two) hours as  needed for migraine. May repeat in 2 hours if headache persists or recurs., Disp: 10 tablet, Rfl: 11   Objective:     Vitals:   12/11/23 1045  BP: 102/70  Pulse: 77  SpO2: 97%  Weight: 158 lb (71.7 kg)  Height: 5' 1 (1.549 m)      Body mass index is 29.85 kg/m.    Physical Exam:    General: Appears well, no acute distress, nontoxic and pleasant Neck: FROM, no pain Neuro: Decreased sensation over left fourth and fifth digits of hand compared to right.  Otherwise, sensation is intact distally with no deficits, strenghth is 5/5 in elbow  flexors/extenders/supinator/pronators and wrist flexors/extensors Psych: no evidence of anxiety or depression   Left elbow: No deformity, swelling or muscle wasting Normal Carrying angle ROM:0-140, supination and pronation 90 NTTP over triceps, ticeps tendon, olecronon, lat epicondyle, medial epicondyle, antecubital fossa, biceps tendon, supinator, pronator Positive tinnels over cubital tunnel Mild pain with resisted wrist and middle digit extension No pain with resisted wrist flexion Mild pain with resisted supination Mild pain with resisted pronation Negative valgus stress Negative varus stress Negative milking maneuver      Electronically signed by:  Odis Mace [facility] 11:52 AM 12/11/23

## 2023-12-11 ENCOUNTER — Ambulatory Visit (INDEPENDENT_AMBULATORY_CARE_PROVIDER_SITE_OTHER): Admitting: Sports Medicine

## 2023-12-11 ENCOUNTER — Ambulatory Visit: Attending: Physician Assistant | Admitting: Physician Assistant

## 2023-12-11 ENCOUNTER — Encounter: Payer: Self-pay | Admitting: Physician Assistant

## 2023-12-11 VITALS — BP 110/84 | HR 78 | Ht 61.0 in | Wt 162.4 lb

## 2023-12-11 VITALS — BP 102/70 | HR 77 | Ht 61.0 in | Wt 158.0 lb

## 2023-12-11 DIAGNOSIS — R079 Chest pain, unspecified: Secondary | ICD-10-CM

## 2023-12-11 DIAGNOSIS — G5602 Carpal tunnel syndrome, left upper limb: Secondary | ICD-10-CM | POA: Diagnosis not present

## 2023-12-11 DIAGNOSIS — R002 Palpitations: Secondary | ICD-10-CM | POA: Diagnosis not present

## 2023-12-11 DIAGNOSIS — E785 Hyperlipidemia, unspecified: Secondary | ICD-10-CM | POA: Diagnosis not present

## 2023-12-11 DIAGNOSIS — R202 Paresthesia of skin: Secondary | ICD-10-CM | POA: Diagnosis not present

## 2023-12-11 DIAGNOSIS — R200 Anesthesia of skin: Secondary | ICD-10-CM | POA: Diagnosis not present

## 2023-12-11 DIAGNOSIS — E66811 Obesity, class 1: Secondary | ICD-10-CM

## 2023-12-11 MED ORDER — GABAPENTIN 100 MG PO CAPS: 100.0000 mg | ORAL_CAPSULE | Freq: Two times a day (BID) | ORAL | 1 refills | Status: DC

## 2023-12-11 MED ORDER — METOPROLOL TARTRATE 25 MG PO TABS: 25.0000 mg | ORAL_TABLET | ORAL | 1 refills | Status: AC | PRN

## 2023-12-11 NOTE — Patient Instructions (Signed)
 Medication Instructions:  START TAKING METOPROLOL  TARTRATE 25 MG AS NEEDED FOR PALPITATIONS.  Lab Work: FASTING LIPID PANEL  Testing/Procedures: NONE  Follow-Up: At [facility], you and your health needs are our priority.  As part of our continuing mission to provide you with exceptional heart care, our providers are all part of one team.  This team includes your primary Cardiologist (physician) and Advanced Practice Providers or APPs (Physician Assistants and Nurse Practitioners) who all work together to provide you with the care you need, when you need it.  Your next appointment:   1 YEAR  Provider:   Stanly DELENA Leavens, MD    We recommend signing up for the patient portal called MyChart.  Sign up information is provided on this After Visit Summary.  MyChart is used to connect with patients for Virtual Visits (Telemedicine).  Patients are able to view lab/test results, encounter notes, upcoming appointments, etc.  Non-urgent messages can be sent to your provider as well.   To learn more about what you can do with MyChart, go to [email or web address].   Other Instructions: PLEASE BE SURE TO EXERCISE AT LEAST 30 MINUTES EACH DAY.

## 2023-12-11 NOTE — Patient Instructions (Signed)
 Gabapentin  100 mg twice daily. Continue Home Exercise Program Follow up in 2 months.

## 2023-12-12 ENCOUNTER — Ambulatory Visit: Payer: Self-pay | Admitting: Physician Assistant

## 2023-12-12 LAB — LIPID PANEL
Chol/HDL Ratio: 3.7 ratio (ref 0.0–4.4)
Cholesterol, Total: 178 mg/dL (ref 100–199)
HDL: 48 mg/dL (ref >39)
LDL Chol Calc (NIH): 111 mg/dL — ABNORMAL HIGH (ref 0–99)
Triglycerides: 102 mg/dL (ref 0–149)
VLDL Cholesterol Cal: 19 mg/dL (ref 5–40)

## 2023-12-21 ENCOUNTER — Other Ambulatory Visit: Payer: Self-pay | Admitting: Neurology

## 2023-12-31 NOTE — Progress Notes (Unsigned)
 [facility] [address] [place] [address] Phone: [phone]   Assessment and Plan:     1. Carpal tunnel syndrome, left (Primary) 2. Numbness and tingling of left upper and lower extremity -Chronic with exacerbation, subsequent visit - Overall significant improvement in numbness and tingling in the left wrist and improvement in tingling in left knee since previous office visit.  Symptoms appear to be resolving with as needed meloxicam , as needed gabapentin , HEP.  Patient is still experiencing mild numbness and tingling over dorsum of left foot that is most likely related to tight clothing/footwear - Use meloxicam  15 mg daily as needed for breakthrough pain.  Recommend limiting chronic NSAIDs to 1-2 doses per week to prevent long-term side effects. Use Tylenol  500 to 1000 mg tablets 2-3 times a day as needed for day-to-day pain relief.    - Continue gabapentin  100 mg twice daily as needed for nerve pain - May use topical Voltaren gel over areas of pain/tingling - Continue HEP - Patient had EMG of left upper and lower extremity previously that showed mild carpal tunnel syndrome but was otherwise unremarkable.  Patient had unremarkable brain MRI in July 2025   Patient accompanied by interpreter throughout entirety of office visit.  Pertinent previous records reviewed include none   Follow Up: As needed.  Could consider CSI versus gabapentin  course versus meloxicam  course   Subjective:   I, Kenith Trickel, am serving as a Neurosurgeon for Doctor Morene Mace  Chief Complaint: right hip pain    HPI:    09/05/2022 Patient is a 38 year old female complaining of right hip pain. Patient states right hip pain for 3 months, no MOI, radiating down to the right butt cheek, to the leg and has pain when walking,no meds for the pain,     12/06/2022 Patient states her pain has come back the last couple of days. No MOI   04/24/2023 Patient  states she is her for left hand. Pain for about 3 weeks. No MOI. Pain started at the elbow and radiated down to her hand and fingers with numbness to 4 and 5. No meds for the pain. Decreased grip strength.  Top of left foot is tingling as well    06/19/2023 Patient states numbness in left arm and left leg and some times the tingling sensation. It going on all day long. Going on for about two months now. When she sits down and starts moving her leg, that helps sometimes. If she starts moving the arm up and down and massages the arm gently that does help some. Does not effect sleep at night.    08/28/2023 Patient states intermittent pain when forward bending still has left arm numbness    12/11/2023 Patient states that she is doing good. No flare ups in pain this week.   01/01/2024 Patient states she is doing well    Relevant Historical Information: GER  Additional pertinent review of systems negative.   Current Outpatient Medications:    amitriptyline  (ELAVIL ) 10 MG tablet, TAKE 1 TABLET BY MOUTH EVERYDAY AT BEDTIME, Disp: 90 tablet, Rfl: 3   cetirizine  (ZYRTEC  ALLERGY) 10 MG tablet, Take 1 tablet (10 mg total) by mouth daily., Disp: 90 tablet, Rfl: 0   fluticasone  (FLONASE ) 50 MCG/ACT nasal spray, SPRAY 2 SPRAYS INTO EACH NOSTRIL EVERY DAY, Disp: 16 mL, Rfl: 3   gabapentin  (NEURONTIN ) 100 MG capsule, Take 1 capsule (100 mg total) by mouth 2 (two) times  daily., Disp: 60 capsule, Rfl: 1   metoprolol  tartrate (LOPRESSOR ) 25 MG tablet, Take 1 tablet (25 mg total) by mouth as needed., Disp: 90 tablet, Rfl: 1   SUMAtriptan  (IMITREX ) 50 MG tablet, TAKE 1 TABLET (50 MG TOTAL) BY MOUTH EVERY 2 (TWO) HOURS AS NEEDED FOR MIGRAINE. MAY REPEAT IN 2 HOURS IF HEADACHE PERSISTS OR RECURS., Disp: 10 tablet, Rfl: 5   Objective:     Vitals:   01/01/24 0949  Pulse: 75  SpO2: 99%  Weight: 161 lb (73 kg)  Height: 5' 1 (1.549 m)      Body mass index is 30.42 kg/m.    Physical Exam:    General:  Appears well, no acute distress, nontoxic and pleasant Neck: FROM, no pain Neuro: Decreased sensation over left fourth and fifth digits of hand compared to right.  Otherwise, sensation is intact distally with no deficits, strenghth is 5/5 in elbow flexors/extenders/supinator/pronators and wrist flexors/extensors Psych: no evidence of anxiety or depression   Left elbow: No deformity, swelling or muscle wasting Normal Carrying angle ROM:0-140, supination and pronation 90 NTTP over triceps, ticeps tendon, olecronon, lat epicondyle, medial epicondyle, antecubital fossa, biceps tendon, supinator, pronator Negative tinnels over cubital tunnel No pain with resisted wrist and middle digit extension No pain with resisted wrist flexion No pain with resisted supination No pain with resisted pronation Negative valgus stress Negative varus stress Negative milking maneuver     Electronically signed by:  Odis Mace D.[facility]MENTE[facility] 10:05 AM 01/01/24

## 2024-01-01 ENCOUNTER — Encounter: Payer: Self-pay | Admitting: Family Medicine

## 2024-01-01 ENCOUNTER — Ambulatory Visit: Admitting: Family Medicine

## 2024-01-01 ENCOUNTER — Other Ambulatory Visit (HOSPITAL_COMMUNITY)
Admission: RE | Admit: 2024-01-01 | Discharge: 2024-01-01 | Disposition: A | Discharge status: Home / Self Care | Source: Ambulatory Visit | Attending: Radiology | Admitting: Radiology

## 2024-01-01 ENCOUNTER — Ambulatory Visit: Admitting: Radiology

## 2024-01-01 ENCOUNTER — Ambulatory Visit (INDEPENDENT_AMBULATORY_CARE_PROVIDER_SITE_OTHER): Admitting: Family Medicine

## 2024-01-01 ENCOUNTER — Encounter: Payer: Self-pay | Admitting: Radiology

## 2024-01-01 ENCOUNTER — Ambulatory Visit: Payer: Self-pay | Admitting: Family Medicine

## 2024-01-01 ENCOUNTER — Ambulatory Visit (INDEPENDENT_AMBULATORY_CARE_PROVIDER_SITE_OTHER): Admitting: Sports Medicine

## 2024-01-01 VITALS — BP 112/84 | HR 70 | Temp 97.8°F | Ht 61.0 in | Wt 161.0 lb

## 2024-01-01 VITALS — BP 116/80 | HR 78 | Ht 61.0 in | Wt 160.0 lb

## 2024-01-01 VITALS — HR 75 | Ht 61.0 in | Wt 161.0 lb

## 2024-01-01 DIAGNOSIS — R202 Paresthesia of skin: Secondary | ICD-10-CM

## 2024-01-01 DIAGNOSIS — Z0001 Encounter for general adult medical examination with abnormal findings: Secondary | ICD-10-CM | POA: Diagnosis not present

## 2024-01-01 DIAGNOSIS — Z1331 Encounter for screening for depression: Secondary | ICD-10-CM

## 2024-01-01 DIAGNOSIS — Z01419 Encounter for gynecological examination (general) (routine) without abnormal findings: Secondary | ICD-10-CM | POA: Diagnosis not present

## 2024-01-01 DIAGNOSIS — G5602 Carpal tunnel syndrome, left upper limb: Secondary | ICD-10-CM

## 2024-01-01 DIAGNOSIS — R200 Anesthesia of skin: Secondary | ICD-10-CM

## 2024-01-01 DIAGNOSIS — Z603 Acculturation difficulty: Secondary | ICD-10-CM

## 2024-01-01 DIAGNOSIS — R5383 Other fatigue: Secondary | ICD-10-CM | POA: Diagnosis not present

## 2024-01-01 DIAGNOSIS — N644 Mastodynia: Secondary | ICD-10-CM | POA: Diagnosis not present

## 2024-01-01 DIAGNOSIS — Z308 Encounter for other contraceptive management: Secondary | ICD-10-CM | POA: Insufficient documentation

## 2024-01-01 DIAGNOSIS — M6281 Muscle weakness (generalized): Secondary | ICD-10-CM | POA: Diagnosis not present

## 2024-01-01 DIAGNOSIS — R300 Dysuria: Secondary | ICD-10-CM | POA: Insufficient documentation

## 2024-01-01 DIAGNOSIS — R42 Dizziness and giddiness: Secondary | ICD-10-CM

## 2024-01-01 DIAGNOSIS — Z23 Encounter for immunization: Secondary | ICD-10-CM | POA: Diagnosis not present

## 2024-01-01 DIAGNOSIS — J309 Allergic rhinitis, unspecified: Secondary | ICD-10-CM | POA: Diagnosis not present

## 2024-01-01 DIAGNOSIS — Z758 Other problems related to medical facilities and other health care: Secondary | ICD-10-CM

## 2024-01-01 DIAGNOSIS — R10A2 Flank pain, left side: Secondary | ICD-10-CM | POA: Insufficient documentation

## 2024-01-01 LAB — COMPREHENSIVE METABOLIC PANEL WITH GFR
ALT: 11 U/L (ref 0–35)
AST: 11 U/L (ref 0–37)
Albumin: 4.6 g/dL (ref 3.5–5.2)
Alkaline Phosphatase: 79 U/L (ref 39–117)
BUN: 14 mg/dL (ref 6–23)
CO2: 27 meq/L (ref 19–32)
Calcium: 9.3 mg/dL (ref 8.4–10.5)
Chloride: 105 meq/L (ref 96–112)
Creatinine, Ser: 0.71 mg/dL (ref 0.40–1.20)
GFR: 107.75 mL/min (ref >60.00)
Glucose, Bld: 96 mg/dL (ref 70–99)
Potassium: 4.1 meq/L (ref 3.5–5.1)
Sodium: 140 meq/L (ref 135–145)
Total Bilirubin: 0.4 mg/dL (ref 0.2–1.2)
Total Protein: 7.6 g/dL (ref 6.0–8.3)

## 2024-01-01 LAB — T4, FREE: Free T4: 0.93 ng/dL (ref 0.60–1.60)

## 2024-01-01 LAB — CK: Total CK: 38 U/L (ref 17–177)

## 2024-01-01 LAB — CBC WITH DIFFERENTIAL/PLATELET
Basophils Absolute: 0 K/uL (ref 0.0–0.1)
Basophils Relative: 0.4 % (ref 0.0–3.0)
Eosinophils Absolute: 0.1 K/uL (ref 0.0–0.7)
Eosinophils Relative: 1.5 % (ref 0.0–5.0)
HCT: 43 % (ref 36.0–46.0)
Hemoglobin: 14.1 g/dL (ref 12.0–15.0)
Lymphocytes Relative: 24 % (ref 12.0–46.0)
Lymphs Abs: 1.9 K/uL (ref 0.7–4.0)
MCHC: 32.9 g/dL (ref 30.0–36.0)
MCV: 86.5 fl (ref 78.0–100.0)
Monocytes Absolute: 0.5 K/uL (ref 0.1–1.0)
Monocytes Relative: 6.4 % (ref 3.0–12.0)
Neutro Abs: 5.4 K/uL (ref 1.4–7.7)
Neutrophils Relative %: 67.7 % (ref 43.0–77.0)
Platelets: 337 K/uL (ref 150.0–400.0)
RBC: 4.97 Mil/uL (ref 3.87–5.11)
RDW: 12.9 % (ref 11.5–15.5)
WBC: 7.9 K/uL (ref 4.0–10.5)

## 2024-01-01 LAB — VITAMIN B12: Vitamin B-12: 578 pg/mL (ref 211–911)

## 2024-01-01 LAB — MAGNESIUM: Magnesium: 2.2 mg/dL (ref 1.5–2.5)

## 2024-01-01 LAB — TSH: TSH: 1.13 u[IU]/mL (ref 0.35–5.50)

## 2024-01-01 NOTE — Patient Instructions (Signed)

## 2024-01-01 NOTE — Progress Notes (Signed)
 Monica Steele [birth year] [id number]   History:  38 y.o. G4P2 presents for annual exam with [place] interpreter. Normal mammogram last year for breast pain in the left side, still experiencing. Worse with caffeine or before her menses.  Gynecologic History Patient's last menstrual period was 12/23/2023 (exact date). Period Cycle (Days): 28 Period Duration (Days): 5-6 Period Pattern: Regular Menstrual Flow: Moderate (passes clots) Menstrual Control: Maxi pad, Thin pad Dysmenorrhea: (!) Moderate Dysmenorrhea Symptoms: Cramping Contraception/Family planning: condoms Sexually active: yes Last Pap: 2022. Results were: normal Last mammogram: 09/12/22. Results were: normal  Obstetric History OB History  Gravida Para Term Preterm AB Living  4 2 2  0 2 2  SAB IAB Ectopic Multiple Live Births  2 0 0 0 2    # Outcome Date GA Lbr Len/2nd Weight Sex Type Anes PTL Lv  4 Term 06/02/21 [redacted]w[redacted]d 05:41 6 lb 9.5 oz (2.99 kg) F Vag-Spont EPI  LIV  3 Term 02/26/13 [redacted]w[redacted]d 15:43 / 00:10 7 lb 1.6 oz (3.221 kg) M Vag-Spont EPI  LIV  2 SAB           1 SAB                01/01/2024    1:33 PM 01/01/2024    9:16 AM 06/19/2023   10:34 AM  Depression screen PHQ 2/9  Decreased Interest 0 0 0  Down, Depressed, Hopeless 0 0 0  PHQ - 2 Score 0 0 0     The following portions of the patient's history were reviewed and updated as appropriate: allergies, current medications, past family history, past medical history, past social history, past surgical history, and problem list.  ROS  Past medical history, past surgical history, family history and social history were all reviewed and documented in the EPIC chart.  Exam:  Vitals:   01/01/24 1330  BP: 116/80  Pulse: 78  SpO2: 99%  Weight: 160 lb (72.6 kg)  Height: 5' 1 (1.549 m)   Body mass index is 30.23 kg/m.  Physical Exam Vitals and nursing note reviewed. Exam conducted with a chaperone present.  Constitutional:      Appearance:  Normal appearance. She is normal weight.  HENT:     Head: Normocephalic and atraumatic.  Neck:     Thyroid : No thyroid  mass, thyromegaly or thyroid  tenderness.  Cardiovascular:     Rate and Rhythm: Regular rhythm.     Heart sounds: Normal heart sounds.  Pulmonary:     Effort: Pulmonary effort is normal.     Breath sounds: Normal breath sounds.  Chest:  Breasts:    Breasts are symmetrical.     Right: Normal. No inverted nipple, mass, nipple discharge, skin change or tenderness.     Left: Tenderness present. No inverted nipple, mass, nipple discharge or skin change.  Abdominal:     General: Abdomen is flat. Bowel sounds are normal.     Palpations: Abdomen is soft.  Genitourinary:    General: Normal vulva.     Vagina: Normal. No vaginal discharge, bleeding or lesions.     Cervix: Normal. No discharge or lesion.     Uterus: Normal. Not enlarged and not tender.      Adnexa: Right adnexa normal and left adnexa normal.       Right: No mass, tenderness or fullness.         Left: No mass, tenderness or fullness.    Lymphadenopathy:     Upper Body:  Right upper body: No axillary adenopathy.     Left upper body: No axillary adenopathy.  Skin:    General: Skin is warm and dry.  Neurological:     Mental Status: She is alert and oriented to person, place, and time.  Psychiatric:        Mood and Affect: Mood normal.        Thought Content: Thought content normal.        Judgment: Judgment normal.      Darice Hoit, CMA present for exam  Assessment/Plan:   1. Well woman exam with routine gynecological exam (Primary) - Cytology - PAP( [place])  2. Breast pain, left Vitamin E 400iu twice daily, avoid caffiene  3. Depression screen      Return in about 1 year (around 12/31/2024) for Annual.  GINETTE COZIER B WHNP-BC 1:52 PM 01/01/2024

## 2024-01-01 NOTE — Progress Notes (Signed)
 Complete physical exam  Patient: Monica Steele   DOB: 14-May-1985   38 y.o. Female  MRN: [id number]  Subjective:    Chief Complaint  Patient presents with   Annual Exam   She is here for a complete physical exam.  Discussed the use of AI scribe software for clinical note transcription with the patient, who gave verbal consent to proceed.  History of Present Illness Monica Steele is a 38 year old female who presents for her annual physical exam.  Vertigo and neurological symptoms - Dizziness and lightheadedness for the past 2-3 weeks - intermittent spinning sensation, even when sitting still - Episodes last 5-10 minutes - No association with positional changes - No nausea or vomiting - Blurry vision during episodes - Weakness in arms and legs, described as feeling 'wobbly' and unable to control them - Sensation of feeling hot during episodes  Headache - Migraine headaches are much less frequent recently - Currently taking amitriptyline  for migraine headaches  Fatigue - Slightly more tired than usual  Allergic symptoms - Nasal congestion, ear pain, and sneezing - Taking over-the-counter allergy medication  Medication use - Metoprolol  prescribed by cardiologist - Gabapentin  for nerve pain - Amitriptyline  for migraine headaches - Medication for constipation    Health Maintenance  Topic Date Due   COVID-19 Vaccine (4 - 2025-26 season) 01/17/2024*   Hepatitis B Vaccine (3 of 3 - 19+ 3-dose series) 02/01/2024   Pap with HPV screening  08/12/2025   Colon Cancer Screening  05/21/2026   DTaP/Tdap/Td vaccine (5 - Td or Tdap) 03/18/2031   Flu Shot  Completed   HPV Vaccine  Completed   Hepatitis C Screening  Completed   HIV Screening  Completed   Pneumococcal Vaccine  Aged Out   Meningitis B Vaccine  Aged Out  *Topic was postponed. The date shown is not the original due date.    Wears seatbelt always, uses sunscreen, smoke detectors in home and  functioning, does not text while driving, feels safe in home environment.  Depression screening:    01/01/2024    1:33 PM 01/01/2024    9:16 AM 06/19/2023   10:34 AM  Depression screen PHQ 2/9  Decreased Interest 0 0 0  Down, Depressed, Hopeless 0 0 0  PHQ - 2 Score 0 0 0   Anxiety Screening:     No data to display           Patient Care Team: Lendia Boby CROME, NP-C as PCP - General (Family Medicine) Santo Stanly LABOR, MD as PCP - Cardiology (Cardiology) Hobart Powell BRAVO, MD (Inactive) as Consulting Physician (Cardiology)   Outpatient Medications Prior to Visit  Medication Sig   amitriptyline  (ELAVIL ) 10 MG tablet TAKE 1 TABLET BY MOUTH EVERYDAY AT BEDTIME   cetirizine  (ZYRTEC  ALLERGY) 10 MG tablet Take 1 tablet (10 mg total) by mouth daily.   fluticasone  (FLONASE ) 50 MCG/ACT nasal spray SPRAY 2 SPRAYS INTO EACH NOSTRIL EVERY DAY   gabapentin  (NEURONTIN ) 100 MG capsule Take 1 capsule (100 mg total) by mouth 2 (two) times daily.   metoprolol  tartrate (LOPRESSOR ) 25 MG tablet Take 1 tablet (25 mg total) by mouth as needed.   SUMAtriptan  (IMITREX ) 50 MG tablet TAKE 1 TABLET (50 MG TOTAL) BY MOUTH EVERY 2 (TWO) HOURS AS NEEDED FOR MIGRAINE. MAY REPEAT IN 2 HOURS IF HEADACHE PERSISTS OR RECURS.   [DISCONTINUED] amoxicillin -clavulanate (AUGMENTIN ) 875-125 MG tablet Take 1 tablet by mouth 2 (two) times daily.   [  DISCONTINUED] predniSONE  (DELTASONE ) 20 MG tablet Take 2 tablets daily with breakfast.   No facility-administered medications prior to visit.    Review of Systems  Constitutional:  Positive for malaise/fatigue. Negative for chills, fever and weight loss.  HENT:  Negative for congestion, ear pain, sinus pain and sore throat.   Eyes:  Negative for blurred vision, double vision and pain.  Respiratory:  Negative for cough, shortness of breath and wheezing.   Cardiovascular:  Negative for chest pain, palpitations and leg swelling.  Gastrointestinal:  Positive for  constipation. Negative for abdominal pain, diarrhea, nausea and vomiting.  Genitourinary:  Negative for dysuria, frequency and urgency.  Musculoskeletal:  Positive for myalgias. Negative for back pain and joint pain.  Skin:  Negative for rash.  Neurological:  Positive for dizziness. Negative for tingling, focal weakness and headaches.  Psychiatric/Behavioral:  Negative for depression and suicidal ideas. The patient is not nervous/anxious.        Objective:    BP 112/84   Pulse 70   Temp 97.8 F (36.6 C) (Temporal)   Ht 5' 1 (1.549 m)   Wt 161 lb (73 kg)   LMP 12/23/2023 (Exact Date)   SpO2 98%   BMI 30.42 kg/m  BP Readings from Last 3 Encounters:  01/01/24 116/80  01/01/24 112/84  12/11/23 102/70   Wt Readings from Last 3 Encounters:  01/01/24 160 lb (72.6 kg)  01/01/24 161 lb (73 kg)  01/01/24 161 lb (73 kg)    Physical Exam Constitutional:      General: She is not in acute distress.    Appearance: She is not ill-appearing.  HENT:     Right Ear: Tympanic membrane, ear canal and external ear normal.     Left Ear: Tympanic membrane, ear canal and external ear normal.     Nose: Nose normal.     Mouth/Throat:     Mouth: Mucous membranes are moist.     Pharynx: Oropharynx is clear.  Eyes:     Extraocular Movements: Extraocular movements intact.     Conjunctiva/sclera: Conjunctivae normal.     Pupils: Pupils are equal, round, and reactive to light.  Neck:     Thyroid : No thyroid  mass, thyromegaly or thyroid  tenderness.  Cardiovascular:     Rate and Rhythm: Normal rate and regular rhythm.     Pulses: Normal pulses.     Heart sounds: Normal heart sounds.  Pulmonary:     Effort: Pulmonary effort is normal.     Breath sounds: Normal breath sounds.  Abdominal:     General: Bowel sounds are normal.     Palpations: Abdomen is soft.     Tenderness: There is no abdominal tenderness. There is no right CVA tenderness, left CVA tenderness, guarding or rebound.   Musculoskeletal:        General: Normal range of motion.     Cervical back: Normal range of motion and neck supple. No tenderness.     Right lower leg: No edema.     Left lower leg: No edema.  Lymphadenopathy:     Cervical: No cervical adenopathy.  Skin:    General: Skin is warm and dry.     Findings: No lesion or rash.  Neurological:     General: No focal deficit present.     Mental Status: She is alert and oriented to person, place, and time.     Cranial Nerves: No cranial nerve deficit.     Sensory: No sensory deficit.  Motor: No weakness.     Gait: Gait normal.  Psychiatric:        Mood and Affect: Mood normal.        Behavior: Behavior normal.        Thought Content: Thought content normal.      Results for orders placed or performed in visit on 01/01/24  CK (Creatine Kinase)  Result Value Ref Range   Total CK 38 17 - 177 U/L  Magnesium   Result Value Ref Range   Magnesium  2.2 1.5 - 2.5 mg/dL  Vitamin A87  Result Value Ref Range   Vitamin B-12 578 211 - 911 pg/mL  T4, free  Result Value Ref Range   Free T4 0.93 0.60 - 1.60 ng/dL  TSH  Result Value Ref Range   TSH 1.13 0.35 - 5.50 uIU/mL  Comprehensive metabolic panel with GFR  Result Value Ref Range   Sodium 140 135 - 145 mEq/L   Potassium 4.1 3.5 - 5.1 mEq/L   Chloride 105 96 - 112 mEq/L   CO2 27 19 - 32 mEq/L   Glucose, Bld 96 70 - 99 mg/dL   BUN 14 6 - 23 mg/dL   Creatinine, Ser 9.28 0.40 - 1.20 mg/dL   Total Bilirubin 0.4 0.2 - 1.2 mg/dL   Alkaline Phosphatase 79 39 - 117 U/L   AST 11 0 - 37 U/L   ALT 11 0 - 35 U/L   Total Protein 7.6 6.0 - 8.3 g/dL   Albumin 4.6 3.5 - 5.2 g/dL   GFR 892.24 >39.99 mL/min   Calcium 9.3 8.4 - 10.5 mg/dL  CBC with Differential/Platelet  Result Value Ref Range   WBC 7.9 4.0 - 10.5 K/uL   RBC 4.97 3.87 - 5.11 Mil/uL   Hemoglobin 14.1 12.0 - 15.0 g/dL   HCT 56.9 63.9 - 53.9 %   MCV 86.5 78.0 - 100.0 fl   MCHC 32.9 30.0 - 36.0 g/dL   RDW 87.0 88.4 - 84.4 %    Platelets 337.0 150.0 - 400.0 K/uL   Neutrophils Relative % 67.7 43.0 - 77.0 %   Lymphocytes Relative 24.0 12.0 - 46.0 %   Monocytes Relative 6.4 3.0 - 12.0 %   Eosinophils Relative 1.5 0.0 - 5.0 %   Basophils Relative 0.4 0.0 - 3.0 %   Neutro Abs 5.4 1.4 - 7.7 K/uL   Lymphs Abs 1.9 0.7 - 4.0 K/uL   Monocytes Absolute 0.5 0.1 - 1.0 K/uL   Eosinophils Absolute 0.1 0.0 - 0.7 K/uL   Basophils Absolute 0.0 0.0 - 0.1 K/uL      Assessment & Plan:    Routine Health Maintenance and Physical Exam Problem List Items Addressed This Visit     Allergic rhinitis   Encounter for general adult medical examination with abnormal findings - Primary   Language barrier   Other Visit Diagnoses       Need for influenza vaccination       Relevant Orders   Flu vaccine trivalent PF, 6mos and older(Flulaval,Afluria,Fluarix,Fluzone) (Completed)     Dizziness       Relevant Orders   CBC with Differential/Platelet (Completed)   Comprehensive metabolic panel with GFR (Completed)   TSH (Completed)   T4, free (Completed)     Fatigue, unspecified type         Muscle weakness (generalized)       Relevant Orders   CBC with Differential/Platelet (Completed)   Comprehensive metabolic panel with GFR (Completed)   TSH (  Completed)   T4, free (Completed)   Vitamin B12 (Completed)   Magnesium  (Completed)   CK (Creatine Kinase) (Completed)       Assessment and Plan Assessment & Plan Adult Wellness Visit Annual physical exam conducted. Up to date on OBGYN visits and immunizations, including flu shot. No new significant findings reported. - Physical examination performed - Order routine blood work   Dizziness and vertigo Intermittent dizziness with a spinning sensation for the past two to three weeks. Episodes last 5-10 minutes. No associated headaches. Vision is somewhat blurry. Possible positional vertigo considered. Allergies and hydration status may contribute to symptoms. - No red flag symptoms  -  Order lab tests to check electrolytes and thyroid  function - Advise to stay well-hydrated - Continue taking allergy medication  Allergic rhinitis Experiencing nasal congestion, ear pain, and sneezing. Currently taking over-the-counter allergy medication. - Continue over-the-counter allergy medication  Constipation Reports mild constipation. Currently using a powder medication for relief. - Advise to stay well-hydrated to help with constipation  Blurry vision Reports somewhat blurry vision. Last eye exam was a long time ago. - Recommend scheduling an eye exam  Language barrier - Medical interpreter on stick used    Return in about 1 year (around 12/31/2024).     Boby Mackintosh, NP-C

## 2024-01-01 NOTE — Patient Instructions (Signed)
-   Use meloxicam  15 mg daily as needed for breakthrough pain.  Recommend limiting chronic NSAIDs to 1-2 doses per week to prevent long-term side effects. Use Tylenol  500 to 1000 mg tablets 2-3 times a day as needed for day-to-day pain relief.    Voltaren gel over areas of pain or numbness   Continue gabapentin  as needed for numbness and tingling   As needed follow up

## 2024-01-03 ENCOUNTER — Ambulatory Visit: Payer: Self-pay | Admitting: Radiology

## 2024-01-03 LAB — CYTOLOGY - PAP
Comment: NEGATIVE
Diagnosis: NEGATIVE
High risk HPV: NEGATIVE

## 2024-01-11 ENCOUNTER — Ambulatory Visit: Admitting: Internal Medicine

## 2024-01-23 ENCOUNTER — Ambulatory Visit: Admitting: Internal Medicine

## 2024-02-05 ENCOUNTER — Ambulatory Visit: Admitting: Sports Medicine

## 2024-02-11 ENCOUNTER — Telehealth: Payer: Self-pay | Admitting: Neurology

## 2024-02-11 NOTE — Telephone Encounter (Signed)
 Patient called to reschedule appointment due to scheduling conflict.  Added to the waitlist.

## 2024-02-12 ENCOUNTER — Ambulatory Visit: Admitting: Sports Medicine

## 2024-03-28 ENCOUNTER — Other Ambulatory Visit: Payer: Self-pay | Admitting: Family Medicine

## 2024-04-08 ENCOUNTER — Encounter: Payer: Self-pay | Admitting: Gastroenterology

## 2024-04-08 ENCOUNTER — Ambulatory Visit (INDEPENDENT_AMBULATORY_CARE_PROVIDER_SITE_OTHER): Admitting: Gastroenterology

## 2024-04-08 VITALS — BP 114/72 | HR 84 | Ht 61.0 in | Wt 162.0 lb

## 2024-04-08 DIAGNOSIS — K5900 Constipation, unspecified: Secondary | ICD-10-CM | POA: Diagnosis not present

## 2024-04-08 DIAGNOSIS — R109 Unspecified abdominal pain: Secondary | ICD-10-CM

## 2024-04-08 DIAGNOSIS — R1084 Generalized abdominal pain: Secondary | ICD-10-CM

## 2024-04-08 DIAGNOSIS — K219 Gastro-esophageal reflux disease without esophagitis: Secondary | ICD-10-CM

## 2024-04-08 MED ORDER — DICYCLOMINE HCL 10 MG PO CAPS: 10.0000 mg | ORAL_CAPSULE | Freq: Two times a day (BID) | ORAL | 5 refills | Status: AC

## 2024-04-08 MED ORDER — ESOMEPRAZOLE MAGNESIUM 40 MG PO CPDR: 40.0000 mg | DELAYED_RELEASE_CAPSULE | Freq: Two times a day (BID) | ORAL | 5 refills | Status: AC

## 2024-04-08 NOTE — Progress Notes (Unsigned)
 "    04/08/2024 Monica Steele [id number] 03/29/1985   HISTORY OF PRESENT ILLNESS:   She previously underwent an extensive GI evaluation by Dr. Eda as summarized below, without identify the etiology for her abdominal pains.    H pylori breath test negative 10/01/18 Barium esophagram 11/07/18: normal  Abdominal ultrasound 03/05/19: several small gallbladder polyps, largest measuring 4mm. Gallbladder appeared normal.  EGD 05/21/19: gastric erosions and chronic gastritis.  There was no H. pylori or intestinal metaplasia. Duodenal and esophageal biopsies were normal. Colonoscopy 05/21/19: tubular adenoma, 2 hyperplastic polyps, nonbleeding internal hemorrhoids    She underwent a CCK HIDA scan  06/05/2022 which showed a gallbladder ejection fraction of 24%. She subsequently underwent a cholecystectomy 08/31/2022 and her upper abdominal pain persisted.    CTAP 12/11/2022 with contrast showed the following: 1. Borderline-mild urinary bladder wall thickening, which may be accentuated by incomplete distention. Correlate with urinalysis to exclude cystitis. 2. Otherwise, no acute abdominopelvic findings. 3. Chronic sclerosis with small subchondral cysts or erosions associated with the bilateral sacroiliac joints, suggesting sequela of sacroiliitis. This has slightly progressed when compared to the prior CT   Past Medical History:  Diagnosis Date   Allergy    Anxiety    Dry eyes    Dysuria 01/01/2024   Encounter for other contraceptive management 01/01/2024   Gallbladder polyp    GERD (gastroesophageal reflux disease)    H/O seasonal allergies    Left flank pain 01/01/2024   Palpitations    Past Surgical History:  Procedure Laterality Date   CHOLECYSTECTOMY N/A 07/31/2022   Procedure: LAPAROSCOPIC CHOLECYSTECTOMY;  Surgeon: Paola Dreama SAILOR, MD;  Location: [facility];  Service: General;  Laterality: N/A;   COLONOSCOPY  2021   ESOPHAGOGASTRODUODENOSCOPY ENDOSCOPY  2021   UPPER  GASTROINTESTINAL ENDOSCOPY      reports that she has never smoked. She has been exposed to tobacco smoke. She has never used smokeless tobacco. She reports that she does not drink alcohol and does not use drugs. family history includes Healthy in her father and mother. Allergies[1]    Outpatient Encounter Medications as of 04/08/2024  Medication Sig   amitriptyline  (ELAVIL ) 10 MG tablet TAKE 1 TABLET BY MOUTH EVERYDAY AT BEDTIME   cetirizine  (ZYRTEC  ALLERGY) 10 MG tablet Take 1 tablet (10 mg total) by mouth daily.   esomeprazole  (NEXIUM ) 40 MG capsule Take 40 mg by mouth 2 (two) times daily.   fluticasone  (FLONASE ) 50 MCG/ACT nasal spray SPRAY 2 SPRAYS INTO EACH NOSTRIL EVERY DAY   gabapentin  (NEURONTIN ) 100 MG capsule Take 1 capsule (100 mg total) by mouth 2 (two) times daily.   metoprolol  tartrate (LOPRESSOR ) 25 MG tablet Take 1 tablet (25 mg total) by mouth as needed.   SUMAtriptan  (IMITREX ) 50 MG tablet TAKE 1 TABLET (50 MG TOTAL) BY MOUTH EVERY 2 (TWO) HOURS AS NEEDED FOR MIGRAINE. MAY REPEAT IN 2 HOURS IF HEADACHE PERSISTS OR RECURS.   No facility-administered encounter medications on file as of 04/08/2024.    REVIEW OF SYSTEMS  : All other systems reviewed and negative except where noted in the History of Present Illness.   PHYSICAL EXAM: BP 114/72   Pulse 84   Ht 5' 1 (1.549 m)   Wt 162 lb (73.5 kg)   LMP 03/23/2024 (Approximate)   BMI 30.61 kg/m  General: Well developed white female in no acute distress Head: Normocephalic and atraumatic Eyes:  sclerae anicteric,conjunctive pink. Ears: Normal auditory acuity Neck: Supple, no masses.  Lungs: Clear throughout  to auscultation Heart: Regular rate and rhythm Abdomen: Soft, nontender, non distended. No masses or hepatomegaly noted. Normal bowel sounds Rectal: *** Musculoskeletal: Symmetrical with no gross deformities  Skin: No lesions on visible extremities Extremities: No edema  Neurological: Alert oriented x 4, grossly  non-focal Psychological:  Alert and cooperative. Normal mood and affect  ASSESSMENT AND PLAN:    CC:  Steele, Monica L, NP-C       [1]  Allergies Allergen Reactions   Gadolinium Derivatives Nausea And Vomiting    Pt vomited after multihance  injection.    "

## 2024-04-08 NOTE — Patient Instructions (Addendum)
 We have sent the following medications to your pharmacy for you to pick up at your convenience: Nexium  40 mg twice daily 30-60 minutes before breakfast and dinner.  Dicyclomine  20 mg twice daily.  Continue Miralax Sharmaine daily.   _______________________________________________________  If your blood pressure at your visit was 140/90 or greater, please contact your primary care physician to follow up on this.  _______________________________________________________  If you are age 39 or older, your body mass index should be between 23-30. Your Body mass index is 30.61 kg/m. If this is out of the aforementioned range listed, please consider follow up with your Primary Care Provider.  If you are age 31 or younger, your body mass index should be between 19-25. Your Body mass index is 30.61 kg/m. If this is out of the aformentioned range listed, please consider follow up with your Primary Care Provider.   ________________________________________________________  The [facility] providers would like to encourage you to use MYCHART to communicate with providers for non-urgent requests or questions.  Due to long hold times on the telephone, sending your provider a message by [facility] may be a faster and more efficient way to get a response.  Please allow 48 business hours for a response.  Please remember that this is for non-urgent requests.  _______________________________________________________  [facility] is using a team-based approach to care.  Your team is made up of your doctor and two to three APPS. Our APPS (Nurse Practitioners and Physician Assistants) work with your physician to ensure care continuity for you. They are fully qualified to address your health concerns and develop a treatment plan. They communicate directly with your gastroenterologist to care for you. Seeing the Advanced Practice Practitioners on your physician's team can help you by facilitating care more  promptly, often allowing for earlier appointments, access to diagnostic testing, procedures, and other specialty referrals.

## 2024-04-09 ENCOUNTER — Telehealth: Payer: Self-pay | Admitting: Internal Medicine

## 2024-04-09 ENCOUNTER — Encounter: Payer: Self-pay | Admitting: Gastroenterology

## 2024-04-09 ENCOUNTER — Other Ambulatory Visit: Payer: Self-pay | Admitting: Sports Medicine

## 2024-04-09 NOTE — Telephone Encounter (Signed)
 Called patient using an interpreter [phone]). Patient stated she has had continues chest pain over the last week. Patient stated it was sharp and she has not done anything strenuous over the last week. Patient stated she has some SOB, and it does hurt to take deep breaths. Informed patient that this sounded like muscular or something with her lungs, and not cardiac related. Since patient's cardiac work-up was normal with testing in 2024 encouraged patient to be seen in urgent care or ER. Patient would like to get in to be seen at cardiology, because she thinks it is a heart issue. Made patient an appointment tomorrow to see DOD. Encouraged patient to go to ED or urgent care if her symptoms get worse. Patient verbalized understanding.

## 2024-04-09 NOTE — Telephone Encounter (Signed)
" °  Per MyChart scheduling message:  Pt c/o of Chest Pain: STAT if active (IN THIS MOMENT) CP, including tightness, pressure, jaw pain, shoulder/upper arm/back pain, SOB, nausea, and vomiting.  1. Are you having CP right now (tightness, pressure, or discomfort)?   2. Are you experiencing any other symptoms (ex. SOB, nausea, vomiting, sweating)?   3. How long have you been experiencing CP?   4. Is your CP continuous or coming and going?   5. Have you taken Nitroglycerin?   6. If CP returns before callback, please consider calling 911. ?  I have a sharp pain in my left chest and painful breathing   number 3. 2 Week Number 4. Coming anh going "

## 2024-04-10 ENCOUNTER — Ambulatory Visit: Attending: Cardiology | Admitting: Cardiology

## 2024-04-10 ENCOUNTER — Encounter: Payer: Self-pay | Admitting: Cardiology

## 2024-04-10 VITALS — BP 114/82 | HR 82 | Ht 61.0 in | Wt 165.2 lb

## 2024-04-10 DIAGNOSIS — E785 Hyperlipidemia, unspecified: Secondary | ICD-10-CM | POA: Insufficient documentation

## 2024-04-10 DIAGNOSIS — R0789 Other chest pain: Secondary | ICD-10-CM | POA: Insufficient documentation

## 2024-04-10 NOTE — Progress Notes (Signed)
 Attending Physician's Attestation   I have reviewed the chart.   I agree with the Advanced Practitioner's note, impression, and recommendations with any updates as below.    Corliss Parish, MD [facility] Advanced [facility]oscopy Office # [phone]

## 2024-04-10 NOTE — Patient Instructions (Addendum)
 Medication Instructions:  Take Advil  or Aleve as needed  Lab Work: None ordered   Testing/Procedures: None ordered  Follow-Up: At [facility], you and your health needs are our priority.  As part of our continuing mission to provide you with exceptional heart care, our providers are all part of one team.  This team includes your primary Cardiologist (physician) and Advanced Practice Providers or APPs (Physician Assistants and Nurse Practitioners) who all work together to provide you with the care you need, when you need it.  Your next appointment:  As Needed    Provider:  Dr.Jordan    We recommend signing up for the patient portal called MyChart.  Sign up information is provided on this After Visit Summary.  MyChart is used to connect with patients for Virtual Visits (Telemedicine).  Patients are able to view lab/test results, encounter notes, upcoming appointments, etc.  Non-urgent messages can be sent to your provider as well.   To learn more about what you can do with MyChart, go to [email or web address].

## 2024-04-10 NOTE — Progress Notes (Signed)
 " Cardiology Office Note   Date:  04/10/2024  ID:  Monica Steele, DOB [birth year], MRN [id number] PCP: Lendia Boby CROME, NP-C  [facility] Providers Cardiologist:  Stanly DELENA Leavens, MD     History of Present Illness Monica Steele Monica Steele is a 39 y.o. female seen as a work in for evaluation of chest pain and SOB. Previously evaluated for similar complaints in 2024. Echo and ETT were normal. Felt that symptoms were noncardiac. She also had prior event monitor which was benign.  She reports intermittent pain in the left parasternal area. Lasts 2-3 minutes. Is sharp. No alleviating or aggravating factors.  ROS: none  Studies Reviewed EKG Interpretation Date/Time:  Thursday April 10 2024 09:34:26 EST Ventricular Rate:  82 PR Interval:  162 QRS Duration:  68 QT Interval:  384 QTC Calculation: 448 R Axis:   61  Text Interpretation: Normal sinus rhythm Normal ECG When compared with ECG of 06-Dec-2022 13:44, No significant change was found Confirmed by Juanpablo Ciresi [phone]) on 04/10/2024 9:35:58 AM   EKG Interpretation Date/Time:  Thursday April 10 2024 09:34:26 EST Ventricular Rate:  82 PR Interval:  162 QRS Duration:  68 QT Interval:  384 QTC Calculation: 448 R Axis:   61  Text Interpretation: Normal sinus rhythm Normal ECG When compared with ECG of 06-Dec-2022 13:44, No significant change was found Confirmed by Jaquesha Boroff [phone]) on 04/10/2024 9:35:58 AM   Event monitor 11/16/20: Study Highlights    No significant abnormalities   9 days of data recorded on monitor. Patient had a min HR of 54 bpm, max HR of 118 bpm, and avg HR of 76 bpm. Predominant underlying rhythm was Sinus Rhythm. No VT, SVT, atrial fibrillation, high degree block, or pauses noted. Isolated atrial and ventricular ectopy was rare (<1%). There were 3 triggered events, which corresponded to sinus rhythm.  No significant arrhythmias detected.    Echo 09/05/22: IMPRESSIONS     1. Left  ventricular ejection fraction, by estimation, is 65 to 70%. Left  ventricular ejection fraction by 3D volume is 66 %. The left ventricle has  normal function. The left ventricle has no regional wall motion  abnormalities. There is mild left  ventricular hypertrophy. Left ventricular diastolic parameters were  normal.   2. Right ventricular systolic function is normal. The right ventricular  size is normal. Tricuspid regurgitation signal is inadequate for assessing  PA pressure.   3. The mitral valve is normal in structure. No evidence of mitral valve  regurgitation. No evidence of mitral stenosis.   4. The aortic valve is tricuspid. Aortic valve regurgitation is not  visualized. No aortic stenosis is present.   5. The inferior vena cava is normal in size with greater than 50%  respiratory variability, suggesting right atrial pressure of 3 mmHg.   ETT 12/26/22: Study Highlights Show Result Comparison    Normal ETT. Negative for ischemia   Stress Findings  Resting ECG ECG is normal. ECG rhythm shows normal sinus rhythm.  Stress Findings A Bruce protocol stress test was performed. Exercise capacity was normal. Patient exercised for 9 min and 0 sec. Maximum HR of 166 bpm. MPHR 90.0%. Peak METS 10.4.  The patient experienced no angina during the test. The patient achieved the target heart rate. The patient requested the test to be stopped. The patient reported no symptoms during the stress test. Normal blood pressure and normal heart rate response noted during stress. Heart rate recovery was normal.  Stress ECG  No ST deviation was noted. The ECG was negative for ischemia.    Risk Assessment/Calculations           Physical Exam VS:  BP 114/82   Pulse 82   Ht 5' 1 (1.549 m)   Wt 165 lb 3.2 oz (74.9 kg)   LMP 03/23/2024 (Approximate)   SpO2 98%   BMI 31.21 kg/m        Wt Readings from Last 3 Encounters:  04/10/24 165 lb 3.2 oz (74.9 kg)  04/08/24 162 lb (73.5 kg)  01/01/24 160 lb  (72.6 kg)    GEN: Well nourished, well developed in no acute distress NECK: No JVD; No carotid bruits CARDIAC: RRR, no murmurs, rubs, gallops RESPIRATORY:  Clear to auscultation without rales, wheezing or rhonchi  Chest mild tenderness to palpation left sternal margin ABDOMEN: Soft, non-tender, non-distended EXTREMITIES:  No edema; No deformity   ASSESSMENT AND PLAN Musculoskeletal chest pain. Prior extensive cardiac evaluation was normal. Recommend prn NSAIDs. Can follow up PRN       Dispo: PRN  Signed, Keltie Labell, MD   "

## 2024-04-15 ENCOUNTER — Ambulatory Visit: Admitting: Neurology

## 2024-04-16 ENCOUNTER — Ambulatory Visit: Admitting: Family Medicine

## 2024-04-16 ENCOUNTER — Other Ambulatory Visit: Payer: Self-pay | Admitting: Sports Medicine

## 2024-05-15 ENCOUNTER — Ambulatory Visit: Admitting: Cardiology

## 2024-06-10 ENCOUNTER — Ambulatory Visit: Admitting: Gastroenterology

## 2024-09-09 ENCOUNTER — Ambulatory Visit: Admitting: Neurology
